# Patient Record
Sex: Male | Born: 1975
Health system: Southern US, Community
[De-identification: ages and names within clinical notes are randomized; demographics above are authoritative.]

## PROBLEM LIST (undated history)

## (undated) DIAGNOSIS — E785 Hyperlipidemia, unspecified: Secondary | ICD-10-CM

## (undated) DIAGNOSIS — I82409 Acute embolism and thrombosis of unspecified deep veins of unspecified lower extremity: Secondary | ICD-10-CM

## (undated) DIAGNOSIS — I824Y9 Acute embolism and thrombosis of unspecified deep veins of unspecified proximal lower extremity: Secondary | ICD-10-CM

## (undated) DIAGNOSIS — D6851 Activated protein C resistance: Secondary | ICD-10-CM

## (undated) DIAGNOSIS — I2510 Atherosclerotic heart disease of native coronary artery without angina pectoris: Secondary | ICD-10-CM

## (undated) DIAGNOSIS — E079 Disorder of thyroid, unspecified: Secondary | ICD-10-CM

## (undated) HISTORY — DX: Acute embolism and thrombosis of unspecified deep veins of unspecified proximal lower extremity: I82.4Y9

## (undated) HISTORY — DX: Activated protein C resistance: D68.51

## (undated) HISTORY — PX: CARDIAC CATHETERIZATION: SHX172

---

## 2011-04-18 ENCOUNTER — Emergency Department (HOSPITAL_COMMUNITY): Payer: Private Health Insurance - Indemnity

## 2011-04-18 ENCOUNTER — Other Ambulatory Visit (HOSPITAL_COMMUNITY): Payer: Self-pay

## 2011-04-18 ENCOUNTER — Emergency Department (HOSPITAL_COMMUNITY)
Admission: EM | Admit: 2011-04-18 | Discharge: 2011-04-19 | Disposition: A | Discharge status: Home / Self Care | Payer: Private Health Insurance - Indemnity | Attending: Emergency Medicine | Admitting: Emergency Medicine

## 2011-04-18 ENCOUNTER — Encounter (HOSPITAL_COMMUNITY): Payer: Self-pay | Admitting: Radiology

## 2011-04-18 DIAGNOSIS — M7989 Other specified soft tissue disorders: Secondary | ICD-10-CM | POA: Insufficient documentation

## 2011-04-18 DIAGNOSIS — M79609 Pain in unspecified limb: Secondary | ICD-10-CM

## 2011-04-18 DIAGNOSIS — R252 Cramp and spasm: Secondary | ICD-10-CM | POA: Insufficient documentation

## 2011-04-18 DIAGNOSIS — R262 Difficulty in walking, not elsewhere classified: Secondary | ICD-10-CM | POA: Insufficient documentation

## 2011-04-18 DIAGNOSIS — D6859 Other primary thrombophilia: Secondary | ICD-10-CM | POA: Insufficient documentation

## 2011-04-18 DIAGNOSIS — Z86718 Personal history of other venous thrombosis and embolism: Secondary | ICD-10-CM | POA: Insufficient documentation

## 2011-04-18 DIAGNOSIS — R0602 Shortness of breath: Secondary | ICD-10-CM | POA: Insufficient documentation

## 2011-04-18 DIAGNOSIS — R0989 Other specified symptoms and signs involving the circulatory and respiratory systems: Secondary | ICD-10-CM | POA: Insufficient documentation

## 2011-04-18 DIAGNOSIS — R0609 Other forms of dyspnea: Secondary | ICD-10-CM | POA: Insufficient documentation

## 2011-04-18 DIAGNOSIS — M25579 Pain in unspecified ankle and joints of unspecified foot: Secondary | ICD-10-CM | POA: Insufficient documentation

## 2011-04-18 HISTORY — DX: Acute embolism and thrombosis of unspecified deep veins of unspecified lower extremity: I82.409

## 2011-04-18 LAB — DIFFERENTIAL
Eosinophils Absolute: 0.1 10*3/uL (ref 0.0–0.7)
Eosinophils Relative: 1 % (ref 0–5)
Lymphs Abs: 2 10*3/uL (ref 0.7–4.0)
Monocytes Absolute: 1.2 10*3/uL — ABNORMAL HIGH (ref 0.1–1.0)
Monocytes Relative: 9 % (ref 3–12)

## 2011-04-18 LAB — POCT I-STAT, CHEM 8
Calcium, Ion: 1.2 mmol/L (ref 1.12–1.32)
HCT: 49 % (ref 39.0–52.0)
TCO2: 24 mmol/L (ref 0–100)

## 2011-04-18 LAB — PROTIME-INR: Prothrombin Time: 13.5 seconds (ref 11.6–15.2)

## 2011-04-18 LAB — CBC
MCH: 30.7 pg (ref 26.0–34.0)
MCV: 86.7 fL (ref 78.0–100.0)
Platelets: 180 10*3/uL (ref 150–400)
RDW: 12.8 % (ref 11.5–15.5)

## 2011-04-18 MED ORDER — IOHEXOL 300 MG/ML  SOLN
100.0000 mL | Freq: Once | INTRAMUSCULAR | Status: AC | PRN
  Administered 2011-04-18: 100 mL via INTRAVENOUS

## 2011-04-20 ENCOUNTER — Inpatient Hospital Stay (HOSPITAL_COMMUNITY)
Admission: EM | Admit: 2011-04-20 | Discharge: 2011-04-26 | DRG: 238 | Disposition: A | Discharge status: Home / Self Care | Payer: Private Health Insurance - Indemnity | Source: Ambulatory Visit | Attending: Family Medicine | Admitting: Family Medicine

## 2011-04-20 ENCOUNTER — Emergency Department (HOSPITAL_COMMUNITY): Payer: Private Health Insurance - Indemnity

## 2011-04-20 DIAGNOSIS — M79609 Pain in unspecified limb: Secondary | ICD-10-CM

## 2011-04-20 DIAGNOSIS — F17200 Nicotine dependence, unspecified, uncomplicated: Secondary | ICD-10-CM | POA: Diagnosis present

## 2011-04-20 DIAGNOSIS — D696 Thrombocytopenia, unspecified: Secondary | ICD-10-CM | POA: Diagnosis not present

## 2011-04-20 DIAGNOSIS — N5089 Other specified disorders of the male genital organs: Secondary | ICD-10-CM | POA: Diagnosis not present

## 2011-04-20 DIAGNOSIS — I824Y9 Acute embolism and thrombosis of unspecified deep veins of unspecified proximal lower extremity: Principal | ICD-10-CM | POA: Diagnosis present

## 2011-04-20 DIAGNOSIS — D62 Acute posthemorrhagic anemia: Secondary | ICD-10-CM | POA: Diagnosis not present

## 2011-04-20 DIAGNOSIS — I82220 Acute embolism and thrombosis of inferior vena cava: Secondary | ICD-10-CM | POA: Diagnosis present

## 2011-04-20 DIAGNOSIS — I871 Compression of vein: Secondary | ICD-10-CM | POA: Diagnosis present

## 2011-04-20 DIAGNOSIS — J9819 Other pulmonary collapse: Secondary | ICD-10-CM | POA: Diagnosis not present

## 2011-04-20 DIAGNOSIS — R58 Hemorrhage, not elsewhere classified: Secondary | ICD-10-CM | POA: Diagnosis not present

## 2011-04-20 DIAGNOSIS — D6859 Other primary thrombophilia: Secondary | ICD-10-CM | POA: Diagnosis present

## 2011-04-20 DIAGNOSIS — R17 Unspecified jaundice: Secondary | ICD-10-CM | POA: Diagnosis not present

## 2011-04-20 DIAGNOSIS — I824Z9 Acute embolism and thrombosis of unspecified deep veins of unspecified distal lower extremity: Secondary | ICD-10-CM

## 2011-04-20 DIAGNOSIS — R7309 Other abnormal glucose: Secondary | ICD-10-CM | POA: Diagnosis present

## 2011-04-20 LAB — MRSA PCR SCREENING: MRSA by PCR: NEGATIVE

## 2011-04-20 LAB — BASIC METABOLIC PANEL
CO2: 25 mEq/L (ref 19–32)
Chloride: 93 mEq/L — ABNORMAL LOW (ref 96–112)
Creatinine, Ser: 0.79 mg/dL (ref 0.50–1.35)
Potassium: 4.7 mEq/L (ref 3.5–5.1)

## 2011-04-20 LAB — CBC
HCT: 37 % — ABNORMAL LOW (ref 39.0–52.0)
MCHC: 35.9 g/dL (ref 30.0–36.0)
MCV: 85.3 fL (ref 78.0–100.0)
RDW: 12.5 % (ref 11.5–15.5)
WBC: 16.3 10*3/uL — ABNORMAL HIGH (ref 4.0–10.5)

## 2011-04-20 LAB — GLUCOSE, CAPILLARY: Glucose-Capillary: 108 mg/dL — ABNORMAL HIGH (ref 70–99)

## 2011-04-20 LAB — APTT: aPTT: 34 seconds (ref 24–37)

## 2011-04-20 MED ORDER — IOHEXOL 300 MG/ML  SOLN
200.0000 mL | Freq: Once | INTRAMUSCULAR | Status: AC | PRN
  Administered 2011-04-20: 80 mL via INTRAVENOUS

## 2011-04-20 NOTE — H&P (Signed)
NAMEJA, OHMAN              ACCOUNT NO.:  [id number]  MEDICAL RECORD NO.:  [id number]  LOCATION:  [facility]                         FACILITY:  [facility]  PHYSICIAN:  Andreas Blower, MD       DATE OF BIRTH:  22-Mar-1976  DATE OF ADMISSION:  04/20/2011 DATE OF DISCHARGE:                             HISTORY & PHYSICAL   PRIMARY CARE PHYSICIAN:  The patient does not have one.  CHIEF COMPLAINT:  Bilateral lower extremity swelling, right greater than left.  HISTORY OF PRESENT ILLNESS:  Mr. Trentin Knappenberger is a 35 year old Caucasian male with history of tobacco use, history of factor V Leiden mutation and May-Thurner syndrome.  He has had a history of stents placed in both of his groin in [place] 6 years ago and has had complicated thrombectomy and/or thrombolysis done in the past.  The patient completed 4 or 92-month course of Coumadin post procedure and subsequently since then has not been on any anticoagulation.  He reports that his most recent symptoms started about 5 days ago.  Initially, he has noted swelling and pain in his lower extremities bilaterally.  He presented to the ER on April 18, 2011 and had ultrasound of lower extremities which did not show DVT.  He also had CT of the chest with contrast, which was negative for CTA and was discharged home.  The patient had gone to [facility] and had ultrasound done today which showed DVT in the common femoral and saphenous veins site.  As a result, he presented back to the ER.  The patient denies any recent fevers or chills.  Denies any nausea or vomiting.  Denies any chest pain or shortness of breath.  Denies any abdominal pain, diarrhea, headaches or vision changes.  REVIEW OF SYSTEMS:  All systems were reviewed with the patient was positive as per HPI, otherwise all other systems are negative.  PAST MEDICAL HISTORY: 1. History of deep venous thrombosis with history of stent placement     bilaterally in the femoral iliac vein and has had  thrombectomy and     thrombolysis in the past. 2. History of factor V Leiden mutation. 3. History of May-Thurner syndrome. 4. History of knee surgery, history of nasal septoplasty.  SOCIAL HISTORY:  The patient reports that he smokes on the weekend socially while drinking alcohol.  Drinks occasional alcohol on the weekends.  Currently, he is working for [employer].  FAMILY HISTORY:  Significant for father and sister having factor V Leiden mutation.  PHYSICAL EXAM:  VITAL SIGNS:  Temperature 98.8, blood pressure 139/80, heart rate 105, respirations 20, satting at 98% on room air. GENERAL:  The patient was alert and oriented, cooperative, in no acute distress, was lying in bed comfortably. HEENT:  Extraocular motions are intact.  Pupils equal and round.  Had moist mucous membranes. NECK:  Supple. HEART:  Regular with S1 and S2. LUNGS:  Clear to auscultation bilaterally. ABDOMEN:  Soft, nontender, nondistended.  Positive bowel sounds. EXTREMITIES:  The patient with good pulses.  Had swelling in lower extremities bilaterally which is more significant around his thighs. Has increased swelling in his right greater than left. NEURO:  Cranial nerves  II-XII grossly intact at 5/5 motor strength in upper as well as lower extremities.  LABS:  Pending at this time.  CBC from April 18, 2011 showed a white count of 12.9, hemoglobin 16.7, hematocrit 49.0, platelet count 180. Electrolytes normal with a BUN 23, creatinine 1.20.  CK was 618.  ASSESSMENT AND PLAN: 1. Deep venous thrombosis.  Imaging from outside source showed DVT in     bilateral common femoral and saphenous vein sites.  Dr. Imogene Burn with     Vascular Surgery is involved and Interventional Radiology is     involved with the patient's care.  We will start the patient on     heparin.  We will put the patient on tele.  Vascular Surgery and/or     Interventional Radiology contemplating further intervention based     on repeat  complete ultrasound which is to be done later today. 2. History of deep venous thrombosis with factor V Leiden mutation,     suspect the patient will most likely need lifelong anticoagulation,     on Coumadin given he has had repeat episodes of deep venous     thrombosis. 3. Leukocytosis, likely reactive leukocytosis. 4. Mild hyperglycemia.  We will send for hemoglobin A1c. 5. Prophylaxis will be on heparin for DVT prophylaxis. 6. Code status.  The patient is full code.   Andreas Blower, MD  Addendum: Repeat ultrasound showed extensive bilateral DVT. Interventional radiology has made arrangements to have the patient be transfered to ICU for closer monitoring.   SR/MEDQ  D:  04/20/2011  T:  04/20/2011  Job:  [id number]  Electronically Signed by Wardell Heath Tonja Jezewski  on 04/20/2011 11:42:30 PM

## 2011-04-21 ENCOUNTER — Inpatient Hospital Stay (HOSPITAL_COMMUNITY): Payer: Private Health Insurance - Indemnity

## 2011-04-21 DIAGNOSIS — I82409 Acute embolism and thrombosis of unspecified deep veins of unspecified lower extremity: Secondary | ICD-10-CM

## 2011-04-21 DIAGNOSIS — I824Z9 Acute embolism and thrombosis of unspecified deep veins of unspecified distal lower extremity: Secondary | ICD-10-CM

## 2011-04-21 LAB — URINE MICROSCOPIC-ADD ON

## 2011-04-21 LAB — CBC
Hemoglobin: 11 g/dL — ABNORMAL LOW (ref 13.0–17.0)
Hemoglobin: 8.8 g/dL — ABNORMAL LOW (ref 13.0–17.0)
MCHC: 34.9 g/dL (ref 30.0–36.0)
MCV: 84.8 fL (ref 78.0–100.0)
Platelets: 86 10*3/uL — ABNORMAL LOW (ref 150–400)
Platelets: 89 10*3/uL — ABNORMAL LOW (ref 150–400)
RBC: 2.82 MIL/uL — ABNORMAL LOW (ref 4.22–5.81)
RDW: 12.6 % (ref 11.5–15.5)
WBC: 14.8 10*3/uL — ABNORMAL HIGH (ref 4.0–10.5)

## 2011-04-21 LAB — PROTIME-INR
INR: 1.74 — ABNORMAL HIGH (ref 0.00–1.49)
Prothrombin Time: 20.7 seconds — ABNORMAL HIGH (ref 11.6–15.2)
Prothrombin Time: 17.4 seconds — ABNORMAL HIGH (ref 11.6–15.2)

## 2011-04-21 LAB — HEPATIC FUNCTION PANEL
ALT: 38 U/L (ref 0–53)
AST: 26 U/L (ref 0–37)
Albumin: 3 g/dL — ABNORMAL LOW (ref 3.5–5.2)
Alkaline Phosphatase: 69 U/L (ref 39–117)
Indirect Bilirubin: 1.6 mg/dL — ABNORMAL HIGH (ref 0.3–0.9)
Total Protein: 6.4 g/dL (ref 6.0–8.3)

## 2011-04-21 LAB — URINALYSIS, ROUTINE W REFLEX MICROSCOPIC
Glucose, UA: NEGATIVE mg/dL
Protein, ur: 100 mg/dL — AB
Specific Gravity, Urine: 1.036 — ABNORMAL HIGH (ref 1.005–1.030)
pH: 6 (ref 5.0–8.0)

## 2011-04-21 LAB — BASIC METABOLIC PANEL
CO2: 26 mEq/L (ref 19–32)
Chloride: 95 mEq/L — ABNORMAL LOW (ref 96–112)
Glucose, Bld: 138 mg/dL — ABNORMAL HIGH (ref 70–99)
Potassium: 4.3 mEq/L (ref 3.5–5.1)
Sodium: 131 mEq/L — ABNORMAL LOW (ref 135–145)

## 2011-04-21 LAB — HEPARIN LEVEL (UNFRACTIONATED)
Heparin Unfractionated: <0.1 IU/mL — ABNORMAL LOW (ref 0.30–0.70)
Heparin Unfractionated: 0.25 IU/mL — ABNORMAL LOW (ref 0.30–0.70)

## 2011-04-21 MED ORDER — IOHEXOL 300 MG/ML  SOLN
200.0000 mL | Freq: Once | INTRAMUSCULAR | Status: AC | PRN
  Administered 2011-04-21: 120 mL via INTRAVENOUS

## 2011-04-21 NOTE — Consult Note (Signed)
Randy Cannon, Randy Cannon              ACCOUNT NO.:  [id number]  MEDICAL RECORD NO.:  [id number]  LOCATION:  2102                         FACILITY:  [facility]  PHYSICIAN:  Fransisco Hertz, MD       DATE OF BIRTH:  May 29, 1976  DATE OF CONSULTATION: DATE OF DISCHARGE:                                CONSULTATION   CONSULTATION REQUESTED BY:  [facility] ER staff.  REASON FOR CONSULTATION:  Bilateral iliofemoral DVT.  HISTORY OF PRESENT ILLNESS:  This is a 35 year old gentleman with prior history of iliofemoral DVT that was treated with lysis and placement of a, by report, bilateral iliac vein stents for treatment of May-Thurner syndrome.  He also has a known history of factor V Leiden.  He underwent a total of about a 4-64-month course of Coumadin and then was subsequently released from anticoagulation by his nephrologist.  He notes onset of pain in the area of his pelvis about Tuesday, and he was seen at the ER, had an ultrasound done which did not include the iliac segments.  This did not demonstrate any deep vein thrombosis. This Wednesday morning, started developing some numbness in his feet. Over the next few days, his symptomatology progressed.  He went in to an urgent care center.  Ultrasound was repeated at that point.  This, at this point, demonstrated DVT.  Subsequently, he was referred back to the ER for evaluation.  Today's ER duplexes are consistent with thrombosis of his common femoral veins bilaterally and superficial veins on both sides.  The patient denies any rest pain symptomatology or symptomatology consistent with intermittent claudication.  He notes that since Wednesday, he has had some slight improvement in the numbness in his right foot.  He is still able to move both feet.  He does not note any other associated symptomatology.  His risk factors for thrombophilia include factor V Leiden and also intermittent smoking.  PAST MEDICAL HISTORY: 1. History of bilateral  iliofemoral DVT. 2. Factor V Leiden. 3. May-Thurner syndrome.  Past surgical history includes, by history, a history of bilateral iliac vein stent placement and thrombolysis of his iliac vein system.  He has also previously had a knee surgery and also nasal septoplasty.  SOCIAL HISTORY:  He intermittently smokes on the weekend socially, also drinks socially.  Denies any illicit drug use.  FAMILY HISTORY:  Father and sister have factor V Leiden.  Otherwise, no other significant family history noted by the patient.  MEDICATIONS:  None.  He has no known drug allergies.  REVIEW OF SYSTEMS:  As above, otherwise noted to be negative.  PHYSICAL EXAMINATION:  VITAL SIGNS:  He had a blood pressure of 138/78, a heart rate of 72, respirations were 12. GENERAL:  Well developed, well nourished, no apparent distress. HEAD:  Normocephalic, atraumatic. ENT:  Hearing is grossly intact.  Oropharynx is without any erythema or exudate.  Nares without any drainage or any erythema.  On eye exam, pupils were equal, round, and reactive to light.  Extraocular movements were intact. NECK:  Supple neck without any nuchal rigidity or JVD. PULMONARY:  Symmetric expansion.  Good air movement.  No rales, rhonchi, or wheezing. CARDIAC:  Regular rate and rhythm.  Normal S1 and S2.  No murmurs, rubs, thrills, or gallops. VASCULAR:  Easily palpable upper extremity pulses, easily palpable carotid pulses without any bruits.  Abdominal aorta pulse could not be palpated.  Bilateral femorals were easily palpable.  I did not attempt to palpate the popliteals as there are currently popliteal vein sheaths in place.  I do not feel a palpable pulse in the right foot.  The left foot has easily palpable dorsalis pedis. ABDOMEN:  Soft abdomen.  Mild bilateral lower quadrant tenderness.  No guarding or rebound.  No hepatosplenomegaly.  Bowel sounds appear to be normal. MUSCULOSKELETAL:  Upper extremity strength was 5/5.   Lower extremities, I did not fully test given the presence of sheaths with lytic catheters in place in the pop space.  He is able to wiggle his toes bilaterally. On extremity examination, his right foot is somewhat cyanotic.  The left is slightly cyanotic also.  Right foot is cooler than the left foot. Both legs are edematous without any change in skin appearance consistent with venous hypertension. NEUROLOGIC:  His cranial nerves II through XII were intact.  Motor strength is listed above.  Sensation is grossly intact in upper extremities and the lower extremities has some decreased light touch in her right leg greater than the left. PSYCHIATRIC:  Judgment is intact.  Mood and affect were appropriate with his clinical situation. DERMATOLOGIC:  I did not appreciate any rashes elsewhere on his bodies. His extremities were as described above. LYMPHATICS:  There was no cervical, axillary, or inguinal lymphadenopathy  noted.  LABORATORY STUDIES:  CBC; white count of 16.3 with an H and H of 13.3 and 37.0, platelet counts of 113.  He had a chemistry; sodium 129, potassium 4.7, chloride 93, bicarb 25, glucose 112, BUN 23, creatinine 0.79.  He had a PTT of 34, PT of 15.1 and INR corresponding of 1.17.  NONINVASIVE VASCULAR IMAGING:  I finalized the report on his bilateral lower extremity venous duplexes.  This demonstrates thrombus throughout his femoral, popliteal, and tibial system with also concomitant thrombosis into his saphenous system.  MEDICAL DECISION MAKING:  This is a 35 year old patient with  bilateral iliofemoral deep vein thrombosis in the setting of thrombophilia and previous possible bilateral iliac stents for May-Thurner syndrome. Currently, he does not have any frank signs of venous gangrene, but does have some evidence of phlegmasia to varying degrees in both legs.  When I was called initially on this patient, I immediately recommended based on his history that he be  evaluated by interventional radiology with the plan for a bilateral popliteal vein cannulation, possible pharmaco- mechanical thrombolysis in both legs, and diagnostic venography to see also if there is a residual venous stenosis on both sides after clot debulking.  He may also need a placement of inferior vena caval filter followed by possible bilateral thrombolytic catheter placement.  Since my initial recommendation, he has gone on to have this procedure done. They elected to proceed with placement of lytic catheter and then tomorrow possible pharmacomechanical thrombolysis.  Currently, the venography I reviewed, it does demonstrate that the renal veins appear to be open and a clot extends to just below this level.  He does have a IVC filter and what also appears to be a possible right gonadal IVC filter placed.  On imaging, I only see clearly a left iliac stent placed which consists of 2 overlapping stents.  I do not clearly see a right iliac stent placement  in this patient.  Given his findings on examination, I agree that aggressive treatment in this patient will be necessary to avoid development of any type of venous gangrene.  My expectation is with additional lysis, he should regain all of his pulses in both feet and the cyanosis should resolve.  An alternative option in this patient would be surgical venous thrombectomy which has fallen out of favor given the significant morbidity related to that procedure.  We discussed the plan briefly with this patient and he is in agreement with such.  We will continue to follow along to provide any surgical support as necessary in this case.  Given that this is this is the patient's second episode of iliofemoral deep vein thrombosis and he has a known history of factor V Leiden, at this point, he will likely require lifelong anticoagulation.  Recommend also involvement of a hematologist to help with long-term management of this patient's  thrombophilia.     Fransisco Hertz, MD     BLC/MEDQ  D:  04/20/2011  T:  04/21/2011  Job:  [id number]  Electronically Signed by Leonides Sake MD on 04/21/2011 07:08:39 AM

## 2011-04-22 ENCOUNTER — Inpatient Hospital Stay (HOSPITAL_COMMUNITY): Payer: Private Health Insurance - Indemnity

## 2011-04-22 DIAGNOSIS — I824Z9 Acute embolism and thrombosis of unspecified deep veins of unspecified distal lower extremity: Secondary | ICD-10-CM

## 2011-04-22 LAB — CBC
HCT: 21.4 % — ABNORMAL LOW (ref 39.0–52.0)
HCT: 20.7 % — ABNORMAL LOW (ref 39.0–52.0)
HCT: 22 % — ABNORMAL LOW (ref 39.0–52.0)
Hemoglobin: 7.8 g/dL — ABNORMAL LOW (ref 13.0–17.0)
Hemoglobin: 7.9 g/dL — ABNORMAL LOW (ref 13.0–17.0)
MCH: 30.6 pg (ref 26.0–34.0)
MCV: 84.1 fL (ref 78.0–100.0)
RBC: 2.55 MIL/uL — ABNORMAL LOW (ref 4.22–5.81)
RBC: 2.46 MIL/uL — ABNORMAL LOW (ref 4.22–5.81)
WBC: 11.1 10*3/uL — ABNORMAL HIGH (ref 4.0–10.5)
WBC: 10.5 10*3/uL (ref 4.0–10.5)

## 2011-04-22 LAB — COMPREHENSIVE METABOLIC PANEL
BUN: 19 mg/dL (ref 6–23)
CO2: 29 mEq/L (ref 19–32)
Chloride: 97 mEq/L (ref 96–112)
Creatinine, Ser: 0.65 mg/dL (ref 0.50–1.35)
GFR calc Af Amer: >60 mL/min (ref >60)
GFR calc non Af Amer: >60 mL/min (ref >60)
Glucose, Bld: 98 mg/dL (ref 70–99)
Total Bilirubin: 0.6 mg/dL (ref 0.3–1.2)

## 2011-04-22 LAB — HEPARIN LEVEL (UNFRACTIONATED)
Heparin Unfractionated: 0.32 IU/mL (ref 0.30–0.70)
Heparin Unfractionated: <0.1 IU/mL — ABNORMAL LOW (ref 0.30–0.70)
Heparin Unfractionated: <0.1 IU/mL — ABNORMAL LOW (ref 0.30–0.70)

## 2011-04-22 LAB — PREPARE RBC (CROSSMATCH)

## 2011-04-22 LAB — ABO/RH: ABO/RH(D): A POS

## 2011-04-22 LAB — PROTIME-INR: Prothrombin Time: 15.2 seconds (ref 11.6–15.2)

## 2011-04-23 ENCOUNTER — Inpatient Hospital Stay (HOSPITAL_COMMUNITY): Payer: Private Health Insurance - Indemnity

## 2011-04-23 DIAGNOSIS — I82409 Acute embolism and thrombosis of unspecified deep veins of unspecified lower extremity: Secondary | ICD-10-CM

## 2011-04-23 LAB — COMPREHENSIVE METABOLIC PANEL
ALT: 30 U/L (ref 0–53)
AST: 24 U/L (ref 0–37)
Albumin: 2.6 g/dL — ABNORMAL LOW (ref 3.5–5.2)
Alkaline Phosphatase: 82 U/L (ref 39–117)
Calcium: 9.1 mg/dL (ref 8.4–10.5)
Glucose, Bld: 89 mg/dL (ref 70–99)
Potassium: 4 mEq/L (ref 3.5–5.1)
Sodium: 138 mEq/L (ref 135–145)
Total Protein: 5.9 g/dL — ABNORMAL LOW (ref 6.0–8.3)

## 2011-04-23 LAB — CROSSMATCH: Unit division: 0

## 2011-04-23 LAB — CBC
HCT: 23.3 % — ABNORMAL LOW (ref 39.0–52.0)
HCT: 25.5 % — ABNORMAL LOW (ref 39.0–52.0)
Hemoglobin: 8.3 g/dL — ABNORMAL LOW (ref 13.0–17.0)
Hemoglobin: 8.4 g/dL — ABNORMAL LOW (ref 13.0–17.0)
Hemoglobin: 9.1 g/dL — ABNORMAL LOW (ref 13.0–17.0)
MCH: 29.7 pg (ref 26.0–34.0)
MCH: 29.9 pg (ref 26.0–34.0)
MCHC: 35.6 g/dL (ref 30.0–36.0)
MCHC: 35.4 g/dL (ref 30.0–36.0)
MCV: 83.9 fL (ref 78.0–100.0)
Platelets: 144 10*3/uL — ABNORMAL LOW (ref 150–400)
Platelets: 187 10*3/uL (ref 150–400)
RBC: 2.83 MIL/uL — ABNORMAL LOW (ref 4.22–5.81)
RBC: 3.04 MIL/uL — ABNORMAL LOW (ref 4.22–5.81)
RDW: 14.2 % (ref 11.5–15.5)
WBC: 9.8 10*3/uL (ref 4.0–10.5)
WBC: 11.2 10*3/uL — ABNORMAL HIGH (ref 4.0–10.5)

## 2011-04-23 LAB — PROTIME-INR
INR: 0.99 (ref 0.00–1.49)
Prothrombin Time: 13.3 seconds (ref 11.6–15.2)

## 2011-04-23 LAB — HEPARIN LEVEL (UNFRACTIONATED): Heparin Unfractionated: <0.1 IU/mL — ABNORMAL LOW (ref 0.30–0.70)

## 2011-04-24 LAB — BASIC METABOLIC PANEL
BUN: 12 mg/dL (ref 6–23)
GFR calc Af Amer: >60 mL/min (ref >60)
GFR calc non Af Amer: >60 mL/min (ref >60)
Potassium: 3.7 mEq/L (ref 3.5–5.1)

## 2011-04-24 LAB — CBC
HCT: 26.6 % — ABNORMAL LOW (ref 39.0–52.0)
MCHC: 34.2 g/dL (ref 30.0–36.0)
Platelets: 183 10*3/uL (ref 150–400)
RDW: 14.4 % (ref 11.5–15.5)
WBC: 11.5 10*3/uL — ABNORMAL HIGH (ref 4.0–10.5)

## 2011-04-24 LAB — URINE CULTURE
Colony Count: NO GROWTH
Culture  Setup Time: 201207230121
Culture: NO GROWTH

## 2011-04-24 LAB — PROTIME-INR: INR: 1.03 (ref 0.00–1.49)

## 2011-04-24 LAB — HEPARIN LEVEL (UNFRACTIONATED): Heparin Unfractionated: 0.32 IU/mL (ref 0.30–0.70)

## 2011-04-25 LAB — BASIC METABOLIC PANEL
CO2: 24 mEq/L (ref 19–32)
Calcium: 9.4 mg/dL (ref 8.4–10.5)
Creatinine, Ser: 0.68 mg/dL (ref 0.50–1.35)
Glucose, Bld: 94 mg/dL (ref 70–99)

## 2011-04-25 LAB — CBC
Hemoglobin: 9.1 g/dL — ABNORMAL LOW (ref 13.0–17.0)
MCH: 28.7 pg (ref 26.0–34.0)
MCHC: 33.7 g/dL (ref 30.0–36.0)
MCV: 85.2 fL (ref 78.0–100.0)
Platelets: 206 10*3/uL (ref 150–400)
RBC: 3.17 MIL/uL — ABNORMAL LOW (ref 4.22–5.81)

## 2011-04-25 LAB — PROTIME-INR
INR: 1.07 (ref 0.00–1.49)
Prothrombin Time: 14.1 seconds (ref 11.6–15.2)

## 2011-04-26 LAB — PROTIME-INR
INR: 1 (ref 0.00–1.49)
Prothrombin Time: 13.4 seconds (ref 11.6–15.2)

## 2011-04-26 LAB — CBC
Hemoglobin: 10 g/dL — ABNORMAL LOW (ref 13.0–17.0)
Platelets: 224 10*3/uL (ref 150–400)
RBC: 3.49 MIL/uL — ABNORMAL LOW (ref 4.22–5.81)
WBC: 9.4 10*3/uL (ref 4.0–10.5)

## 2011-04-26 LAB — HEPARIN ANTI-XA: Heparin LMW: 0.81 IU/mL

## 2011-04-26 LAB — HEPARIN LEVEL (UNFRACTIONATED): Heparin Unfractionated: 0.28 IU/mL — ABNORMAL LOW (ref 0.30–0.70)

## 2011-04-26 NOTE — Discharge Summary (Signed)
  NAMEROSARIO, KUSHNER              ACCOUNT NO.:  [id number]  MEDICAL RECORD NO.:  [id number]  LOCATION:  4507                         FACILITY:  [facility]  PHYSICIAN:  Pleas Koch, MD        DATE OF BIRTH:  [birth year]  DATE OF ADMISSION:  04/20/2011 DATE OF DISCHARGE:  04/26/2011                              DISCHARGE SUMMARY   ADDENDUM:  This is an addendum to dictation #[id number].  DISCHARGE MEDICATIONS: 1. Arixtra 10 mg q.24 hourly, the patient is suppose to be on this for     60 days and as per Dr. Myna Hidalgo. 2. Keflex 500 mg b.i.d. for the next 2 days 4 tablets prescribed. 3. The patient given limited prescription of hydrocodone/APAP 1 tablet     q.6 h. p.r.n. 5 days, 20 tablets prescribed. 4. Discontinued ibuprofen. 5. The patient will continue glucosamine.  The patient was seen from July 24 to April 26, 2011, was doing.  His DVTs were stable and his hemoglobin remained above 99.5 despite being on low heparin.  Heparin was transitioned to Arixtra and the patient was able to give this himself.  He did get preauthorization from Dr. Gustavo Lah office and follow up with a doctor for further care.  The patient was seen on day of discharge, he still having mild abdominal pain but had no other issues.  Temperature 97.3, pulse 80, respirations 20, blood pressure is 127/76, and satting 95% on room.  The patient will follow up with Dr. Myna Hidalgo for further workup to complete empiric treatment of Ancef for two more days 500 b.i.d. and once that is complete, he can discontinue the same, he can continue hydrocodone for abdominal pain secondary to retroperitoneal hematoma and the patient will be reviewed. The patient encouraged to get a PCP.  His chest was clear.  Abdomen is soft, likely tender, lower extremity edema is decreased.          ______________________________ Pleas Koch, MD     JS/MEDQ  D:  04/26/2011  T:  04/26/2011  Job:  [id number]  Electronically Signed by Pleas Koch MD on  04/26/2011 04:45:04 PM

## 2011-05-01 ENCOUNTER — Ambulatory Visit: Payer: Private Health Insurance - Indemnity | Admitting: Hematology & Oncology

## 2011-05-04 NOTE — Progress Notes (Signed)
NAMEQUAN, CYBULSKI              ACCOUNT NO.:  [id number]  MEDICAL RECORD NO.:  [id number]  LOCATION:  3316                         FACILITY:  [facility]  PHYSICIAN:  Osvaldo Shipper, MD     DATE OF BIRTH:  01-Jun-1976                                PROGRESS NOTE   The patient does not have a primary care physician.  CONSULTATIONS DURING THIS HOSPITALIZATION: Include 1. Josph Macho, MD with Hematology. 2. Vascular Surgery Dr. Leonides Sake as well as Dr. Myra Gianotti. 3. Phone discussion with Dr. Lorin Picket McDiarmid, urologist.  Ardine Bjork STUDIES: Imaging studies done so far during this hospitalization include 1. Chest x-ray which showed low lung volumes with bibasilar     atelectasis, right greater than left. 2. CT of the abdomen and pelvis on April 22, 2011, which showed mild     bilateral retroperitoneal hemorrhage, right greater than left. 3. Ultrasound of the scrotum done on April 23, 2011, which showed     edematous scrotal sac, small right epididymal cyst one of which     appears complex, was a area posterior to the epididymal head which     was thought to be edematous spermatic cord rather than a hematoma.     No obvious hematoma was otherwise noted on this ultrasound. 4. Venous Doppler study showed acute DVTs in bilateral lower     extremities basically from the common femoral, femoral-popliteal,     posterior tibial-peroneal, saphenofemoral junction on the right and     then on the left common femoral, femoral-popliteal, saphenofemoral     and greater saphenous vein on the left.  Interestingly on April 12, 2011, he did not have any evidence for DVT.  PERTINENT LABS: Pertinent labs during this hospitalization include a white cell count of 16,000 at presentation increasing to 19,000 and currently is 11,000, hemoglobin went from 13.3-7.4.  PT/INR was stable.  Fibrinogen levels did drop to less than 60 while he was on tenecteplase infusion.  His sodium levels were in 29 which was  corrected.  His UA was remarkably abnormal, however, his urine cultures are negative or showing no growth father.  DISCHARGE DIAGNOSES: 1. Bilateral lower extremity deep venous thrombosis in the setting of     factor V Leiden as well as May-Thurner syndrome. 2. Requires treatment with Arixtra. 3. Acute blood loss anemia, status post transfusion, now stable. 4. Abnormal UA, currently on antibiotics. 5. Atelectasis, on intensive spirometry. 6. Transient thrombocytopenia, improved. 7. Mild jaundice, stable.  BRIEF HOSPITAL COURSE.: 1. Acute bilateral DVTs.  This is a 35 year old Caucasian male with     past medical history of factor V Leiden who had a DVT about 6 years     ago.  The patient was on Coumadin at that time and it was     discontinued after 6 months of treatment.  He was being followed by     hematologist in [place], [place].  He came in with complaints of pain     in his legs and his lower abdomen.  Initial evaluation did not     reveal any DVTs, however, within a couple of days he was found to  have thrombosed veins in his lower extremities.  The patient was     started on heparin.  Because of the extensive disease Vascular     Surgery was consulted.  They deferred to Interventional Radiology.     The patient had catheters placed into the veins by Interventional     Radiology and tenecteplase infusions were given.  Subsequently, he     underwent repeat venogram and angioplasty done by Interventional     Radiology.  He subsequently has TED stockings.  He has been seen by     Dr. Arlan Organ and the plan is for outpatient Arixtra which is     being arranged at this time.  Heparin should be continued through     Wednesday and then Arixtra should be given and hopefully if that is     a range the patient should be able to go home on Thursday. 2. Acute blood loss anemia, because of the tenecteplase, patient's     fibrinogen level dropped, he started having hematuria, his      hemoglobin dropped.  CT revealed acute retroperitoneal bleed.     However, the patient did not become hemodynamically unstable.  He     was given transfusions.  His hemoglobin responded once he was taken     off of the TNK infusion, his blood counts stabilized. 3. Scrotal edema and swelling.  Scrotum started showing bruising and     it started swelling.  We obtained ultrasound of the scrotum and no     obvious hematoma was seen.  I discussed with Dr. McDiarmid,     urologist, yesterday and he said there should not be any long-term     damage.  He asked me to give the patient his contact information to     follow with him as an outpatient. 4. Atelectasis.  He is doing incentive spirometry and he is     ambulating, so that should improve. 5. He had mild thrombocytopenia, which has improved. 6. He had abnormal UA at the time of admission and because of the     elevated white cell count, he was started on ceftriaxone.  His     urine cultures have not shown any growth.  We will change him to     Keflex for three more days orally. 7. He has mild jaundice.  He had mildly elevated bilirubin which has     corrected.  On April 24, 2011, that is today, the patient is feeling well.  He is ambulated.  He has some soreness in his scrotum, otherwise he denies any other discomfort.  VITAL SIGNS:  Are all pretty much stable.  He is afebrile.  His blood pressure is 137/68, saturating 98% on room air.  He is afebrile as I said.  His heart rate is 80-100. LUNGS:  Clear to auscultation bilaterally.  No wheezing, rales or rhonchi. CARDIOVASCULAR:  S1 and S2 is normal and regular.  No S3, S4, rubs, murmurs or bruits. ABDOMEN:  Soft.  There is some tenderness in the lower abdomen.  He has got some bruising in the right flank area. GU:  He has got enlarged scrotum.  Testes appear to be in normal size. No obvious tenderness is present.  No obvious fluid collection is palpable.  He is able to urinate  normally. NEUROLOGIC:  He is alert, oriented x3.  No focal neurological deficits are present.  His hemoglobin this morning is 9.1 which is stable.  INR  is 1.03.  His electrolytes are all normal.  So, essentially the patient is stable.  He can be moved to a non-tele floor.  He will need heparin infusion until Wednesday and then on Thursday he will need to get Arixtra.  Case management is working on this at this time.  Arlan Organ, hematologist is also working on this at this time.  Potentially, the earliest he can be discharged this Thursday, but he may have to wait for Friday depending on how the above arrangements work out.  Discharge medications etc., instructions will be provided at the time of discharge.  Osvaldo Shipper, MD     GK/MEDQ  D:  04/24/2011  T:  04/24/2011  Job:  [id number]  Electronically Signed by Osvaldo Shipper MD on 05/04/2011 06:52:35 AM

## 2011-05-09 ENCOUNTER — Other Ambulatory Visit: Payer: Self-pay | Admitting: Hematology & Oncology

## 2011-05-09 ENCOUNTER — Ambulatory Visit (HOSPITAL_BASED_OUTPATIENT_CLINIC_OR_DEPARTMENT_OTHER): Payer: Private Health Insurance - Indemnity | Admitting: Hematology & Oncology

## 2011-05-09 DIAGNOSIS — I824Y9 Acute embolism and thrombosis of unspecified deep veins of unspecified proximal lower extremity: Secondary | ICD-10-CM

## 2011-05-09 LAB — CBC WITH DIFFERENTIAL (CANCER CENTER ONLY)
BASO%: 0.7 % (ref 0.0–2.0)
EOS%: 2.6 % (ref 0.0–7.0)
LYMPH#: 2 10*3/uL (ref 0.9–3.3)
MCHC: 34.1 g/dL (ref 32.0–35.9)
MONO#: 0.6 10*3/uL (ref 0.1–0.9)
NEUT#: 3.4 10*3/uL (ref 1.5–6.5)
NEUT%: 54.7 % (ref 40.0–80.0)
Platelets: 325 10*3/uL (ref 145–400)
RDW: 14.3 % (ref 11.1–15.7)
WBC: 6.1 10*3/uL (ref 4.0–10.0)

## 2011-05-10 LAB — CHROMOGENIC FACTOR X: CHROM XA: 149.6 % — ABNORMAL HIGH (ref 86–146)

## 2011-05-10 LAB — D-DIMER, QUANTITATIVE: D-Dimer, Quant: 3.07 ug/mL-FEU — ABNORMAL HIGH (ref 0.00–0.48)

## 2011-05-21 NOTE — Consult Note (Signed)
NAMEJAKEL, ALPHIN              ACCOUNT NO.:  [id number]  MEDICAL RECORD NO.:  [id number]  LOCATION:  2102                         FACILITY:  [facility]  PHYSICIAN:  Josph Macho, M.D.  DATE OF BIRTH:  [birth year]  DATE OF CONSULTATION:  04/21/2011 DATE OF DISCHARGE:                                CONSULTATION   REFERRING PHYSICIAN:  Hollice Espy, MD  REASON FOR CONSULTATION: 1. Extensive bilateral iliofemoral DVT. 2. History of factor V Leiden mutation.  HISTORY OF PRESENT ILLNESS:  [name] is a very nice 35 year old white gentleman.  He moved up here in December from [place], who has run [employer].  He presented down in [place] with DVT back in 2006.  He was known to have a May-Thurner syndrome.  He I think had a stent placed to help of with this.  He also was found to have factor 5 Leiden mutation.  Not sure if he is heterozygous or homozygous.  His sister also has factor 5 Leiden mutation.  His father also had factor 5 Leiden mutation.  He underwent about 4-5 months of Coumadin and was then follow supportively.  He again come up here since December.  He is working long hours.  He is to start exercising.  He has not take any kind of supplements.  He does smoke "socially."  He had a Doppler done on April 20, 2011.  The Doppler showed an extensive acute deep or superficial vein thrombosis involved in the right lower extremity.  He also had wound in the left lower extremity.  He did undergo a CT angio of the chest on April 18, 2011.  This was negative.  He was admitted.  On admission, his platelet count was 113,000.  His white cell count 16 and hemoglobin 13.3.  On April 18, 2011, his white count was 12.9, hemoglobin 15.3, hematocrit 43.2, platelet count 180.  He had a markedly elevated creatine kinase level of 618.  He had normal PT and PTT.  BUN and creatinine were 23 and 0.8.  On April 20, 2011, he was started on thrombolytic therapy  by Interventional Radiology.  He was given TNK.  He was seen by Dr. Leonides Sake of Vascular Surgery.  Dr. Imogene Burn, as usual, did a very thorough workup.  He did not feel that there is any indication for surgical intervention.  He agree with the plan for thrombolytic therapy.  On view of his venogram, [name] was noted to have an IVC filter. He was noted to have a left iliac stent.  He did not see a right iliac stent.  [name] has noted improvement already.  His legs did not feels heavier or swollen.  He underwent a venogram today.  Dr. Fredia Sorrow did this.  He was found to have partial clot lysis bilaterally.  He had further decrease in clot burden after extensive AngioJet thrombectomy.  He had a 10-mm and 12-mm balloon angioplasty performed bilaterally at iliac veins.  The final venogram showed antegrade flow bilaterally.  Both iliac veins were opened with mild stenosis.  Again, there were some residual nonocclusive thrombus in the right common femoral veins and iliac  veins.  The TNK therapy was discontinued secondary to drop in his fibrinogen to less than 60.  On his CBC today, his platelet count is down to 86,000, white cell count 19, hemoglobin 11.  Now, we were subsequently asked to see him for evaluation of long-term anticoagulation.  Again, he says he does feel better.  He is back on heparin.  He has had no bleeding.  He has had no cough or shortness of breath.  He has not noticed any abdominal discomfort.  He has had improvement in his leg swelling.  His past medical history is unremarkable outside as the past DVT secondary to the May-Thurner syndrome and factor 5 Leiden mutation.  He has had history of knee surgery, I think with his right knee.  History of nasal septoplasty.  His allergies are none.  His admission medications were none.  SOCIAL HISTORY:  He does have "socially" tobacco use.  He has social alcohol use.  Again, he works full-time for [employer].  FAMILY HISTORY:  Remarkable for father and sister both with factor 5 Leiden mutation.  REVIEW OF SYSTEMS:  As stated in the history of present illness.  PHYSICAL EXAMINATION:  GENERAL:  This is a well-developed, well- nourished, white gentleman in no obvious distress. VITAL SIGNS:  Temperature of 97, pulse is 88, respiratory rate 16, blood pressure 109/59. HEAD AND NECK:  Shows a normocephalic and atraumatic skull.  There are no ocular or oral lesions.  There are no palpable cervical or supraclavicular lymph nodes. LUNGS:  Clear to percussion and auscultation bilaterally.  There are no rales, wheezes, or rhonchi. CARDIAC:  Regular rate and rhythm with normal S1-S2.  There were no murmurs, rubs, or bruits. ABDOMEN:  Soft and good bowel sounds.  There was no palpable abdominal mass.  There was no fluid wave.  There was no palpable hepatosplenomegaly. EXTREMITIES:  Shows bilateral leg edema.  He has about 1 to 2+ nonpitting edema of his lower legs bilaterally.  He has no joint swelling.  He has good range of motion of his joints.  He has good pulses in his distal extremities bilaterally. SKIN:  Exam shows no rashes, ecchymoses, or petechia. NEUROLOGIC:  Shows no focal neurological deficits.  IMPRESSION:  [name] is a 35 year old gentleman with factor 5 Leiden mutation.  He has extensive bilateral iliofemoral deep venous thromboses.  He has history of May-Thurner syndrome.  He clearly is going to need lifelong anticoagulation.  My concern, however, he is at significant risk for developing postphlebitic syndrome because of this extensive thrombus.  I really believe that he needs a therapeutic anticoagulation with a true anticoagulant for a prolonged period of time.  I think Coumadin just is not adequate in this current situation right now.  I really believe that if we guideline the Coumadin as his only form of anticoagulation, that he would develop a  postphlebitic syndrome.  I have noted that his platelet count has dropped.  This should be highly unusual for heparin-induced thrombocytopenia.  I think we are going to have to watch this closely, however.  I believe that as an outpatient, he would do well with Arixtra.  I think this would be manageable by him.  I believe this would be much more effective means of continuing to thin out his thrombi.  Then, this would help decrease his risk of postphlebitic syndrome.  Long-term, we could consider one of the new direct thrombin inhibitors (i.e. Pradaxa) or factor 10 inhibitors (  i.e. Xarelto).  These probably would be more active than Coumadin long-term.  Unfortunately, the FDA has not yet approved these for deep venous thrombosis, although trials have clearly shown their effectiveness in direct comparison to Coumadin in patients who have had deep venous thromboses.  Again, I think quality of life is going to be a critically issue here. Postphlebitic syndrome for [name] would really compromise his quality of life.  As such, I think we need to be aggressive with anticoagulation right now.  He will need bilateral thigh-high compression stockings.  These need to be fitted by Physical Therapy or Occupational Therapy to help minimize risk for postphlebitic syndrome.  We will certainly follow along.     Josph Macho, M.D.     PRE/MEDQ  D:  04/21/2011  T:  04/21/2011  Job:  [id number]  cc:   Hollice Espy, M.D. Fransisco Hertz, MD Jodi Marble. Fredia Sorrow, M.D.  Electronically Signed by Arlan Organ  on 05/21/2011 07:19:49 AM

## 2011-05-28 ENCOUNTER — Ambulatory Visit (HOSPITAL_BASED_OUTPATIENT_CLINIC_OR_DEPARTMENT_OTHER)
Admission: RE | Admit: 2011-05-28 | Discharge: 2011-05-28 | Disposition: A | Discharge status: Home / Self Care | Payer: Private Health Insurance - Indemnity | Source: Ambulatory Visit | Attending: Hematology & Oncology | Admitting: Hematology & Oncology

## 2011-05-28 DIAGNOSIS — I824Y9 Acute embolism and thrombosis of unspecified deep veins of unspecified proximal lower extremity: Secondary | ICD-10-CM | POA: Insufficient documentation

## 2011-06-01 ENCOUNTER — Encounter (HOSPITAL_BASED_OUTPATIENT_CLINIC_OR_DEPARTMENT_OTHER): Payer: Private Health Insurance - Indemnity | Admitting: Hematology & Oncology

## 2011-06-01 DIAGNOSIS — I824Y9 Acute embolism and thrombosis of unspecified deep veins of unspecified proximal lower extremity: Secondary | ICD-10-CM

## 2011-07-13 ENCOUNTER — Encounter (HOSPITAL_BASED_OUTPATIENT_CLINIC_OR_DEPARTMENT_OTHER): Payer: Private Health Insurance - Indemnity | Admitting: Hematology & Oncology

## 2011-07-13 DIAGNOSIS — Z7901 Long term (current) use of anticoagulants: Secondary | ICD-10-CM

## 2011-07-13 DIAGNOSIS — Z86718 Personal history of other venous thrombosis and embolism: Secondary | ICD-10-CM

## 2011-09-07 ENCOUNTER — Other Ambulatory Visit: Payer: Self-pay | Admitting: Hematology & Oncology

## 2011-09-07 ENCOUNTER — Other Ambulatory Visit (HOSPITAL_BASED_OUTPATIENT_CLINIC_OR_DEPARTMENT_OTHER): Payer: Private Health Insurance - Indemnity | Admitting: Lab

## 2011-09-07 ENCOUNTER — Ambulatory Visit (HOSPITAL_BASED_OUTPATIENT_CLINIC_OR_DEPARTMENT_OTHER): Payer: Private Health Insurance - Indemnity | Admitting: Hematology & Oncology

## 2011-09-07 ENCOUNTER — Encounter: Payer: Self-pay | Admitting: Hematology & Oncology

## 2011-09-07 VITALS — BP 127/82 | HR 57 | Temp 97.8°F | Ht 71.0 in | Wt 220.0 lb

## 2011-09-07 DIAGNOSIS — D6851 Activated protein C resistance: Secondary | ICD-10-CM | POA: Insufficient documentation

## 2011-09-07 DIAGNOSIS — D6859 Other primary thrombophilia: Secondary | ICD-10-CM

## 2011-09-07 DIAGNOSIS — I824Y9 Acute embolism and thrombosis of unspecified deep veins of unspecified proximal lower extremity: Secondary | ICD-10-CM | POA: Insufficient documentation

## 2011-09-07 DIAGNOSIS — I82429 Acute embolism and thrombosis of unspecified iliac vein: Secondary | ICD-10-CM

## 2011-09-07 DIAGNOSIS — I82409 Acute embolism and thrombosis of unspecified deep veins of unspecified lower extremity: Secondary | ICD-10-CM

## 2011-09-07 HISTORY — DX: Acute embolism and thrombosis of unspecified deep veins of unspecified proximal lower extremity: I82.4Y9

## 2011-09-07 HISTORY — DX: Activated protein C resistance: D68.51

## 2011-09-07 LAB — D-DIMER, QUANTITATIVE: D-Dimer, Quant: 0.23 ug/mL-FEU (ref 0.00–0.48)

## 2011-09-07 LAB — CBC WITH DIFFERENTIAL (CANCER CENTER ONLY)
BASO%: 0.3 % (ref 0.0–2.0)
LYMPH%: 40.9 % (ref 14.0–48.0)
MCV: 83 fL (ref 82–98)
MONO#: 0.6 10*3/uL (ref 0.1–0.9)
NEUT#: 2.8 10*3/uL (ref 1.5–6.5)
Platelets: 220 10*3/uL (ref 145–400)
RDW: 13.2 % (ref 11.1–15.7)
WBC: 6 10*3/uL (ref 4.0–10.0)

## 2011-09-07 NOTE — Progress Notes (Signed)
This office note has been dictated.

## 2011-09-07 NOTE — Progress Notes (Signed)
DIAGNOSES: 1. Bilateral iliofemoral deep venous thromboses . 2. Heterozygous factor V Leiden mutation.  CURRENT THERAPY:  Xarelto 20 mg p.o. daily.  INTERIM HISTORY:  [name] comes in for followup.  He had a good Thanksgiving.  He had to go up to [place] state.  He did stop every couple of hours just to walk around.  He is wearing the compression stockings on his legs.  He is working without difficulties.  He is going to go to [place] in February for business.  I think this would be okay for him.  He is doing well on the Xarelto.  There has been no bleeding.  He has had no problems with abdominal pain.  There is no cough or shortness breath.  He denies any kind of chest pain.  He says that the numbness in his right foot has resolved.  On his last Doppler test, which was, I think, back in October, there was no DVT in his legs.  PHYSICAL EXAMINATION:  General Appearance:  This is a well-developed, well-nourished, white gentleman in no obvious distress.  Vital Signs: 97.8, pulse 57, respiratory rate 20, blood pressure 127/82.  Weight is 220.  Head and Neck Exam:  A normocephalic, atraumatic skull.  There are no ocular or oral lesions.  There are no palpable cervical or supraclavicular lymph nodes.  Lungs:  Clear to percussion and auscultation bilaterally.  Cardiac Exam:  Regular rate and rhythm with a normal S1 and S2.  There are no murmurs, rubs, or bruits.  Abdominal Exam:  Soft with good bowel sounds.  There is no palpable abdominal mass.  There is no fluid wave.  There is no palpable hepatosplenomegaly. Extremities:  No clubbing, cyanosis, or edema.  He has good range of motion of his joints.  No venous cord is noted in his lower legs.  He has a negative Homan's sign.  Back Exam:  No tenderness over the spine, ribs, or hips.  Skin Exam:  No rashes, ecchymosis, or petechia.  LABORATORY STUDIES:  White cell count 6, hemoglobin 16 hematocrit 45, platelet count 220.  MCV is  83.  IMPRESSION:  [name] is a nice, 35 year old gentleman with a history of recurrent deep venous thrombosis.  This was a serous deep venous thrombosis.  He is heterozygous for the factor V Leiden mutation. Of note, his father recently had a thrombotic event.  Apparently, he is doing well on the Xarelto.  I really think Xarelto is a great choice for him given his lifestyle and work.  Again, I do not see any problem with him going to [place].  I think that he needs to wear his compression stockings on his legs during his trip. He does need to get up every couple of hours to walk around.  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  I want to see Mr. Rosalio Macadamia in about 3 months' time now.  I do not think we need any blood work or x-ray studies in between visits.    ______________________________ Josph Macho, M.D. PRE/MEDQ  D:  09/07/2011  T:  09/07/2011  Job:  [id number]  ADDENDUM:  D-Dimer is 0.23.

## 2011-09-17 LAB — CHROMOGENIC FACTOR X: CHROM XA: 142.4 % (ref 86–146)

## 2011-12-10 ENCOUNTER — Other Ambulatory Visit (HOSPITAL_BASED_OUTPATIENT_CLINIC_OR_DEPARTMENT_OTHER): Payer: Private Health Insurance - Indemnity | Admitting: Lab

## 2011-12-10 ENCOUNTER — Ambulatory Visit (HOSPITAL_BASED_OUTPATIENT_CLINIC_OR_DEPARTMENT_OTHER): Payer: Private Health Insurance - Indemnity | Admitting: Hematology & Oncology

## 2011-12-10 VITALS — BP 125/82 | HR 59 | Temp 98.3°F | Ht 71.0 in | Wt 223.0 lb

## 2011-12-10 DIAGNOSIS — I82429 Acute embolism and thrombosis of unspecified iliac vein: Secondary | ICD-10-CM

## 2011-12-10 DIAGNOSIS — D6859 Other primary thrombophilia: Secondary | ICD-10-CM

## 2011-12-10 DIAGNOSIS — I824Y9 Acute embolism and thrombosis of unspecified deep veins of unspecified proximal lower extremity: Secondary | ICD-10-CM

## 2011-12-10 DIAGNOSIS — D6851 Activated protein C resistance: Secondary | ICD-10-CM

## 2011-12-10 LAB — CBC WITH DIFFERENTIAL (CANCER CENTER ONLY)
EOS%: 2.3 % (ref 0.0–7.0)
Eosinophils Absolute: 0.1 10*3/uL (ref 0.0–0.5)
MCH: 30.5 pg (ref 28.0–33.4)
MCHC: 35.5 g/dL (ref 32.0–35.9)
MONO%: 10.2 % (ref 0.0–13.0)
NEUT#: 2.8 10*3/uL (ref 1.5–6.5)
Platelets: 225 10*3/uL (ref 145–400)
RBC: 5.22 10*6/uL (ref 4.20–5.70)

## 2011-12-10 LAB — D-DIMER, QUANTITATIVE: D-Dimer, Quant: 0.22 ug/mL-FEU (ref 0.00–0.48)

## 2011-12-10 NOTE — Progress Notes (Signed)
DIAGNOSES: 1. Bilateral iliofemoral deep venous thrombosis. 2. Factor V Leiden mutation-heterozygous.  CURRENT THERAPY: 1. Xarelto 20 mg p.o. daily.  INTERIM HISTORY:  [name] comes in for his followup.  He really looks great.  He is working for [employer] He is traveling.  He is really having a great time with them.  He is really back to his prior status.  He is not having swelling in his legs.  He is exercising.  He is eating what he wants.  The Xarelto clearly has been a huge benefit for him.  When we last saw him, we checked his D-dimer.  This was 0.23 back in December.  He has had no bleeding.  He has had no change in bowel or bladder habits.  There has been no cough.  There has been no chest pain.  PHYSICAL EXAM:  General: This is a well-developed, well-nourished, white gentleman in no obvious distress.  Vital signs: Show a temperature of 98.3, pulse 59, respiratory 18, blood pressure 125/82, and weight is 223.  Head and neck exam shows a normocephalic, atraumatic skull.  There are no ocular are no ocular or oral lesions.  There are no palpable cervical or supraclavicular lymph nodes.  Lungs are clear bilaterally. Cardiac examination:  Regular rate and rhythm with a normal S1, S2. There are no murmurs, rubs, or bruits.  Abdominal exam: Soft with good bowel sounds.  There is no palpable abdominal mass.  There is no fluid wave.  There is no palpable hepatosplenomegaly.  Extremities: Shows no clubbing, cyanosis, or edema.  There is no palpable venous cord in his legs.  He has good range of motion of his joints.  He has good pulses in his distal extremities.  Skin exam: No rashes, ecchymosis, or petechia.  LABORATORY STUDIES:  White cell count 6.2, hemoglobin 16, hematocrit 45, and platelet count 225.  IMPRESSION:  [name] is a 36 year old gentleman with a heterozygous Factor V Leiden mutation.  He has done well.  He is on Xarelto.  He will be on Xarelto for  long-term purposes.  He does do a lot of traveling.  I told him to make sure that he does get up and walk around when he does travel. I also told him to wear  his compression stockings when he does travel, particularly when he goes by car.  I think we get him back in 3 more months for followup.    ______________________________ Josph Macho, M.D. PRE/MEDQ  D:  12/10/2011  T:  12/10/2011  Job:  [id number]

## 2011-12-10 NOTE — Progress Notes (Signed)
This office note has been dictated.

## 2011-12-13 ENCOUNTER — Telehealth: Payer: Self-pay | Admitting: *Deleted

## 2011-12-13 NOTE — Telephone Encounter (Signed)
Message copied by Anselm Jungling on Thu Dec 13, 2011 12:28 PM ------      Message from: Arlan Organ R      Created: Mon Dec 10, 2011  6:39 PM       Call- labs look great!! The clotting study that I use shows that his blood is nice and thin!!!!  Cindee Lame

## 2011-12-13 NOTE — Telephone Encounter (Signed)
Called patient and left message to tell him that his labwork looked great.  The clotting study Dr. Lupita Leash uses shows his blood is nice and thin per dr. Myna Hidalgo.  Called this message to his personal voice mail.

## 2012-02-07 IMAGING — US US EXTREM LOW VENOUS BILAT
1 series · 13 of 24 positions shown · non-contrast
Comparison: Bilateral lower extremity venogram/intervention -
04/21/2011

CLINICAL DATA: Evaluate bilateral iliofemoral DVT seen on prior
venogram; Post left iliac vein stenting and IVC filter placement,
Factor 5 Vollmer, currently taking Arixtra

BILATERAL LOWER EXTREMITY VENOUS DUPLEX ULTRASOUND
TECHNIQUE: Gray-scale sonography with graded compression, as well
as color Doppler and duplex ultrasound, were performed to evaluate
the deep venous system of both lower extremities from the level of
the common femoral vein through the popliteal and proximal calf
veins.  Spectral Doppler was utilized to evaluate flow at rest and
with distal augmentation maneuvers.

[Series 1: us extrem low venous bilat · 13 of 47 slices shown]
[im 1/47]
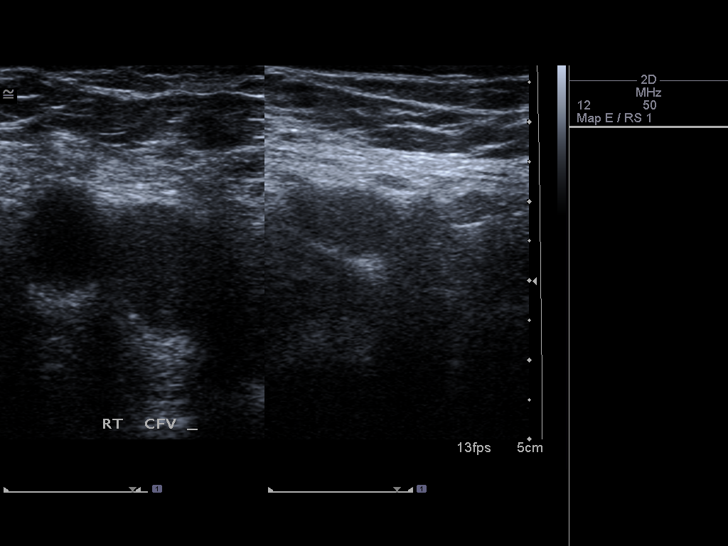
[im 5/47]
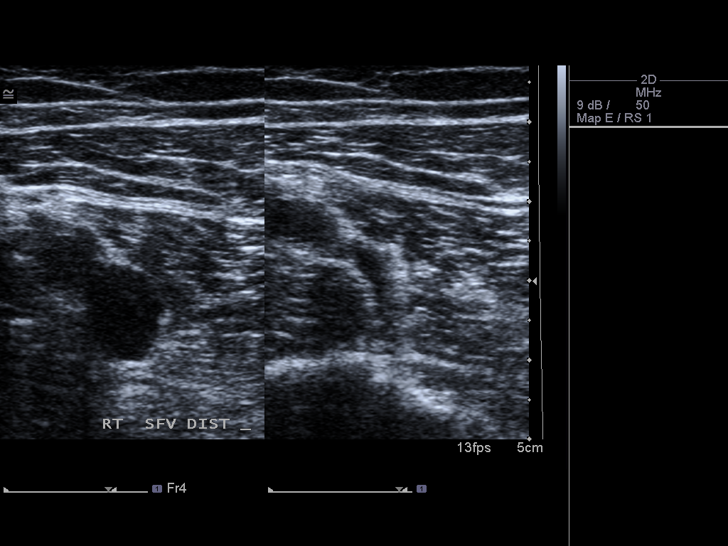
[im 9/47]
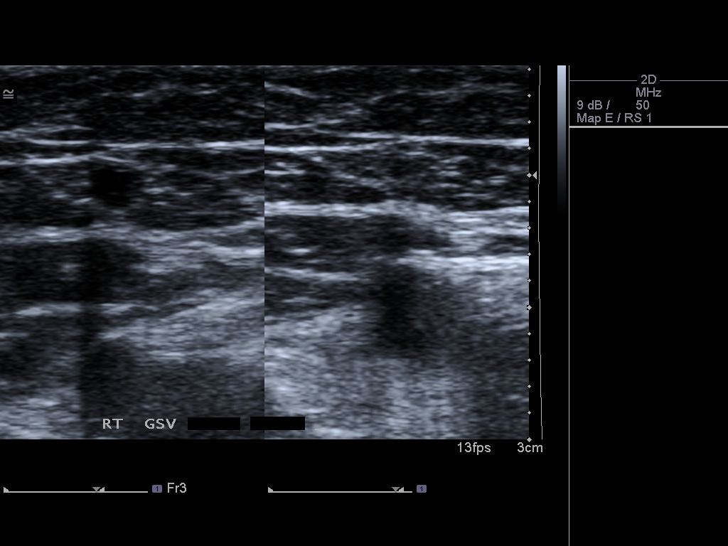
[im 13/47]
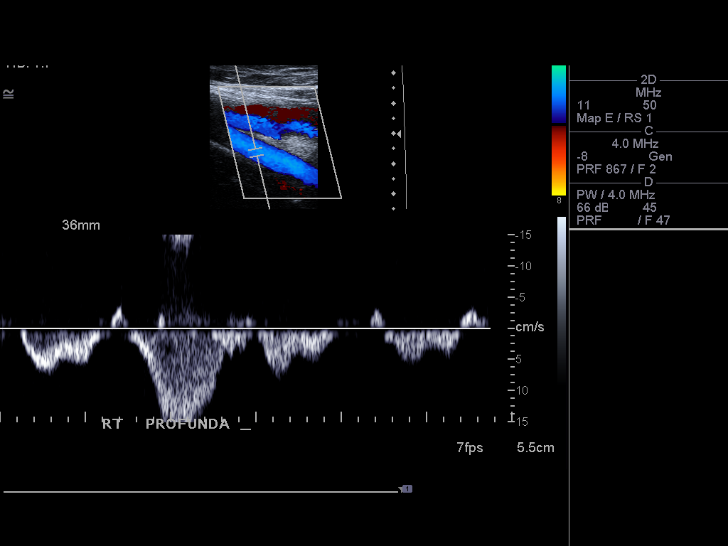
[im 17/47]
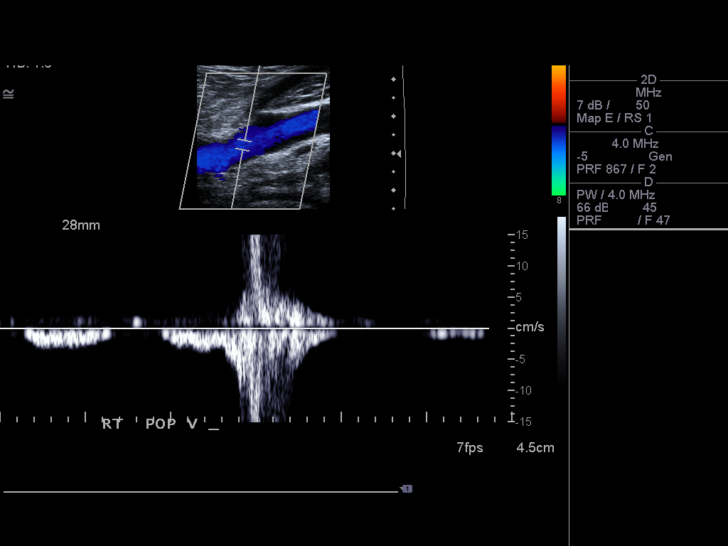
[im 21/47]
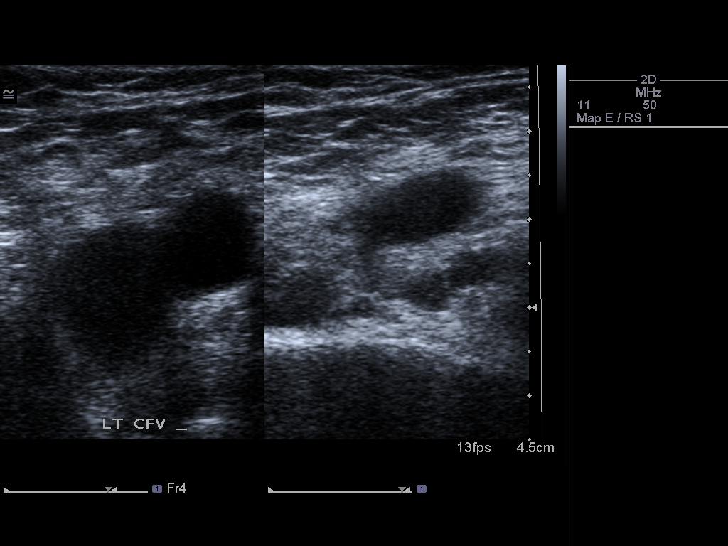
[im 25/47]
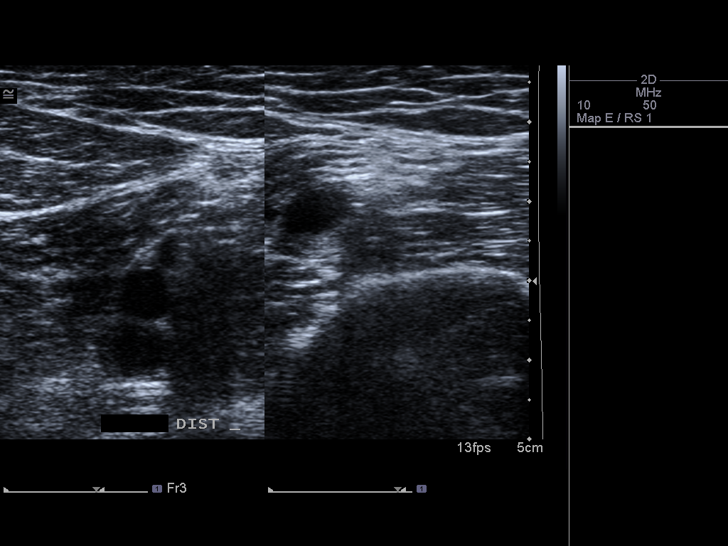
[im 27/47]
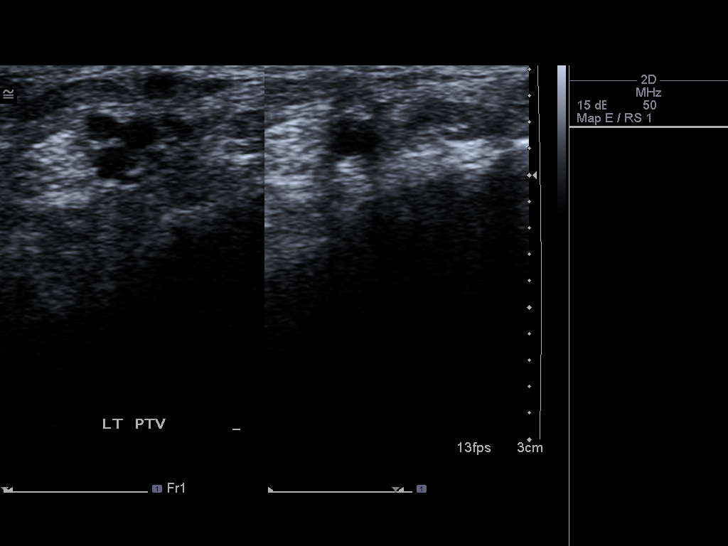
[im 31/47]
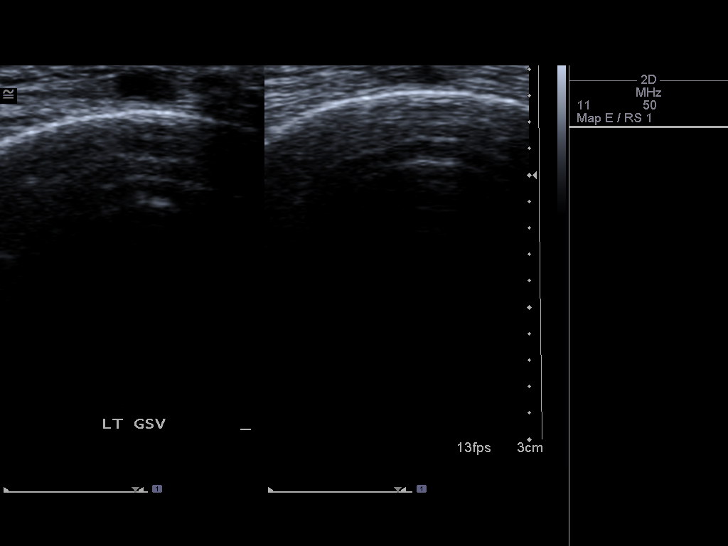
[im 35/47]
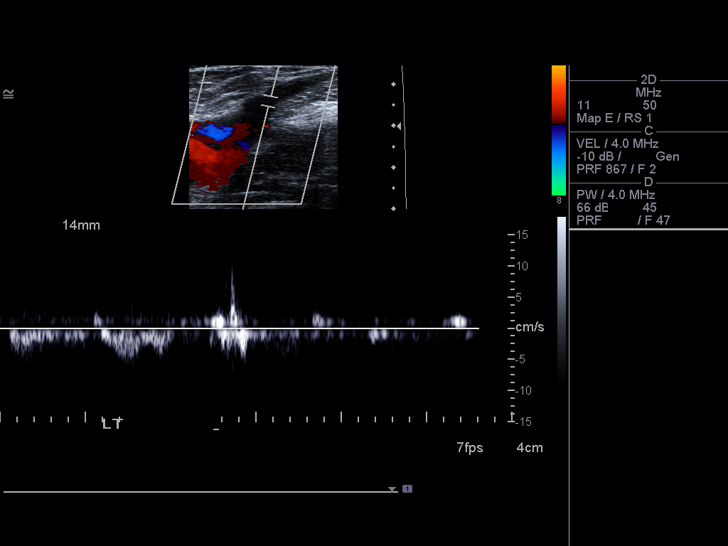
[im 39/47]
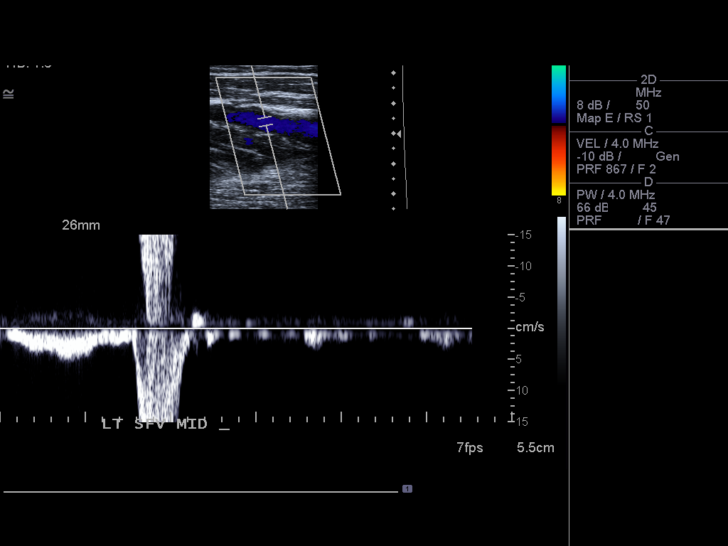
[im 43/47]
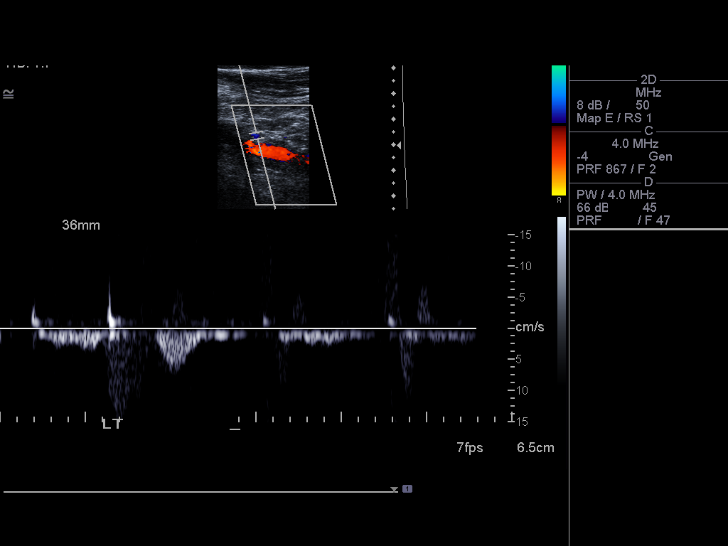
[im 47/47]
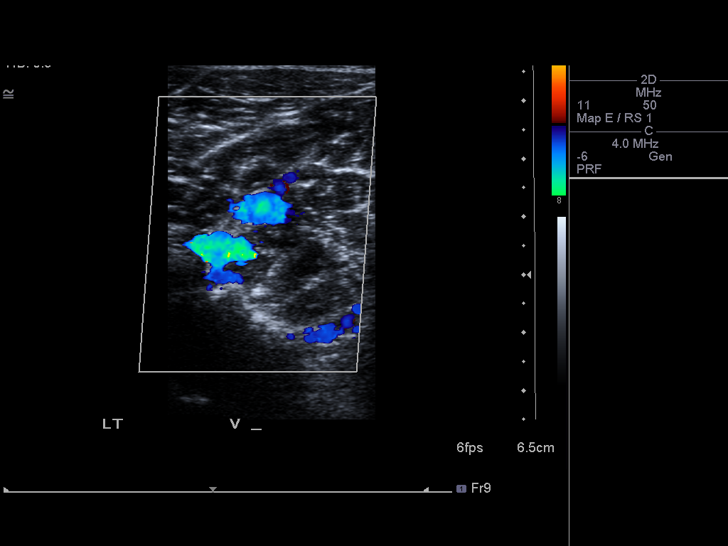

[13 of 24 positions shown; findings below may reference images not displayed]

FINDINGS: Normal compressibility of bilateral common femoral,
superficial femoral, and popliteal veins is demonstrated, as well
as the visualized proximal calf veins.  No filling defects to
suggest DVT on grayscale or color Doppler imaging.  There is no
definite areas of wall thickening to suggest chronic DVT.  Doppler
waveforms show normal direction of venous flow, normal respiratory
phasicity and response to augmentation.
IMPRESSION: No evidence of acute or definite evidence of chronic deep vein
thrombosis within either lower extremity.

## 2012-03-19 ENCOUNTER — Telehealth: Payer: Self-pay | Admitting: Hematology & Oncology

## 2012-03-19 ENCOUNTER — Other Ambulatory Visit (HOSPITAL_BASED_OUTPATIENT_CLINIC_OR_DEPARTMENT_OTHER): Payer: Private Health Insurance - Indemnity | Admitting: Lab

## 2012-03-19 ENCOUNTER — Ambulatory Visit (HOSPITAL_BASED_OUTPATIENT_CLINIC_OR_DEPARTMENT_OTHER): Payer: Private Health Insurance - Indemnity | Admitting: Hematology & Oncology

## 2012-03-19 VITALS — BP 127/79 | HR 54 | Temp 97.2°F | Ht 72.0 in | Wt 224.0 lb

## 2012-03-19 DIAGNOSIS — I82409 Acute embolism and thrombosis of unspecified deep veins of unspecified lower extremity: Secondary | ICD-10-CM

## 2012-03-19 DIAGNOSIS — I824Y9 Acute embolism and thrombosis of unspecified deep veins of unspecified proximal lower extremity: Secondary | ICD-10-CM

## 2012-03-19 LAB — CBC WITH DIFFERENTIAL (CANCER CENTER ONLY)
BASO%: 0.7 % (ref 0.0–2.0)
EOS%: 2.4 % (ref 0.0–7.0)
LYMPH#: 2.2 10*3/uL (ref 0.9–3.3)
MCHC: 35.8 g/dL (ref 32.0–35.9)
NEUT#: 2.9 10*3/uL (ref 1.5–6.5)
Platelets: 219 10*3/uL (ref 145–400)
RDW: 12.8 % (ref 11.1–15.7)

## 2012-03-19 LAB — D-DIMER, QUANTITATIVE: D-Dimer, Quant: 0.32 ug/mL-FEU (ref 0.00–0.48)

## 2012-03-19 NOTE — Telephone Encounter (Signed)
Mailed September schedule °

## 2012-03-19 NOTE — Progress Notes (Signed)
This office note has been dictated.

## 2012-03-20 NOTE — Progress Notes (Signed)
CC:   Randy Cannon. Randy Cannon, M.D.  DIAGNOSES: 1. Bilateral iliofemoral deep vein thrombosis (DVT). 2. Factor V Leiden mutation, heterozygous.  CURRENT THERAPY:  Xarelto 20 mg p.o. daily.  INTERIM HISTORY:  Randy Cannon comes in for followup.  He is still doing well.  He is only complaining of occasional discomfort in his, I think, right leg.  This is a more so when he walks a while.  He says there is a little bit of numbness in the lateral foot.  He says his goes away once he slows down and rests. He just bought a house.  He and his girlfriend will be moving in soon.  He has had no problems with bleeding or bruising.  He has had no abdominal pain.  He has had no cough.  There has been no shortness of breath.  He has had no change in bowel or bladder habits.  His last D-dimer was 0.22 back in March.  PHYSICAL EXAMINATION:  This is a well-developed, well-nourished white gentleman in no obvious distress.  Vital Signs: 97.2, pulse 54, respiratory rate 20, blood pressure 127/79.  Weight is 224.  Head and neck:  Normocephalic, atraumatic skull.  There are no ocular or oral lesions.  There are no palpable cervical or supraclavicular lymph nodes. Lungs:  Clear bilaterally.  Cardiac:  Regular rate and rhythm with a normal S1 and S2.  There are no murmurs, rubs or bruits.  Abdomen:  Soft with good bowel sounds.  There is no palpable abdominal mass.  There is no fluid wave.  There is no palpable hepatosplenomegaly.  Back; There is no tenderness over the spine, ribs, or hips.  Extremities:  No clubbing, cyanosis or edema.  No palpable venous cords noted in his legs.  He has good pulses in his distal extremities.  Neurological:  No focal neurological deficits.  LABORATORY STUDIES:  White cell count is 6, hemoglobin 15.7, hematocrit 43.9, platelet count 219.  IMPRESSION:  Randy Cannon is a 36 year old gentleman with a bilateral iliofemoral deep venous thrombosis.  This was a 2nd episode for  him.  He does have the factor V Leiden mutation.  Of note, his father also has history of DVT.  His father also has a factor V Leiden mutation.  Randy Cannon is doing well on the Xarelto. We will continue him on Xarelto.  He will be on Xarelto long-term.  I will see about getting Randy Cannon to see Randy Cannon for general doctor purposes.  Randy Cannon needs to have a general internist.  I think Dr. Eloise Cannon would be a good fit for him.  I do not see that we need to do any Dopplers on Randy Cannon right now.  I will plan to get him back to see me in another 3 months.    ______________________________ Josph Cannon, M.D. PRE/MEDQ  D:  03/19/2012  T:  03/20/2012  Job:  [id number]

## 2012-06-19 ENCOUNTER — Ambulatory Visit: Payer: Private Health Insurance - Indemnity | Admitting: Hematology & Oncology

## 2012-06-19 ENCOUNTER — Other Ambulatory Visit: Payer: Private Health Insurance - Indemnity | Admitting: Lab

## 2012-06-23 ENCOUNTER — Telehealth: Payer: Self-pay | Admitting: Hematology & Oncology

## 2012-06-23 NOTE — Telephone Encounter (Signed)
Pt made 10-2 from missed 9-19

## 2012-07-02 ENCOUNTER — Other Ambulatory Visit (HOSPITAL_BASED_OUTPATIENT_CLINIC_OR_DEPARTMENT_OTHER): Payer: Private Health Insurance - Indemnity | Admitting: Lab

## 2012-07-02 ENCOUNTER — Ambulatory Visit (HOSPITAL_BASED_OUTPATIENT_CLINIC_OR_DEPARTMENT_OTHER): Payer: Private Health Insurance - Indemnity | Admitting: Hematology & Oncology

## 2012-07-02 VITALS — BP 126/80 | HR 71 | Temp 98.0°F | Resp 18 | Ht 70.0 in | Wt 222.0 lb

## 2012-07-02 DIAGNOSIS — I82409 Acute embolism and thrombosis of unspecified deep veins of unspecified lower extremity: Secondary | ICD-10-CM

## 2012-07-02 DIAGNOSIS — I824Y9 Acute embolism and thrombosis of unspecified deep veins of unspecified proximal lower extremity: Secondary | ICD-10-CM

## 2012-07-02 DIAGNOSIS — Z7901 Long term (current) use of anticoagulants: Secondary | ICD-10-CM

## 2012-07-02 DIAGNOSIS — D6859 Other primary thrombophilia: Secondary | ICD-10-CM

## 2012-07-02 LAB — CBC WITH DIFFERENTIAL (CANCER CENTER ONLY)
BASO#: 0 10*3/uL (ref 0.0–0.2)
EOS%: 2.7 % (ref 0.0–7.0)
Eosinophils Absolute: 0.2 10*3/uL (ref 0.0–0.5)
HGB: 15.4 g/dL (ref 13.0–17.1)
LYMPH#: 2.1 10*3/uL (ref 0.9–3.3)
MCHC: 35.6 g/dL (ref 32.0–35.9)
NEUT#: 3.4 10*3/uL (ref 1.5–6.5)
RBC: 5.04 10*6/uL (ref 4.20–5.70)

## 2012-07-02 NOTE — Progress Notes (Signed)
This office note has been dictated.

## 2012-07-03 NOTE — Progress Notes (Signed)
CC:   Randy Cannon. [name], M.D.  DIAGNOSES: 1. Bilateral iliofemoral deep vein thrombosis.  2.  Heterozygous     factor V Leiden mutation.  CURRENT THERAPY:  Xarelto 20 mg p.o. daily, lifelong.  INTERIM HISTORY:  [name] came in for his followup.  He __________ last week which is why he missed his appointment.  He is doing quite well.  He says he really does not have the numbness in his left foot.  He does have a little bit of pain in his right knee.  He says he hurt the knee while he was down [place] the beach [place] some [place].  He does not think that he needs to see an orthopedic surgeon.  He is working without any difficulties.  He has not had any problems with leg pain.  He has had no bleeding.  He has had no cough or shortness breath.  He has had no headache.  PHYSICAL EXAMINATION:  General:  This is a well-developed, well- nourished white gentleman in no obvious distress.  Vital Signs:  98, pulse, respiratory rate 18, blood pressure 126/80.  Weight is 222.  Head and Neck:  Normocephalic, atraumatic skull.  There are no ocular or oral lesions.  There are no palpable cervical or supraclavicular lymph nodes. Lungs:  Clear bilaterally.  Cardiac:  Regular rate and rhythm with a normal S1, S2.  There are no murmurs, rubs, or bruits.  Abdomen:  Soft with good bowel sounds.  There is no palpable abdominal mass.  There is no fluid wave.  There is no palpable hepatosplenomegaly.  Back:  No tenderness over the spine, ribs, or hips.  Extremities:  No clubbing, cyanosis, or edema.  He has no palpable venous cord in his legs.  He has good pulses in his distal extremities.  There may be a little bit of tenderness with respect to his right knee.  There has been no swelling that I could tell with his right knee.  Skin:  No rashes, ecchymosis, or petechia.  LABORATORY STUDIES:  White cell count 6.4, hemoglobin 13.4, hematocrit 43.2, platelet count 235.  IMPRESSION:  [name] is a  36 year old gentleman with factor V Leiden mutation.  Again, he is heterozygous for this.  He has had bilateral iliofemoral deep vein thromboses.  He is on lifelong anticoagulation.  He has done well with this.  He still has not seen a general internist.  I again referred to Dr. Dossie Arbour.  I gave Dr. Silvano Rusk phone number.  I still do not see any indication for a Doppler of his legs as he is asymptomatic and his last Dopplers were normal.  I will plan to get him back to see [place] after the holidays now.  I think we can get him through the holidays.    ______________________________ Josph Macho, M.D. PRE/MEDQ  D:  07/02/2012  T:  07/03/2012  Job:  [id number]

## 2012-07-25 ENCOUNTER — Other Ambulatory Visit: Payer: Self-pay | Admitting: *Deleted

## 2012-07-25 DIAGNOSIS — D6851 Activated protein C resistance: Secondary | ICD-10-CM

## 2012-07-25 DIAGNOSIS — I824Y9 Acute embolism and thrombosis of unspecified deep veins of unspecified proximal lower extremity: Secondary | ICD-10-CM

## 2012-07-25 DIAGNOSIS — I82429 Acute embolism and thrombosis of unspecified iliac vein: Secondary | ICD-10-CM

## 2012-07-25 MED ORDER — RIVAROXABAN 10 MG PO TABS: 10.0000 mg | ORAL_TABLET | Freq: Every day | ORAL | Status: DC

## 2012-07-25 NOTE — Telephone Encounter (Signed)
Received refill request from [facility] for Xarelto 20 mg. This is a chronic, lifelong medication for the pt per Dr Myna Hidalgo. Refilled via e-prescribe.

## 2012-11-06 ENCOUNTER — Other Ambulatory Visit: Payer: Private Health Insurance - Indemnity | Admitting: Lab

## 2012-11-06 ENCOUNTER — Ambulatory Visit: Payer: Private Health Insurance - Indemnity | Admitting: Hematology & Oncology

## 2012-11-07 ENCOUNTER — Telehealth: Payer: Self-pay | Admitting: Hematology & Oncology

## 2012-11-07 NOTE — Telephone Encounter (Signed)
Pt will call back to reschedule missed 2-6 appointment. He called answering service left message on Tuesday we did not get message

## 2013-05-06 ENCOUNTER — Telehealth: Payer: Self-pay | Admitting: Hematology & Oncology

## 2013-05-06 NOTE — Telephone Encounter (Signed)
Pt made 8-27 appointment from 2-6 no show

## 2013-05-27 ENCOUNTER — Ambulatory Visit (HOSPITAL_BASED_OUTPATIENT_CLINIC_OR_DEPARTMENT_OTHER): Payer: Private Health Insurance - Indemnity | Admitting: Lab

## 2013-05-27 ENCOUNTER — Ambulatory Visit (HOSPITAL_BASED_OUTPATIENT_CLINIC_OR_DEPARTMENT_OTHER): Payer: Private Health Insurance - Indemnity | Admitting: Hematology & Oncology

## 2013-05-27 DIAGNOSIS — D6851 Activated protein C resistance: Secondary | ICD-10-CM

## 2013-05-27 DIAGNOSIS — Z7901 Long term (current) use of anticoagulants: Secondary | ICD-10-CM

## 2013-05-27 DIAGNOSIS — D6859 Other primary thrombophilia: Secondary | ICD-10-CM

## 2013-05-27 DIAGNOSIS — I824Y9 Acute embolism and thrombosis of unspecified deep veins of unspecified proximal lower extremity: Secondary | ICD-10-CM

## 2013-05-27 LAB — CBC WITH DIFFERENTIAL (CANCER CENTER ONLY)
BASO%: 0.4 % (ref 0.0–2.0)
HCT: 43.2 % (ref 38.7–49.9)
LYMPH#: 2.5 10*3/uL (ref 0.9–3.3)
MONO#: 0.6 10*3/uL (ref 0.1–0.9)
NEUT#: 3.9 10*3/uL (ref 1.5–6.5)
NEUT%: 54.3 % (ref 40.0–80.0)
RDW: 12.3 % (ref 11.1–15.7)
WBC: 7.3 10*3/uL (ref 4.0–10.0)

## 2013-05-27 MED ORDER — RIVAROXABAN 20 MG PO TABS: 20.0000 mg | ORAL_TABLET | Freq: Every day | ORAL | Status: DC

## 2013-05-27 NOTE — Progress Notes (Signed)
This office note has been dictated.

## 2013-05-28 LAB — D-DIMER, QUANTITATIVE: D-Dimer, Quant: 0.27 ug/mL-FEU (ref 0.00–0.48)

## 2013-05-28 NOTE — Progress Notes (Signed)
CC:   Randy Cannon. Randy Cannon, M.D.  DIAGNOSES: 1. Bilateral iliofemoral deep vein thromboses. 2. Factor V Leiden mutation, heterozygous.  CURRENT THERAPY:  Xarelto 20 mg p.o. daily, lifelong.  INTERIM HISTORY:  [name] comes in for his followup.  He is doing well.  He has had no problems with the Xarelto.  He has had no problems with leg swelling.  There has been no bleeding or bruising.  He has been working out.  He is watching what he eats.  He has not yet been able to see Dr. Eloise Cannon.  I will call the Dr. Eloise Cannon myself to try to get him in to be seen.  He has not noted any problems with leg swelling.  He has had no leg pain.  There are no neuropathic issues.  PHYSICAL EXAMINATION:  General:  This is a well-developed, well- nourished white gentleman in no obvious distress.  Vital signs: Temperature of 98.2, pulse 48, respiratory rate 18, blood pressure 120/72.  Weight is 223.  Head and neck:  Normocephalic, atraumatic skull.  There are no ocular or oral lesions.  There are no palpable cervical or supraclavicular lymph nodes.  Lungs:  Clear bilaterally. Cardiac:  Regular rate and rhythm with a normal S1 and S2.  There are no murmurs, rubs or bruits.  Abdomen:  Soft.  He has good bowel sounds. There is no fluid wave.  There is no palpable hepatosplenomegaly.  Back: No tenderness over the spine, ribs, or hips.  Extremities:  No clubbing, cyanosis or edema.  No palpable venous cord is noted in the legs.  He has a negative Homans sign bilaterally.  LABORATORY STUDIES:  White cell count is 7.3, hemoglobin 15.4, hematocrit 43.2, platelet count 204,000.  IMPRESSION:  [name] is a really nice 37 year old gentleman with factor V Leiden mutation.  He is heterozygous for this.  He has bilateral iliofemoral deep vein thromboses.  He presented back in September 2012.  Again, he is on lifelong anticoagulation because of the severity of the thrombotic event.  At this point in  time, I do not think we need to see [name] back unless he has a problem.  He is doing well.  He is asymptomatic.  Again, I will be more than happy to see him if there are any issues.    ______________________________ Randy Cannon, M.D. PRE/MEDQ  D:  05/27/2013  T:  05/28/2013  Job:  [id number]

## 2014-06-22 ENCOUNTER — Other Ambulatory Visit: Payer: Self-pay | Admitting: Hematology & Oncology

## 2014-09-05 ENCOUNTER — Other Ambulatory Visit: Payer: Self-pay | Admitting: Hematology & Oncology

## 2014-10-15 ENCOUNTER — Other Ambulatory Visit: Payer: Self-pay | Admitting: Hematology & Oncology

## 2014-12-04 ENCOUNTER — Other Ambulatory Visit: Payer: Self-pay | Admitting: Hematology & Oncology

## 2015-02-11 ENCOUNTER — Other Ambulatory Visit: Payer: Self-pay | Admitting: Hematology & Oncology

## 2015-03-25 ENCOUNTER — Other Ambulatory Visit: Payer: Self-pay | Admitting: Hematology & Oncology

## 2015-05-07 ENCOUNTER — Other Ambulatory Visit: Payer: Self-pay | Admitting: Hematology & Oncology

## 2015-06-10 ENCOUNTER — Other Ambulatory Visit: Payer: Self-pay | Admitting: Hematology & Oncology

## 2015-07-23 ENCOUNTER — Other Ambulatory Visit: Payer: Self-pay | Admitting: Hematology & Oncology

## 2015-08-24 ENCOUNTER — Other Ambulatory Visit: Payer: Self-pay | Admitting: Hematology & Oncology

## 2015-09-30 ENCOUNTER — Other Ambulatory Visit: Payer: Self-pay | Admitting: Hematology & Oncology

## 2015-11-14 ENCOUNTER — Other Ambulatory Visit: Payer: Self-pay | Admitting: Hematology & Oncology

## 2015-12-22 ENCOUNTER — Other Ambulatory Visit: Payer: Self-pay | Admitting: *Deleted

## 2015-12-22 DIAGNOSIS — I824Y9 Acute embolism and thrombosis of unspecified deep veins of unspecified proximal lower extremity: Secondary | ICD-10-CM

## 2015-12-22 DIAGNOSIS — D6851 Activated protein C resistance: Secondary | ICD-10-CM

## 2015-12-22 MED ORDER — RIVAROXABAN 20 MG PO TABS: 20.0000 mg | ORAL_TABLET | Freq: Every day | ORAL | Status: DC

## 2016-12-31 ENCOUNTER — Other Ambulatory Visit: Payer: Self-pay | Admitting: Hematology & Oncology

## 2016-12-31 DIAGNOSIS — D6851 Activated protein C resistance: Secondary | ICD-10-CM

## 2016-12-31 DIAGNOSIS — I824Y9 Acute embolism and thrombosis of unspecified deep veins of unspecified proximal lower extremity: Secondary | ICD-10-CM

## 2017-01-13 DIAGNOSIS — R05 Cough: Secondary | ICD-10-CM | POA: Diagnosis not present

## 2017-01-13 DIAGNOSIS — J309 Allergic rhinitis, unspecified: Secondary | ICD-10-CM | POA: Diagnosis not present

## 2017-06-14 DIAGNOSIS — J209 Acute bronchitis, unspecified: Secondary | ICD-10-CM | POA: Diagnosis not present

## 2017-07-15 DIAGNOSIS — E039 Hypothyroidism, unspecified: Secondary | ICD-10-CM | POA: Diagnosis not present

## 2017-07-15 DIAGNOSIS — E669 Obesity, unspecified: Secondary | ICD-10-CM | POA: Diagnosis not present

## 2017-07-15 DIAGNOSIS — D6851 Activated protein C resistance: Secondary | ICD-10-CM | POA: Diagnosis not present

## 2017-07-15 DIAGNOSIS — E7800 Pure hypercholesterolemia, unspecified: Secondary | ICD-10-CM | POA: Diagnosis not present

## 2017-07-15 DIAGNOSIS — Z0000 Encounter for general adult medical examination without abnormal findings: Secondary | ICD-10-CM | POA: Diagnosis not present

## 2018-07-30 DIAGNOSIS — R05 Cough: Secondary | ICD-10-CM | POA: Diagnosis not present

## 2018-07-30 DIAGNOSIS — J069 Acute upper respiratory infection, unspecified: Secondary | ICD-10-CM | POA: Diagnosis not present

## 2018-12-17 DIAGNOSIS — E291 Testicular hypofunction: Secondary | ICD-10-CM | POA: Diagnosis not present

## 2018-12-17 DIAGNOSIS — Z0000 Encounter for general adult medical examination without abnormal findings: Secondary | ICD-10-CM | POA: Diagnosis not present

## 2018-12-17 DIAGNOSIS — E7800 Pure hypercholesterolemia, unspecified: Secondary | ICD-10-CM | POA: Diagnosis not present

## 2018-12-17 DIAGNOSIS — E039 Hypothyroidism, unspecified: Secondary | ICD-10-CM | POA: Diagnosis not present

## 2018-12-18 DIAGNOSIS — E039 Hypothyroidism, unspecified: Secondary | ICD-10-CM | POA: Diagnosis not present

## 2018-12-18 DIAGNOSIS — E7800 Pure hypercholesterolemia, unspecified: Secondary | ICD-10-CM | POA: Diagnosis not present

## 2019-03-24 DIAGNOSIS — E039 Hypothyroidism, unspecified: Secondary | ICD-10-CM | POA: Diagnosis not present

## 2019-03-25 ENCOUNTER — Encounter (HOSPITAL_COMMUNITY): Payer: Self-pay | Admitting: Emergency Medicine

## 2019-03-25 ENCOUNTER — Emergency Department (HOSPITAL_COMMUNITY): Payer: BC Managed Care – PPO

## 2019-03-25 ENCOUNTER — Other Ambulatory Visit: Payer: Self-pay

## 2019-03-25 ENCOUNTER — Inpatient Hospital Stay (HOSPITAL_COMMUNITY)
Admission: EM | Admit: 2019-03-25 | Discharge: 2019-03-28 | DRG: 247 | Disposition: A | Discharge status: Home / Self Care | Payer: BC Managed Care – PPO | Attending: Cardiovascular Disease | Admitting: Cardiovascular Disease

## 2019-03-25 ENCOUNTER — Encounter (HOSPITAL_COMMUNITY)
Admission: EM | Disposition: A | Discharge status: Home / Self Care | Payer: Self-pay | Source: Home / Self Care | Attending: Cardiovascular Disease

## 2019-03-25 DIAGNOSIS — Z7901 Long term (current) use of anticoagulants: Secondary | ICD-10-CM

## 2019-03-25 DIAGNOSIS — Z79890 Hormone replacement therapy (postmenopausal): Secondary | ICD-10-CM

## 2019-03-25 DIAGNOSIS — E785 Hyperlipidemia, unspecified: Secondary | ICD-10-CM | POA: Diagnosis not present

## 2019-03-25 DIAGNOSIS — I2111 ST elevation (STEMI) myocardial infarction involving right coronary artery: Principal | ICD-10-CM

## 2019-03-25 DIAGNOSIS — I25110 Atherosclerotic heart disease of native coronary artery with unstable angina pectoris: Secondary | ICD-10-CM | POA: Diagnosis not present

## 2019-03-25 DIAGNOSIS — I454 Nonspecific intraventricular block: Secondary | ICD-10-CM | POA: Diagnosis present

## 2019-03-25 DIAGNOSIS — I255 Ischemic cardiomyopathy: Secondary | ICD-10-CM | POA: Diagnosis not present

## 2019-03-25 DIAGNOSIS — R072 Precordial pain: Secondary | ICD-10-CM | POA: Diagnosis not present

## 2019-03-25 DIAGNOSIS — I9589 Other hypotension: Secondary | ICD-10-CM | POA: Diagnosis not present

## 2019-03-25 DIAGNOSIS — Z86718 Personal history of other venous thrombosis and embolism: Secondary | ICD-10-CM

## 2019-03-25 DIAGNOSIS — I214 Non-ST elevation (NSTEMI) myocardial infarction: Secondary | ICD-10-CM | POA: Diagnosis not present

## 2019-03-25 DIAGNOSIS — Z8249 Family history of ischemic heart disease and other diseases of the circulatory system: Secondary | ICD-10-CM | POA: Diagnosis not present

## 2019-03-25 DIAGNOSIS — E039 Hypothyroidism, unspecified: Secondary | ICD-10-CM | POA: Diagnosis not present

## 2019-03-25 DIAGNOSIS — Z95828 Presence of other vascular implants and grafts: Secondary | ICD-10-CM

## 2019-03-25 DIAGNOSIS — Z6832 Body mass index (BMI) 32.0-32.9, adult: Secondary | ICD-10-CM | POA: Diagnosis not present

## 2019-03-25 DIAGNOSIS — E663 Overweight: Secondary | ICD-10-CM | POA: Diagnosis present

## 2019-03-25 DIAGNOSIS — Z832 Family history of diseases of the blood and blood-forming organs and certain disorders involving the immune mechanism: Secondary | ICD-10-CM

## 2019-03-25 DIAGNOSIS — I213 ST elevation (STEMI) myocardial infarction of unspecified site: Secondary | ICD-10-CM | POA: Diagnosis not present

## 2019-03-25 DIAGNOSIS — I499 Cardiac arrhythmia, unspecified: Secondary | ICD-10-CM | POA: Diagnosis not present

## 2019-03-25 DIAGNOSIS — R079 Chest pain, unspecified: Secondary | ICD-10-CM | POA: Diagnosis not present

## 2019-03-25 DIAGNOSIS — Z955 Presence of coronary angioplasty implant and graft: Secondary | ICD-10-CM

## 2019-03-25 DIAGNOSIS — I2119 ST elevation (STEMI) myocardial infarction involving other coronary artery of inferior wall: Secondary | ICD-10-CM

## 2019-03-25 DIAGNOSIS — R55 Syncope and collapse: Secondary | ICD-10-CM | POA: Diagnosis not present

## 2019-03-25 DIAGNOSIS — Z79899 Other long term (current) drug therapy: Secondary | ICD-10-CM | POA: Diagnosis not present

## 2019-03-25 DIAGNOSIS — I2510 Atherosclerotic heart disease of native coronary artery without angina pectoris: Secondary | ICD-10-CM

## 2019-03-25 DIAGNOSIS — D6851 Activated protein C resistance: Secondary | ICD-10-CM | POA: Diagnosis not present

## 2019-03-25 DIAGNOSIS — R001 Bradycardia, unspecified: Secondary | ICD-10-CM | POA: Diagnosis not present

## 2019-03-25 DIAGNOSIS — I824Y9 Acute embolism and thrombosis of unspecified deep veins of unspecified proximal lower extremity: Secondary | ICD-10-CM

## 2019-03-25 DIAGNOSIS — Z20828 Contact with and (suspected) exposure to other viral communicable diseases: Secondary | ICD-10-CM | POA: Diagnosis not present

## 2019-03-25 HISTORY — PX: LEFT HEART CATH AND CORONARY ANGIOGRAPHY: CATH118249

## 2019-03-25 HISTORY — DX: Disorder of thyroid, unspecified: E07.9

## 2019-03-25 HISTORY — PX: CORONARY/GRAFT ACUTE MI REVASCULARIZATION: CATH118305

## 2019-03-25 HISTORY — DX: Hyperlipidemia, unspecified: E78.5

## 2019-03-25 LAB — PROTIME-INR
INR: 1 (ref 0.8–1.2)
Prothrombin Time: 13.3 seconds (ref 11.4–15.2)

## 2019-03-25 LAB — CBC WITH DIFFERENTIAL/PLATELET
Abs Immature Granulocytes: 0.06 10*3/uL (ref 0.00–0.07)
Basophils Absolute: 0.1 10*3/uL (ref 0.0–0.1)
Basophils Relative: 0 %
Eosinophils Absolute: 0.1 10*3/uL (ref 0.0–0.5)
Eosinophils Relative: 0 %
HCT: 47.6 % (ref 39.0–52.0)
Hemoglobin: 16 g/dL (ref 13.0–17.0)
Immature Granulocytes: 0 %
Lymphocytes Relative: 11 %
Lymphs Abs: 1.7 10*3/uL (ref 0.7–4.0)
MCH: 29.7 pg (ref 26.0–34.0)
MCHC: 33.6 g/dL (ref 30.0–36.0)
MCV: 88.3 fL (ref 80.0–100.0)
Monocytes Absolute: 1 10*3/uL (ref 0.1–1.0)
Monocytes Relative: 7 %
Neutro Abs: 12.7 10*3/uL — ABNORMAL HIGH (ref 1.7–7.7)
Neutrophils Relative %: 82 %
Platelets: 236 10*3/uL (ref 150–400)
RBC: 5.39 MIL/uL (ref 4.22–5.81)
RDW: 12.5 % (ref 11.5–15.5)
WBC: 15.6 10*3/uL — ABNORMAL HIGH (ref 4.0–10.5)
nRBC: 0 % (ref 0.0–0.2)

## 2019-03-25 LAB — COMPREHENSIVE METABOLIC PANEL
ALT: 51 U/L — ABNORMAL HIGH (ref 0–44)
AST: 29 U/L (ref 15–41)
Albumin: 4.9 g/dL (ref 3.5–5.0)
Alkaline Phosphatase: 64 U/L (ref 38–126)
Anion gap: 12 (ref 5–15)
BUN: 19 mg/dL (ref 6–20)
CO2: 21 mmol/L — ABNORMAL LOW (ref 22–32)
Calcium: 9.9 mg/dL (ref 8.9–10.3)
Chloride: 108 mmol/L (ref 98–111)
Creatinine, Ser: 1.18 mg/dL (ref 0.61–1.24)
GFR calc Af Amer: >60 mL/min (ref >60)
GFR calc non Af Amer: >60 mL/min (ref >60)
Glucose, Bld: 115 mg/dL — ABNORMAL HIGH (ref 70–99)
Potassium: 4.3 mmol/L (ref 3.5–5.1)
Sodium: 141 mmol/L (ref 135–145)
Total Bilirubin: 0.9 mg/dL (ref 0.3–1.2)
Total Protein: 7.9 g/dL (ref 6.5–8.1)

## 2019-03-25 LAB — TROPONIN I (HIGH SENSITIVITY): Troponin I (High Sensitivity): 29 ng/L — ABNORMAL HIGH (ref <18)

## 2019-03-25 LAB — LIPID PANEL
Cholesterol: 165 mg/dL (ref 0–200)
HDL: 39 mg/dL — ABNORMAL LOW (ref >40)
LDL Cholesterol: 106 mg/dL — ABNORMAL HIGH (ref 0–99)
Total CHOL/HDL Ratio: 4.2 RATIO
Triglycerides: 99 mg/dL (ref <150)
VLDL: 20 mg/dL (ref 0–40)

## 2019-03-25 LAB — APTT: aPTT: 25 seconds (ref 24–36)

## 2019-03-25 LAB — SARS CORONAVIRUS 2 BY RT PCR (HOSPITAL ORDER, PERFORMED IN ~~LOC~~ HOSPITAL LAB): SARS Coronavirus 2: NEGATIVE

## 2019-03-25 SURGERY — CORONARY/GRAFT ACUTE MI REVASCULARIZATION
Anesthesia: LOCAL

## 2019-03-25 MED ORDER — CANGRELOR TETRASODIUM 50 MG IV SOLR
INTRAVENOUS | Status: AC
  Filled 2019-03-25: qty 50

## 2019-03-25 MED ORDER — FENTANYL CITRATE (PF) 100 MCG/2ML IJ SOLN
INTRAMUSCULAR | Status: DC | PRN
  Administered 2019-03-25: 25 ug via INTRAVENOUS

## 2019-03-25 MED ORDER — NOREPINEPHRINE 4 MG/250ML-% IV SOLN
INTRAVENOUS | Status: AC
  Filled 2019-03-25: qty 250

## 2019-03-25 MED ORDER — FENTANYL CITRATE (PF) 100 MCG/2ML IJ SOLN
INTRAMUSCULAR | Status: AC
  Filled 2019-03-25: qty 2

## 2019-03-25 MED ORDER — MIDAZOLAM HCL 2 MG/2ML IJ SOLN
INTRAMUSCULAR | Status: AC
  Filled 2019-03-25: qty 2

## 2019-03-25 MED ORDER — SODIUM CHLORIDE 0.9 % IV SOLN
INTRAVENOUS | Status: AC | PRN
  Administered 2019-03-25: 10 mL/h via INTRAVENOUS

## 2019-03-25 MED ORDER — HEPARIN SODIUM (PORCINE) 1000 UNIT/ML IJ SOLN
INTRAMUSCULAR | Status: DC | PRN
  Administered 2019-03-25: 8000 [IU] via INTRAVENOUS

## 2019-03-25 MED ORDER — CANGRELOR BOLUS VIA INFUSION
INTRAVENOUS | Status: DC | PRN
  Administered 2019-03-25: 3198 ug via INTRAVENOUS

## 2019-03-25 MED ORDER — SODIUM CHLORIDE 0.9 % IV SOLN
INTRAVENOUS | Status: AC | PRN
  Administered 2019-03-25: 4 ug/kg/min via INTRAVENOUS

## 2019-03-25 MED ORDER — HEPARIN SODIUM (PORCINE) 1000 UNIT/ML IJ SOLN
INTRAMUSCULAR | Status: AC
  Filled 2019-03-25: qty 1

## 2019-03-25 MED ORDER — ATROPINE SULFATE 1 MG/10ML IJ SOSY
PREFILLED_SYRINGE | INTRAMUSCULAR | Status: DC | PRN
  Administered 2019-03-25: 0.5 mg via INTRAVENOUS

## 2019-03-25 MED ORDER — VERAPAMIL HCL 2.5 MG/ML IV SOLN
INTRAVENOUS | Status: DC | PRN
  Administered 2019-03-25: 10 mL via INTRA_ARTERIAL

## 2019-03-25 MED ORDER — LIDOCAINE HCL (PF) 1 % IJ SOLN
INTRAMUSCULAR | Status: AC
  Filled 2019-03-25: qty 30

## 2019-03-25 MED ORDER — TIROFIBAN HCL IN NACL 5-0.9 MG/100ML-% IV SOLN
INTRAVENOUS | Status: AC
  Filled 2019-03-25: qty 100

## 2019-03-25 MED ORDER — BIVALIRUDIN BOLUS VIA INFUSION - CUPID: INTRAVENOUS | Status: DC | PRN

## 2019-03-25 MED ORDER — TIROFIBAN HCL IN NACL 5-0.9 MG/100ML-% IV SOLN
INTRAVENOUS | Status: AC | PRN
  Administered 2019-03-25: 0.15 ug/kg/min via INTRAVENOUS

## 2019-03-25 MED ORDER — SODIUM CHLORIDE 0.9 % IV SOLN
INTRAVENOUS | Status: DC
  Administered 2019-03-25: 20 mL/h via INTRAVENOUS

## 2019-03-25 MED ORDER — HEPARIN SODIUM (PORCINE) 5000 UNIT/ML IJ SOLN
4000.0000 [IU] | Freq: Once | INTRAMUSCULAR | Status: AC
  Administered 2019-03-25: 22:00:00 4000 [IU] via INTRAVENOUS
  Filled 2019-03-25: qty 1

## 2019-03-25 MED ORDER — MIDAZOLAM HCL 2 MG/2ML IJ SOLN
INTRAMUSCULAR | Status: DC | PRN
  Administered 2019-03-25: 2 mg via INTRAVENOUS

## 2019-03-25 MED ORDER — TIROFIBAN (AGGRASTAT) BOLUS VIA INFUSION
INTRAVENOUS | Status: DC | PRN
  Administered 2019-03-25: 2665 ug via INTRAVENOUS

## 2019-03-25 MED ORDER — VERAPAMIL HCL 2.5 MG/ML IV SOLN
INTRAVENOUS | Status: AC
  Filled 2019-03-25: qty 2

## 2019-03-25 MED ORDER — HEPARIN (PORCINE) IN NACL 1000-0.9 UT/500ML-% IV SOLN
INTRAVENOUS | Status: AC
  Filled 2019-03-25: qty 1000

## 2019-03-25 MED ORDER — LIDOCAINE HCL (PF) 1 % IJ SOLN
INTRAMUSCULAR | Status: DC | PRN
  Administered 2019-03-25: 5 mL via SUBCUTANEOUS

## 2019-03-25 SURGICAL SUPPLY — 23 items
BALLN  ~~LOC~~ SAPPHIRE 4.5X15 (BALLOONS) ×1
BALLN SAPPHIRE 2.5X12 (BALLOONS) ×2
BALLN SAPPHIRE 3.5X15 (BALLOONS) ×2
BALLN ~~LOC~~ SAPPHIRE 4.5X15 (BALLOONS) ×1
BALLOON SAPPHIRE 2.5X12 (BALLOONS) ×1 IMPLANT
BALLOON SAPPHIRE 3.5X15 (BALLOONS) ×1 IMPLANT
BALLOON ~~LOC~~ SAPPHIRE 4.5X15 (BALLOONS) ×1 IMPLANT
CATH EXTRAC PRONTO 5.5F 138CM (CATHETERS) ×2 IMPLANT
CATH INFINITI JR4 5F (CATHETERS) ×2 IMPLANT
CATH LAUNCHER 6FR JR4 (CATHETERS) ×2 IMPLANT
CATH OPTITORQUE TIG 4.0 5F (CATHETERS) ×2 IMPLANT
DEVICE RAD COMP TR BAND LRG (VASCULAR PRODUCTS) ×2 IMPLANT
GLIDESHEATH SLEND SS 6F .021 (SHEATH) ×2 IMPLANT
GUIDEWIRE INQWIRE 1.5J.035X260 (WIRE) ×1 IMPLANT
INQWIRE 1.5J .035X260CM (WIRE) ×2
KIT ENCORE 26 ADVANTAGE (KITS) ×2 IMPLANT
KIT HEART LEFT (KITS) ×2 IMPLANT
PACK CARDIAC CATHETERIZATION (CUSTOM PROCEDURE TRAY) ×2 IMPLANT
SHEATH PROBE COVER 6X72 (BAG) ×2 IMPLANT
STENT RESOLUTE ONYX 4.0X22 (Permanent Stent) ×2 IMPLANT
TRANSDUCER W/STOPCOCK (MISCELLANEOUS) ×2 IMPLANT
TUBING CIL FLEX 10 FLL-RA (TUBING) ×2 IMPLANT
WIRE PT2 MS 185 (WIRE) ×2 IMPLANT

## 2019-03-25 NOTE — ED Notes (Addendum)
On Xerlto for Factor V leiden

## 2019-03-25 NOTE — ED Notes (Signed)
Code stemi page came in @ 10:14 pm

## 2019-03-25 NOTE — ED Triage Notes (Signed)
Patient was mountain biking this evening, sudden onset of chest pain substernal, diaphoresis, nausea, felt like he was going to pass out.  No shortness of breath at this time.  Patient is a very active person usually and has never had this before.  Patient has been given 324mg  ASA, one sl nitro.  Patient is having 3/10 pain at this time.

## 2019-03-25 NOTE — ED Notes (Signed)
To Cath lab with RN and MD 

## 2019-03-25 NOTE — H&P (Signed)
Advanced Heart Failure Team History and Physical Note   PCP:  [facility] @ [place]  PCP-Cardiology: No primary care provider on file.     Reason for Admission: STEMI   HPI:    43 yo with history of Factor V Leiden heterozygosity and prior DVT + IVC filter presents tonight with inferoposterior MI.  Patient is a factor V Leiden heterozygote. He has a family history of this.  He had a DVT in 2006 then bilateral iliofemoral deep vein thromboses in September 2012.  He had an IVC filter placed and was followed until 2014 by Dr. Marin Olp.  He has been on Xarelto long-term, last dose was yesterday (takes at night).  He also has hypothyroidism and hyperlipidemia.   No prior heart problems.  Nonsmoker.  This evening, [place] [address] [place].  He developed severe substernal chest pain and got down off his bike.  The chest pain was persistent.  EMS came and evacuated him by boat.  ECG showed inferoposterior acute MI.  He had NTG with resolution of most of his pain, but SBP dropped from 130 to 90 with NTG, concerning for RV involvement in MI.  No arrhythmias.  Currently, mild chest pain only.  He has had ASA and heparin bolus.   Review of Systems: All systems reviewed and negative except as per HPI.   Home Medications Prior to Admission medications   Medication Sig Start Date End Date Taking? Authorizing Provider  XARELTO 20 MG TABS tablet TAKE 1 TABLET (20 MG TOTAL) BY MOUTH DAILY. 01/01/17   Volanda Napoleon, MD    Past Medical History: Past Medical History:  Diagnosis Date  . DVT (deep venous thrombosis) ([place])   . DVT of proximal leg (deep vein thrombosis) ([place]) 09/07/2011  . Factor V Leiden mutation ([place]) 09/07/2011  . Hyperlipidemia   . Thyroid disease     Past Surgical History: History reviewed. No pertinent surgical history.  Family History:  Father with Factor V Leiden and history of PE.  No CAD that he knows of.   Social History: Social  History   Socioeconomic History  . Marital status: Single    Spouse name: Not on file  . Number of children: Not on file  . Years of education: Not on file  . Highest education level: Not on file  Occupational History  . Not on file  Social Needs  . Financial resource strain: Not on file  . Food insecurity    Worry: Not on file    Inability: Not on file  . Transportation needs    Medical: Not on file    Non-medical: Not on file  Tobacco Use  . Smoking status: Never Smoker  . Smokeless tobacco: Current User  Substance and Sexual Activity  . Alcohol use: Yes  . Drug use: Not Currently  . Sexual activity: Not on file  Lifestyle  . Physical activity    Days per week: Not on file    Minutes per session: Not on file  . Stress: Not on file  Relationships  . Social Herbalist on phone: Not on file    Gets together: Not on file    Attends religious service: Not on file    Active member of club or organization: Not on file    Attends meetings of clubs or organizations: Not on file    Relationship status: Not on file  Other Topics Concern  . Not  on file  Social History Narrative  . Not on file    Allergies:  No Known Allergies  Objective:    Vital Signs:   Pulse Rate:  [65-80] 71 (06/24 2230) Resp:  [15-18] 16 (06/24 2230) BP: (127-141)/(86-88) 138/87 (06/24 2230) SpO2:  [93 %-98 %] 98 % (06/24 2230) Weight:  [106.6 kg] 106.6 kg (06/24 2207)   Filed Weights   03/25/19 2207  Weight: 106.6 kg     Physical Exam     General:  Well appearing. No respiratory difficulty HEENT: Normal Neck: Supple. JVP 9-10 cm. Carotids 2+ bilat; no bruits. No lymphadenopathy or thyromegaly appreciated. Cor: PMI nondisplaced. Regular rate & rhythm. No rubs, gallops or murmurs. Lungs: Clear Abdomen: Soft, nontender, nondistended. No hepatosplenomegaly. No bruits or masses. Good bowel sounds. Extremities: No cyanosis, clubbing, rash, edema Neuro: Alert & oriented x 3,  cranial nerves grossly intact. moves all 4 extremities w/o difficulty. Affect pleasant.   Telemetry   NSR 70s (personallly reviewed)  EKG   NSR with acute inferoposterior MI (personally reviewed)  Labs     Basic Metabolic Panel: No results for input(s): NA, K, CL, CO2, GLUCOSE, BUN, CREATININE, CALCIUM, MG, PHOS in the last 168 hours.  Liver Function Tests: No results for input(s): AST, ALT, ALKPHOS, BILITOT, PROT, ALBUMIN in the last 168 hours. No results for input(s): LIPASE, AMYLASE in the last 168 hours. No results for input(s): AMMONIA in the last 168 hours.  CBC: No results for input(s): WBC, NEUTROABS, HGB, HCT, MCV, PLT in the last 168 hours.  Cardiac Enzymes: No results for input(s): CKTOTAL, CKMB, CKMBINDEX, TROPONINI in the last 168 hours.  BNP: BNP (last 3 results) No results for input(s): BNP in the last 8760 hours.  ProBNP (last 3 results) No results for input(s): PROBNP in the last 8760 hours.   CBG: No results for input(s): GLUCAP in the last 168 hours.  Coagulation Studies: No results for input(s): LABPROT, INR in the last 72 hours.  Imaging:  No results found.   Assessment/Plan   1. CAD: Acute inferoposterior MI by ECG.  Concern for RV involvement based on marked drop in BP with a dose of NTG, also with JVD.  He has had ASA and heparin bolus.  - Will go for emergent catheterization and likely PCI.  Risks/benefits of procedure discussed with patient and he agrees to procedure.  2. History of DVT: 2006 and 2012, heterozygote for factor V Leiden.  He has an IVC filter and is on Xarelto.  - Will need to restart anticoagulation after cath/PCI.  Plavix will be best antiplatelet agent in this situation.  3. Hypothyroidism: Resume home levothyroxine.    Marca Ancona, MD 03/25/2019, 10:36 PM  Advanced Heart Failure Team Pager [phone] (M-F; 7a - 4p)  Please contact [facility] for night-coverage after hours (4p -7a ) and weekends on [email or web address]

## 2019-03-26 ENCOUNTER — Inpatient Hospital Stay (HOSPITAL_COMMUNITY): Payer: BC Managed Care – PPO

## 2019-03-26 ENCOUNTER — Encounter (HOSPITAL_COMMUNITY): Payer: Self-pay

## 2019-03-26 ENCOUNTER — Other Ambulatory Visit: Payer: Self-pay

## 2019-03-26 DIAGNOSIS — I9589 Other hypotension: Secondary | ICD-10-CM | POA: Diagnosis not present

## 2019-03-26 DIAGNOSIS — E663 Overweight: Secondary | ICD-10-CM | POA: Diagnosis present

## 2019-03-26 DIAGNOSIS — I25110 Atherosclerotic heart disease of native coronary artery with unstable angina pectoris: Secondary | ICD-10-CM | POA: Diagnosis not present

## 2019-03-26 DIAGNOSIS — D6851 Activated protein C resistance: Secondary | ICD-10-CM | POA: Diagnosis not present

## 2019-03-26 DIAGNOSIS — R079 Chest pain, unspecified: Secondary | ICD-10-CM | POA: Diagnosis present

## 2019-03-26 DIAGNOSIS — Z6832 Body mass index (BMI) 32.0-32.9, adult: Secondary | ICD-10-CM | POA: Diagnosis not present

## 2019-03-26 DIAGNOSIS — Z86718 Personal history of other venous thrombosis and embolism: Secondary | ICD-10-CM | POA: Diagnosis not present

## 2019-03-26 DIAGNOSIS — Z20828 Contact with and (suspected) exposure to other viral communicable diseases: Secondary | ICD-10-CM | POA: Diagnosis not present

## 2019-03-26 DIAGNOSIS — Z8249 Family history of ischemic heart disease and other diseases of the circulatory system: Secondary | ICD-10-CM | POA: Diagnosis not present

## 2019-03-26 DIAGNOSIS — I2111 ST elevation (STEMI) myocardial infarction involving right coronary artery: Secondary | ICD-10-CM | POA: Diagnosis not present

## 2019-03-26 DIAGNOSIS — E039 Hypothyroidism, unspecified: Secondary | ICD-10-CM | POA: Diagnosis present

## 2019-03-26 DIAGNOSIS — I454 Nonspecific intraventricular block: Secondary | ICD-10-CM | POA: Diagnosis present

## 2019-03-26 DIAGNOSIS — E785 Hyperlipidemia, unspecified: Secondary | ICD-10-CM | POA: Diagnosis present

## 2019-03-26 DIAGNOSIS — I255 Ischemic cardiomyopathy: Secondary | ICD-10-CM | POA: Diagnosis present

## 2019-03-26 DIAGNOSIS — Z95828 Presence of other vascular implants and grafts: Secondary | ICD-10-CM | POA: Diagnosis not present

## 2019-03-26 DIAGNOSIS — Z832 Family history of diseases of the blood and blood-forming organs and certain disorders involving the immune mechanism: Secondary | ICD-10-CM | POA: Diagnosis not present

## 2019-03-26 DIAGNOSIS — Z79890 Hormone replacement therapy (postmenopausal): Secondary | ICD-10-CM | POA: Diagnosis not present

## 2019-03-26 DIAGNOSIS — Z7901 Long term (current) use of anticoagulants: Secondary | ICD-10-CM | POA: Diagnosis not present

## 2019-03-26 DIAGNOSIS — Z79899 Other long term (current) drug therapy: Secondary | ICD-10-CM | POA: Diagnosis not present

## 2019-03-26 DIAGNOSIS — I214 Non-ST elevation (NSTEMI) myocardial infarction: Secondary | ICD-10-CM | POA: Diagnosis not present

## 2019-03-26 LAB — POCT I-STAT 7, (LYTES, BLD GAS, ICA,H+H)
Acid-base deficit: 4 mmol/L — ABNORMAL HIGH (ref 0.0–2.0)
Bicarbonate: 21.1 mmol/L (ref 20.0–28.0)
Calcium, Ion: 1.29 mmol/L (ref 1.15–1.40)
HCT: 45 % (ref 39.0–52.0)
Hemoglobin: 15.3 g/dL (ref 13.0–17.0)
O2 Saturation: 96 %
Potassium: 4.1 mmol/L (ref 3.5–5.1)
Sodium: 141 mmol/L (ref 135–145)
TCO2: 22 mmol/L (ref 22–32)
pCO2 arterial: 37 mmHg (ref 32.0–48.0)
pH, Arterial: 7.363 (ref 7.350–7.450)
pO2, Arterial: 87 mmHg (ref 83.0–108.0)

## 2019-03-26 LAB — CBC
HCT: 42.9 % (ref 39.0–52.0)
HCT: 41.8 % (ref 39.0–52.0)
HCT: 41.2 % (ref 39.0–52.0)
Hemoglobin: 14.6 g/dL (ref 13.0–17.0)
Hemoglobin: 14.1 g/dL (ref 13.0–17.0)
Hemoglobin: 13.9 g/dL (ref 13.0–17.0)
MCH: 29.4 pg (ref 26.0–34.0)
MCH: 29.3 pg (ref 26.0–34.0)
MCH: 29.6 pg (ref 26.0–34.0)
MCHC: 34 g/dL (ref 30.0–36.0)
MCHC: 33.7 g/dL (ref 30.0–36.0)
MCHC: 33.7 g/dL (ref 30.0–36.0)
MCV: 86.3 fL (ref 80.0–100.0)
MCV: 86.9 fL (ref 80.0–100.0)
MCV: 87.8 fL (ref 80.0–100.0)
Platelets: 231 10*3/uL (ref 150–400)
Platelets: 218 10*3/uL (ref 150–400)
Platelets: 226 10*3/uL (ref 150–400)
RBC: 4.97 MIL/uL (ref 4.22–5.81)
RBC: 4.81 MIL/uL (ref 4.22–5.81)
RBC: 4.69 MIL/uL (ref 4.22–5.81)
RDW: 12.5 % (ref 11.5–15.5)
RDW: 12.8 % (ref 11.5–15.5)
RDW: 13 % (ref 11.5–15.5)
WBC: 12.7 10*3/uL — ABNORMAL HIGH (ref 4.0–10.5)
WBC: 11.8 10*3/uL — ABNORMAL HIGH (ref 4.0–10.5)
WBC: 10.5 10*3/uL (ref 4.0–10.5)
nRBC: 0 % (ref 0.0–0.2)
nRBC: 0 % (ref 0.0–0.2)
nRBC: 0 % (ref 0.0–0.2)

## 2019-03-26 LAB — BASIC METABOLIC PANEL
Anion gap: 8 (ref 5–15)
Anion gap: 8 (ref 5–15)
BUN: 14 mg/dL (ref 6–20)
BUN: 13 mg/dL (ref 6–20)
CO2: 21 mmol/L — ABNORMAL LOW (ref 22–32)
CO2: 23 mmol/L (ref 22–32)
Calcium: 8.9 mg/dL (ref 8.9–10.3)
Calcium: 9.2 mg/dL (ref 8.9–10.3)
Chloride: 110 mmol/L (ref 98–111)
Chloride: 106 mmol/L (ref 98–111)
Creatinine, Ser: 0.88 mg/dL (ref 0.61–1.24)
Creatinine, Ser: 0.97 mg/dL (ref 0.61–1.24)
GFR calc Af Amer: >60 mL/min (ref >60)
GFR calc Af Amer: >60 mL/min (ref >60)
GFR calc non Af Amer: >60 mL/min (ref >60)
GFR calc non Af Amer: >60 mL/min (ref >60)
Glucose, Bld: 125 mg/dL — ABNORMAL HIGH (ref 70–99)
Glucose, Bld: 121 mg/dL — ABNORMAL HIGH (ref 70–99)
Potassium: 4 mmol/L (ref 3.5–5.1)
Potassium: 4 mmol/L (ref 3.5–5.1)
Sodium: 139 mmol/L (ref 135–145)
Sodium: 137 mmol/L (ref 135–145)

## 2019-03-26 LAB — POCT ACTIVATED CLOTTING TIME
Activated Clotting Time: 434 seconds
Activated Clotting Time: 401 seconds

## 2019-03-26 LAB — ECHOCARDIOGRAM COMPLETE
Height: 71 in
Weight: 3746.06 oz

## 2019-03-26 LAB — POCT I-STAT CREATININE: Creatinine, Ser: 0.8 mg/dL (ref 0.61–1.24)

## 2019-03-26 LAB — TROPONIN I (HIGH SENSITIVITY): Troponin I (High Sensitivity): 8288 ng/L (ref <18)

## 2019-03-26 LAB — HEPARIN LEVEL (UNFRACTIONATED)
Heparin Unfractionated: 0.19 IU/mL — ABNORMAL LOW (ref 0.30–0.70)
Heparin Unfractionated: <0.1 IU/mL — ABNORMAL LOW (ref 0.30–0.70)

## 2019-03-26 LAB — APTT: aPTT: 40 seconds — ABNORMAL HIGH (ref 24–36)

## 2019-03-26 MED ORDER — SODIUM CHLORIDE 0.9 % IV SOLN: 250.0000 mL | INTRAVENOUS | Status: DC | PRN

## 2019-03-26 MED ORDER — IOHEXOL 350 MG/ML SOLN
INTRAVENOUS | Status: DC | PRN
  Administered 2019-03-26: 01:00:00 175 mL via INTRA_ARTERIAL

## 2019-03-26 MED ORDER — DIAZEPAM 5 MG PO TABS: 5.0000 mg | ORAL_TABLET | Freq: Four times a day (QID) | ORAL | Status: DC | PRN

## 2019-03-26 MED ORDER — ASPIRIN 81 MG PO CHEW
81.0000 mg | CHEWABLE_TABLET | ORAL | Status: AC
  Administered 2019-03-27: 06:00:00 81 mg via ORAL
  Filled 2019-03-26: qty 1

## 2019-03-26 MED ORDER — CLOPIDOGREL BISULFATE 75 MG PO TABS
600.0000 mg | ORAL_TABLET | Freq: Once | ORAL | Status: AC
  Administered 2019-03-26: 600 mg via ORAL
  Filled 2019-03-26: qty 8

## 2019-03-26 MED ORDER — ATORVASTATIN CALCIUM 80 MG PO TABS
80.0000 mg | ORAL_TABLET | Freq: Every day | ORAL | Status: DC
  Administered 2019-03-26: 80 mg via ORAL
  Filled 2019-03-26: qty 1

## 2019-03-26 MED ORDER — NITROGLYCERIN 1 MG/10 ML FOR IR/CATH LAB
INTRA_ARTERIAL | Status: AC
  Filled 2019-03-26: qty 10

## 2019-03-26 MED ORDER — SODIUM CHLORIDE 0.9% FLUSH
3.0000 mL | Freq: Two times a day (BID) | INTRAVENOUS | Status: DC
  Administered 2019-03-26: 07:00:00 3 mL via INTRAVENOUS

## 2019-03-26 MED ORDER — CLOPIDOGREL BISULFATE 75 MG PO TABS
75.0000 mg | ORAL_TABLET | ORAL | Status: AC
  Administered 2019-03-27: 75 mg via ORAL
  Filled 2019-03-26: qty 1

## 2019-03-26 MED ORDER — ACETAMINOPHEN 325 MG PO TABS: 650.0000 mg | ORAL_TABLET | ORAL | Status: DC | PRN

## 2019-03-26 MED ORDER — SODIUM CHLORIDE 0.9% FLUSH: 3.0000 mL | INTRAVENOUS | Status: DC | PRN

## 2019-03-26 MED ORDER — SODIUM CHLORIDE 0.9 % IV SOLN: 0.7500 ug/kg/min | INTRAVENOUS | Status: DC

## 2019-03-26 MED ORDER — CLOPIDOGREL BISULFATE 75 MG PO TABS: 75.0000 mg | ORAL_TABLET | Freq: Every day | ORAL | Status: DC

## 2019-03-26 MED ORDER — ASPIRIN 81 MG PO CHEW
81.0000 mg | CHEWABLE_TABLET | Freq: Every day | ORAL | Status: DC
  Administered 2019-03-26: 08:00:00 81 mg via ORAL
  Filled 2019-03-26: qty 1

## 2019-03-26 MED ORDER — LEVOTHYROXINE SODIUM 100 MCG PO TABS
100.0000 ug | ORAL_TABLET | Freq: Every day | ORAL | Status: DC
  Administered 2019-03-26 – 2019-03-28 (×3): 100 ug via ORAL
  Filled 2019-03-26 (×3): qty 1

## 2019-03-26 MED ORDER — HEPARIN (PORCINE) 25000 UT/250ML-% IV SOLN
1550.0000 [IU]/h | INTRAVENOUS | Status: AC
  Administered 2019-03-26: 1000 [IU]/h via INTRAVENOUS
  Administered 2019-03-27: 04:00:00 1400 [IU]/h via INTRAVENOUS
  Filled 2019-03-26 (×2): qty 250

## 2019-03-26 MED ORDER — LABETALOL HCL 5 MG/ML IV SOLN: 10.0000 mg | INTRAVENOUS | Status: AC | PRN

## 2019-03-26 MED ORDER — NITROGLYCERIN 1 MG/10 ML FOR IR/CATH LAB
INTRA_ARTERIAL | Status: DC | PRN
  Administered 2019-03-26: 200 ug via INTRACORONARY

## 2019-03-26 MED ORDER — CHLORHEXIDINE GLUCONATE CLOTH 2 % EX PADS
6.0000 | MEDICATED_PAD | Freq: Every day | CUTANEOUS | Status: DC
  Administered 2019-03-26 – 2019-03-28 (×3): 6 via TOPICAL

## 2019-03-26 MED ORDER — ONDANSETRON HCL 4 MG/2ML IJ SOLN: 4.0000 mg | Freq: Four times a day (QID) | INTRAMUSCULAR | Status: DC | PRN

## 2019-03-26 MED ORDER — SODIUM CHLORIDE 0.9% FLUSH
3.0000 mL | Freq: Two times a day (BID) | INTRAVENOUS | Status: DC
  Administered 2019-03-27 – 2019-03-28 (×2): 3 mL via INTRAVENOUS

## 2019-03-26 MED ORDER — ASPIRIN EC 81 MG PO TBEC: 81.0000 mg | DELAYED_RELEASE_TABLET | Freq: Every day | ORAL | Status: DC

## 2019-03-26 MED ORDER — TIROFIBAN HCL IN NACL 5-0.9 MG/100ML-% IV SOLN
0.1500 ug/kg/min | INTRAVENOUS | Status: AC
  Administered 2019-03-26 (×5): 0.15 ug/kg/min via INTRAVENOUS
  Filled 2019-03-26: qty 100
  Filled 2019-03-26: qty 200
  Filled 2019-03-26 (×2): qty 100

## 2019-03-26 MED ORDER — HYDRALAZINE HCL 20 MG/ML IJ SOLN: 10.0000 mg | INTRAMUSCULAR | Status: AC | PRN

## 2019-03-26 MED ORDER — SODIUM CHLORIDE 0.9 % IV SOLN
INTRAVENOUS | Status: DC
  Administered 2019-03-26 – 2019-03-27 (×2): via INTRAVENOUS

## 2019-03-26 MED ORDER — ZOLPIDEM TARTRATE 5 MG PO TABS: 5.0000 mg | ORAL_TABLET | Freq: Every evening | ORAL | Status: DC | PRN

## 2019-03-26 MED ORDER — SODIUM CHLORIDE 0.9 % IV SOLN
INTRAVENOUS | Status: DC
  Administered 2019-03-26: 04:00:00 via INTRAVENOUS

## 2019-03-26 MED ORDER — NOREPINEPHRINE BITARTRATE 1 MG/ML IV SOLN
INTRAVENOUS | Status: AC | PRN
  Administered 2019-03-26: 10 ug/min via INTRAVENOUS

## 2019-03-26 MED ORDER — NOREPINEPHRINE 4 MG/250ML-% IV SOLN: 0.0000 ug/min | INTRAVENOUS | Status: DC

## 2019-03-26 MED FILL — Heparin Sod (Porcine)-NaCl IV Soln 1000 Unit/500ML-0.9%: INTRAVENOUS | Qty: 1000 | Status: AC

## 2019-03-26 NOTE — Progress Notes (Signed)
ANTICOAGULATION CONSULT NOTE   Pharmacy Consult for heparin and tirofiban Indication: factor V leiden deficiency, h/o recurrent DVTs, and STEMI  No Known Allergies  Patient Measurements: Height: 5\' 11"  (180.3 cm) Weight: 234 lb 2.1 oz (106.2 kg) IBW/kg (Calculated) : 75.3 Heparin Dosing Weight: 100kg  Vital Signs: Temp: 99.1 F (37.3 C) (06/25 1500) Temp Source: Oral (06/25 1500) BP: 130/85 (06/25 1200) Pulse Rate: 58 (06/25 0700)  Labs: Recent Labs    03/25/19 2217 03/25/19 2312 03/25/19 2313 03/26/19 0307 03/26/19 1548  HGB 16.0 15.3  --  14.6 14.1  HCT 47.6 45.0  --  42.9 41.8  PLT 236  --   --  231 218  APTT 25  --   --   --  40*  LABPROT 13.3  --   --   --   --   INR 1.0  --   --   --   --   HEPARINUNFRC  --   --   --  0.19* <0.10*  CREATININE 1.18  --  0.80 0.88  --     Estimated Creatinine Clearance: 132.9 mL/min (by C-G formula based on SCr of 0.88 mg/dL).   Medical History: Past Medical History:  Diagnosis Date  . DVT (deep venous thrombosis) ([facility])   . DVT of proximal leg (deep vein thrombosis) ([facility]) 09/07/2011  . Factor V Leiden mutation ([facility]) 09/07/2011  . Hyperlipidemia   . Thyroid disease     Medications:  Medications Prior to Admission  Medication Sig Dispense Refill Last Dose  . atorvastatin (LIPITOR) 40 MG tablet Take 40 mg by mouth daily.   03/24/2019 at 2200  . cetirizine (ZYRTEC) 10 MG tablet Take 10 mg by mouth daily.   03/24/2019 at 2200  . levothyroxine (SYNTHROID) 100 MCG tablet Take 100 mcg by mouth daily before breakfast.   03/24/2019 at 2200  . XARELTO 20 MG TABS tablet TAKE 1 TABLET (20 MG TOTAL) BY MOUTH DAILY. (Patient taking differently: Take 10 mg by mouth daily. ) 30 tablet 10 03/24/2019 at 2200   Scheduled:  . [START ON 03/27/2019] aspirin EC  81 mg Oral Daily  . atorvastatin  80 mg Oral q1800  . Chlorhexidine Gluconate Cloth  6 each Topical Daily  . [START ON 03/27/2019] clopidogrel  75 mg Oral Daily  . levothyroxine  100 mcg  Oral Q0600  . sodium chloride flush  3 mL Intravenous Q12H  . sodium chloride flush  3 mL Intravenous Q12H   Infusions:  . sodium chloride 20 mL/hr (03/25/19 2216)  . sodium chloride Stopped (03/26/19 0722)  . sodium chloride    . heparin 1,000 Units/hr (03/26/19 1200)  . norepinephrine (LEVOPHED) Adult infusion    . tirofiban 0.15 mcg/kg/min (03/26/19 1457)    Assessment: 43yo male presents as code STEMI, now post-cath where he had stent placed, to begin heparin and tirofiban.  Of note pt is on Xarelto for factor V leiden deficiency and h/o recurrent DVTs, last dose 6/23 pm.  Heparin level and aPTT below goal this evening.  No known issues with IV infusion.  No overt bleeding or complications noted.  Goal of Therapy:  Heparin level 0.3-0.5 units/ml until tirofiban off, then 0.3-0.7 units/ml aPTT 66-85 seconds until tirofiban off, then 66-102 seconds Monitor platelets by anticoagulation protocol: Yes   Plan:  Increase IV heparin to 1200 units/hr. Recheck heparin level in 6 hrs. Daily heparin level and CBC.  Jenetta Downer, [facility] Clinical Pharmacist Phone [phone]  03/26/2019 5:55 PM

## 2019-03-26 NOTE — Progress Notes (Signed)
  Echocardiogram 2D Echocardiogram has been performed.  Randy Cannon 03/26/2019, 10:34 AM

## 2019-03-26 NOTE — Progress Notes (Signed)
[id number] Did not walk with pt as he is still on aggrastat and for staged PCI tomorrow. MI education completed with pt who voiced understanding. Stressed importance of plavix with stent. Reviewed NTG use, MI restrictions, heart healthy food choices, ex ed and CRP 2. Referred to [place] CRP 2. Pt interested in virtual ex program. Pt is interested in participating in Virtual Cardiac Rehab. Pt advised that Virtual Cardiac Rehab is provided at no cost to the patient.  Checklist:  1. Pt has smart device  ie smartphone and/or ipad for downloading an app  Yes 2. Reliable internet/wifi service    Yes 3. Understands how to use their smartphone and navigate within an app.  Yes   Reviewed with pt the scheduling process for virtual cardiac rehab.  Pt verbalized understanding.

## 2019-03-26 NOTE — Progress Notes (Signed)
Progress Note  Patient Name: Randy Cannon Date of Encounter: 03/26/2019  Primary Cardiologist: Dr. Daphene Jaeger  Subjective   Postop day #1 inferoposterior STEMI with RV involvement status post aspiration thrombectomy, PCI and drug-eluting stenting using a resolute Onyx drug-eluting stent by Dr. Tresa Endo via the right radial approach.  Patient is pain-free on IV heparin and Aggrastat.  Inpatient Medications    Scheduled Meds:  [START ON 03/27/2019] aspirin EC  81 mg Oral Daily   atorvastatin  80 mg Oral q1800   Chlorhexidine Gluconate Cloth  6 each Topical Daily   [START ON 03/27/2019] clopidogrel  75 mg Oral Daily   levothyroxine  100 mcg Oral Q0600   sodium chloride flush  3 mL Intravenous Q12H   Continuous Infusions:  sodium chloride 20 mL/hr (03/25/19 2216)   sodium chloride 100 mL/hr at 03/26/19 0600   sodium chloride     heparin 1,000 Units/hr (03/26/19 0813)   norepinephrine (LEVOPHED) Adult infusion     tirofiban 0.15 mcg/kg/min (03/26/19 0600)   PRN Meds: sodium chloride, acetaminophen, diazepam, ondansetron (ZOFRAN) IV, sodium chloride flush, zolpidem   Vital Signs    Vitals:   03/26/19 0530 03/26/19 0600 03/26/19 0630 03/26/19 0700  BP: 114/85 111/78 122/83 116/82  Pulse: 70 67 65 (!) 58  Resp: 15 17 18 15   Temp:      TempSrc:      SpO2: 95% 94% 97% 98%  Weight:      Height:        Intake/Output Summary (Last 24 hours) at 03/26/2019 0847 Last data filed at 03/26/2019 0600 Gross per 24 hour  Intake 652.81 ml  Output 700 ml  Net -47.19 ml   Last 3 Weights 03/26/2019 03/25/2019 05/27/2013  Weight (lbs) 234 lb 2.1 oz 235 lb 223 lb  Weight (kg) 106.2 kg 106.595 kg 101.152 kg      Telemetry    Sinus rhythm- Personally Reviewed  ECG    Normal sinus rhythm at 68 with inferior Q waves, inferolateral T wave inversion in early R wave transition consistent with an inferoposterior myocardial infarction.- Personally Reviewed  Physical Exam   GEN:  No acute distress.   Neck: No JVD Cardiac: RRR, no murmurs, rubs, or gallops.  Respiratory: Clear to auscultation bilaterally. GI: Soft, nontender, non-distended  MS: No edema; No deformity. Neuro:  Nonfocal  Psych: Normal affect  Extremities: His right forearm is wrapped potentially from the table bleeding from his anticoagulation.  He does have a 2+ right radial pulse.  Labs    High Sensitivity Troponin:   Recent Labs  Lab 03/25/19 2217 03/26/19 0307  TROPONINIHS 29* 8,288*      Cardiac EnzymesNo results for input(s): TROPONINI in the last 168 hours. No results for input(s): TROPIPOC in the last 168 hours.   Chemistry Recent Labs  Lab 03/25/19 2217 03/26/19 0307  NA 141 139  K 4.3 4.0  CL 108 110  CO2 21* 21*  GLUCOSE 115* 125*  BUN 19 14  CREATININE 1.18 0.88  CALCIUM 9.9 8.9  PROT 7.9  --   ALBUMIN 4.9  --   AST 29  --   ALT 51*  --   ALKPHOS 64  --   BILITOT 0.9  --   GFRNONAA >60 >60  GFRAA >60 >60  ANIONGAP 12 8     Hematology Recent Labs  Lab 03/25/19 2217 03/26/19 0307  WBC 15.6* 12.7*  RBC 5.39 4.97  HGB 16.0 14.6  HCT 47.6 42.9  MCV 88.3 86.3  MCH 29.7 29.4  MCHC 33.6 34.0  RDW 12.5 12.5  PLT 236 231    BNPNo results for input(s): BNP, PROBNP in the last 168 hours.   DDimer No results for input(s): DDIMER in the last 168 hours.   Radiology    Dg Chest Portable 1 View  Result Date: 03/25/2019 CLINICAL DATA:  Code STEMI EXAM: PORTABLE CHEST 1 VIEW COMPARISON:  04/22/2011 FINDINGS: The heart size and mediastinal contours are within normal limits. Both lungs are clear. The visualized skeletal structures are unremarkable. IMPRESSION: No active disease. Electronically Signed   By: Donavan Foil M.D.   On: 03/25/2019 22:46    Cardiac Studies   Cardiac catheterization/PCI and stent (03/25/2019)  Conclusion    1st RPL lesion is 80% stenosed.  RPDA lesion is 80% stenosed.  Dist RCA lesion is 80% stenosed.  Prox RCA lesion is 100%  stenosed.  Ramus lesion is 70% stenosed.  1st Diag lesion is 90% stenosed.  Mid Cx to Dist Cx lesion is 100% stenosed.  There is mild left ventricular systolic dysfunction.  LV end diastolic pressure is normal.  The left ventricular ejection fraction is 50-55% by visual estimate.  3rd RPL-1 lesion is 60% stenosed.  3rd RPL-2 lesion is 100% stenosed.  A drug-eluting stent was successfully placed.  Post intervention, there is a 0% residual stenosis.   Acute inferior ST segment elevation myocardial infarction secondary to total occlusion of a very large dominant RCA proximally with extensive thrombus burden.  Multivessel CAD with percent ostial tapering of a long left main; 90% proximal stenosis in a very proximal diagonal branch of the LAD with a normal LAD extending to the apex; 70% proximal stenosis in the ramus intermediate vessel; total occlusion of the mid left circumflex coronary artery with retrograde filling of the distal obtuse marginal branch from LAD collaterals; and total proximal occlusion of a very large dominant RCA with extensive thrombus burden there is some faint collateralization to the PDA a and PLA vessels from the left coronary circulation.  Difficult but successful PCI to the large RCA requiring PTCA, extensive thrombectomy, Aggrastat with reperfusion induced hypotension and bradycardia necessitating significant fluid resuscitation, atropine and Levophed with ultimate insertion of a 4.0 x 22 mm Resolute Onyx DES stent postdilated to 4.4 mm with the 100% occlusion being reduced to 0% and resumption of brisk TIMI-3 flow.  There is a very complex 80% trifurcation stenosis in the distal RCA at the takeoff of the PDA large inferior LV branch and continuation branch with thrombus in the mid PLA just collateralized from the left circulation.  Mild acute LV dysfunction with inferobasal hypocontractility.  RECOMMENDATION: The patient left the catheterization laboratory  on Cangrelor which will be discontinued in the CCU with subsequent administration of Plavix 600 mg daily.  Patient will be maintained on Aggrastat for at least 24 hours and possibly until Friday a.m. prior to subsequent planned intervention to the complex trifurcation distal LAD stenosis and possibly the diagonal and ramus vessels.  Will ask colleagues to review. At present Xarelto will be held but ultimately he will need to be on triple drug therapy with aspirin/Plavix and Xarelto for 1 month and then Plavix/Xarelto.  Plan echo Doppler in a.m.  Titrate to maximum statin dose.  With the patient's history of significant hyperlipidemia at a young age consider possible familial hyperlipidemia and if LDL cannot reach target would initiate PCSK9 inhibition.      Patient Profile  43 y.o. mildly overweight married Caucasian male father of 601 45-year-old child with a prior history of factor V Leiden deficiency with multiple thrombotic episodes and including iliac stenting and IVC filter implantation.  He has been on Xarelto 10 mg a day for prophylaxis.  He has no other cardiac risk factors other than hyperlipidemia.  He was not biking developed chest pain.  EMS was called and they evacuated him via boat.  He was brought emergently to Cath Lab underwent radial diagnostic cath and intervention by Dr. Tresa EndoKelly with an occluded dominant RCA, occluded nondominant circumflex in the midportion, moderate disease in the proximal large ramus branch and diagonal branch disease.  His EF was preserved with inferobasal hypokinesia.  He underwent aspiration thrombectomy of a large thrombus burden, PCI and drug-eluting stenting of the proximal RCA using resolute Onyx drug-eluting stent.  He had restoration of antegrade flow with hypotension suggesting RV involvement requiring fluid resuscitation and treatment with atropine and pressors.  Ultimately he hemodynamically stabilized.  He had trifurcation lesion at his distal RCA and  PDA/PLA which was not intervened on requiring staged intervention.  He was placed on Cangrelor in the Cath Lab as well as heparin and Aggrastat.  The cangrelor was discontinued and the patient was loaded with Plavix.  He has been relatively pain-free and hemodynamically stable overnight.  Assessment & Plan    1: Inferoposterior STEMI- postop day 1 inferoposterior STEMI treated with PCI and drug-eluting stenting along with aspiration thrombectomy of an occluded dominant proximal RCA with excellent intragraft result.  He had residual downstream disease trifurcation which is not intervened on.  He also had an occluded nondominant circumflex in the mid AV groove, moderate proximal segmental ramus branch stenosis and a moderate size vessel as well as a moderately severe diagonal branch stenosis with preserved LV function.  He is on low-dose aspirin and Plavix as well as IV heparin and Aggrastat for large thrombus burden.  The Aggrastat was ordered for 24 hours which will terminate tomorrow morning.  Heparin will continue in addition to low-dose aspirin Plavix.  Patient will be scheduled for staged distal RCA intervention.  2: Hyperlipidemia- on high-dose statin therapy  3: Factor V Leiden deficiency- on Xarelto of 10 mg a day previously for prophylaxis because of multiple thrombotic episodes in the past.  After speaking with Dr. Myna HidalgoEnnever, his hematologist, it was decided ultimately to put him on 20 mg of Xarelto at discharge.  Dr. Myna HidalgoEnnever will see him tomorrow morning.  He will be placed on low-dose aspirin and Plavix along with Xarelto for 1 month after which aspirin will be discontinued.  For questions or updates, please contact [facility] Please consult [email or web address] for contact info under        Signed, Nanetta BattyJonathan Aneliese Beaudry, MD  03/26/2019, 8:47 AM

## 2019-03-26 NOTE — Progress Notes (Signed)
ANTICOAGULATION CONSULT NOTE - Initial Consult  Pharmacy Consult for heparin and tirofiban Indication: factor V leiden deficiency, h/o recurrent DVTs, and STEMI  No Known Allergies  Patient Measurements: Height: 5\' 11"  (180.3 cm) Weight: 235 lb (106.6 kg) IBW/kg (Calculated) : 75.3 Heparin Dosing Weight: 100kg  Vital Signs: BP: 134/75 (06/24 2240) Pulse Rate: 78 (06/24 2240)  Labs: Recent Labs    03/25/19 2217  HGB 16.0  HCT 47.6  PLT 236  APTT 25  LABPROT 13.3  INR 1.0  CREATININE 1.18    Estimated Creatinine Clearance: 99.2 mL/min (by C-G formula based on SCr of 1.18 mg/dL).   Medical History: Past Medical History:  Diagnosis Date  . DVT (deep venous thrombosis) ([place])   . DVT of proximal leg (deep vein thrombosis) ([place]) 09/07/2011  . Factor V Leiden mutation ([place]) 09/07/2011  . Hyperlipidemia   . Thyroid disease     Medications:  Medications Prior to Admission  Medication Sig Dispense Refill Last Dose  . atorvastatin (LIPITOR) 40 MG tablet Take 40 mg by mouth daily.   03/24/2019 at 2200  . cetirizine (ZYRTEC) 10 MG tablet Take 10 mg by mouth daily.   03/24/2019 at 2200  . levothyroxine (SYNTHROID) 100 MCG tablet Take 100 mcg by mouth daily before breakfast.   03/24/2019 at 2200  . XARELTO 20 MG TABS tablet TAKE 1 TABLET (20 MG TOTAL) BY MOUTH DAILY. (Patient taking differently: Take 10 mg by mouth daily. ) 30 tablet 10 03/24/2019 at 2200   Scheduled:  . aspirin  81 mg Oral Daily  . atorvastatin  80 mg Oral q1800  . clopidogrel  600 mg Oral Once  . sodium chloride flush  3 mL Intravenous Q12H   Infusions:  . sodium chloride 20 mL/hr (03/25/19 2216)  . sodium chloride    . sodium chloride    . heparin    . norepinephrine (LEVOPHED) Adult infusion    . tirofiban 0.15 mcg/kg/min (03/26/19 0125)    Assessment: 43yo male presents as code STEMI, now post-cath where he had stent placed, to begin heparin and tirofiban.  Of note pt is on Xarelto for factor V  leiden deficiency and h/o recurrent DVTs, last dose 6/23 pm.  Goal of Therapy:  Heparin level 0.3-0.5 units/ml until tirofiban off, then 0.3-0.7 units/ml aPTT 66-85 seconds until tirofiban off, then 66-102 seconds Monitor platelets by anticoagulation protocol: Yes   Plan:  Will continue tirofiban 0.15 mcg/kg/min x24hr and monitor CBC.  Will start heparin gtt 8hr after sheath removal (0043 per procedure log) at 1000 units/hr and monitor heparin levels, aPTT (while Xarelto affects anti-Xa), and CBC.  Wynona Neat, PharmD, BCPS  03/26/2019,2:02 AM

## 2019-03-26 NOTE — ED Provider Notes (Signed)
[facility] Provider Note   CSN: [id number] Arrival date & time: 03/25/19  2204     History   Chief Complaint Chief Complaint  Patient presents with  . Code STEMI    HPI Randy Cannon is a 43 y.o. male.     Patient with acute onset of chest pain about an hour prior patient was mountain biking.  Got substernal chest pain that radiated to his back area.  Is subsiding now.  Patient has a history of factor V Leyden mutation and has had deep vein thrombosis.  Has an IVC.  And patient is on Xarelto.  Patient had a dose of Xarelto yesterday did not have any today.  Code STEMI was called by the RN side emergency physician and then patient was moved over her to be pod.  EKG showed ST segment elevation inferiorly and some concerns for anterior posterior type MI or inferior MI pattern.  Consistent with a STEMI.  Discussed with Dr. Tresa EndoKelly.  Patient will be taken to Cath Lab.  It was associated with some lightheadedness and some dizziness.  Patient with no known history of coronary artery disease.  Patient without a history of diabetes no history of hypertension.  Patient denies any history of fevers or upper respiratory infection symptoms.  In addition patient was given heparin bolus prior to me seeing him.     Past Medical History:  Diagnosis Date  . DVT (deep venous thrombosis) (HCC)   . DVT of proximal leg (deep vein thrombosis) (HCC) 09/07/2011  . Factor V Leiden mutation (HCC) 09/07/2011  . Hyperlipidemia   . Thyroid disease     Patient Active Problem List   Diagnosis Date Noted  . DVT of proximal leg (deep vein thrombosis) (HCC) 09/07/2011  . Factor V Leiden mutation (HCC) 09/07/2011    History reviewed. No pertinent surgical history.      Home Medications    Prior to Admission medications   Medication Sig Start Date End Date Taking? Authorizing Provider  atorvastatin (LIPITOR) 40 MG tablet Take 40 mg by mouth daily.   Yes [provider]  cetirizine (ZYRTEC) 10 MG tablet Take 10 mg by mouth daily.   Yes [provider]  levothyroxine (SYNTHROID) 100 MCG tablet Take 100 mcg by mouth daily before breakfast.   Yes [provider]  XARELTO 20 MG TABS tablet TAKE 1 TABLET (20 MG TOTAL) BY MOUTH DAILY. Patient taking differently: Take 10 mg by mouth daily.  01/01/17  Yes Josph MachoEnnever, Peter R, MD    Family History No family history on file.  Social History Social History   Tobacco Use  . Smoking status: Never Smoker  . Smokeless tobacco: Current User  Substance Use Topics  . Alcohol use: Yes  . Drug use: Not Currently     Allergies   Patient has no known allergies.   Review of Systems Review of Systems  Constitutional: Negative for chills and fever.  HENT: Negative for rhinorrhea and sore throat.   Eyes: Negative for visual disturbance.  Respiratory: Negative for cough and shortness of breath.   Cardiovascular: Positive for chest pain. Negative for leg swelling.  Gastrointestinal: Negative for abdominal pain, diarrhea, nausea and vomiting.  Genitourinary: Negative for dysuria.  Musculoskeletal: Negative for back pain and neck pain.  Skin: Negative for rash.  Neurological: Positive for dizziness and light-headedness. Negative for headaches.  Hematological: Does not bruise/bleed easily.  Psychiatric/Behavioral: Negative for confusion.  Physical Exam Updated Vital Signs BP 134/75   Pulse 78   Resp 14   Ht 1.803 m (5\' 11" )   Wt 106.6 kg   SpO2 98%   BMI 32.78 kg/m   Physical Exam Vitals signs and nursing note reviewed.  Constitutional:      Appearance: Normal appearance. He is well-developed.  HENT:     Head: Normocephalic and atraumatic.     Mouth/Throat:     Mouth: Mucous membranes are moist.  Eyes:     Extraocular Movements: Extraocular movements intact.     Conjunctiva/sclera: Conjunctivae normal.     Pupils: Pupils are equal, round, and reactive to light.   Neck:     Musculoskeletal: Normal range of motion and neck supple.  Cardiovascular:     Rate and Rhythm: Normal rate and regular rhythm.     Heart sounds: No murmur.  Pulmonary:     Effort: Pulmonary effort is normal. No respiratory distress.     Breath sounds: Normal breath sounds.  Abdominal:     Palpations: Abdomen is soft.     Tenderness: There is no abdominal tenderness.  Musculoskeletal: Normal range of motion.     Comments: Radial pulse in both upper extremities is 2+.  Skin:    General: Skin is warm and dry.     Capillary Refill: Capillary refill takes less than 2 seconds.  Neurological:     General: No focal deficit present.     Mental Status: He is alert and oriented to person, place, and time.      ED Treatments / Results  Labs (all labs ordered are listed, but only abnormal results are displayed) Labs Reviewed  CBC WITH DIFFERENTIAL/PLATELET - Abnormal; Notable for the following components:      Result Value   WBC 15.6 (*)    Neutro Abs 12.7 (*)    All other components within normal limits  COMPREHENSIVE METABOLIC PANEL - Abnormal; Notable for the following components:   CO2 21 (*)    Glucose, Bld 115 (*)    ALT 51 (*)    All other components within normal limits  TROPONIN I (HIGH SENSITIVITY) - Abnormal; Notable for the following components:   Troponin I (High Sensitivity) 29 (*)    All other components within normal limits  LIPID PANEL - Abnormal; Notable for the following components:   HDL 39 (*)    LDL Cholesterol 106 (*)    All other components within normal limits  SARS CORONAVIRUS 2 (HOSPITAL ORDER, PERFORMED IN  HOSPITAL LAB)  PROTIME-INR  APTT  TROPONIN I (HIGH SENSITIVITY)    EKG EKG Interpretation  Date/Time:  Wednesday March 25 2019 22:11:35 EDT Ventricular Rate:  62 PR Interval:    QRS Duration: 119 QT Interval:  391 QTC Calculation: 397 R Axis:   12 Text Interpretation:  Sinus rhythm Nonspecific intraventricular  conduction delay Inferoposterior infarct, acute (RCA) Abnormal lateral Q waves Probable RV involvement, suggest recording right precordial leads No previous ECGs available Confirmed by Vanetta Mulders [phone]) on 03/25/2019 10:30:19 PM   Radiology Dg Chest Portable 1 View  Result Date: 03/25/2019 CLINICAL DATA:  Code STEMI EXAM: PORTABLE CHEST 1 VIEW COMPARISON:  04/22/2011 FINDINGS: The heart size and mediastinal contours are within normal limits. Both lungs are clear. The visualized skeletal structures are unremarkable. IMPRESSION: No active disease. Electronically Signed   By: Jasmine Pang M.D.   On: 03/25/2019 22:46    Procedures Procedures (including critical care time)  CRITICAL CARE  Performed by: Vanetta MuldersScott Laurance Heide Total critical care time: 30 minutes Critical care time was exclusive of separately billable procedures and treating other patients. Critical care was necessary to treat or prevent imminent or life-threatening deterioration. Critical care was time spent personally by me on the following activities: development of treatment plan with patient and/or surrogate as well as nursing, discussions with consultants, evaluation of patient's response to treatment, examination of patient, obtaining history from patient or surrogate, ordering and performing treatments and interventions, ordering and review of laboratory studies, ordering and review of radiographic studies, pulse oximetry and re-evaluation of patient's condition.   Medications Ordered in ED Medications  0.9 %  sodium chloride infusion (20 mL/hr Intravenous New Bag/Given 03/25/19 2216)  midazolam (VERSED) injection (2 mg Intravenous Given 03/25/19 2300)  fentaNYL (SUBLIMAZE) injection (25 mcg Intravenous Given 03/25/19 2300)  lidocaine (PF) (XYLOCAINE) 1 % injection (5 mLs Subcutaneous Given 03/25/19 2300)  Radial Cocktail/Verapamil only (10 mLs Intra-arterial Given 03/25/19 2309)  heparin injection (8,000 Units Intravenous  Given 03/25/19 2328)  cangrelor [facility](KENGREAL) bolus via infusion (3,198 mcg Intravenous Given 03/25/19 2338)  cangrelor (KENGREAL) 50,000 mcg in sodium chloride 0.9 % 250 mL (200 mcg/mL) infusion (4 mcg/kg/min  106.6 kg Intravenous New Bag/Given 03/25/19 2338)  bivalirudin (ANGIOMAX) BOLUS via infusion (79.95 mg Intravenous Canceled Entry 03/25/19 2344)  tirofiban (AGGRASTAT) infusion 50 mcg/mL 100 mL (0.15 mcg/kg/min  106.6 kg Intravenous New Bag/Given 03/25/19 2350)  tirofiban (AGGRASTAT) bolus via infusion (2,665 mcg Intravenous Given 03/25/19 2345)  atropine 1 MG/10ML injection (0.5 mg Intravenous Given 03/25/19 2351)  0.9 %  sodium chloride infusion (200 mL/hr Intravenous Rate/Dose Change 03/26/19 0029)  norepinephrine (LEVOPHED) 4 mg in dextrose 5 % 250 mL (0.016 mg/mL) infusion (5 mcg/min Intravenous Rate/Dose Change 03/26/19 0009)  nitroGLYCERIN 1 mg/10 mL (100 mcg/mL) - IR/CATH LAB (200 mcg Intracoronary Given 03/26/19 0028)  iohexol (OMNIPAQUE) 350 MG/ML injection (175 mLs Intra-arterial Given 03/26/19 0040)  heparin injection 4,000 Units (4,000 Units Intravenous Given 03/25/19 2214)  lidocaine (PF) (XYLOCAINE) 1 % injection (has no administration in time range)  Heparin (Porcine) in NaCl 1000-0.9 UT/500ML-% SOLN (has no administration in time range)  verapamil (ISOPTIN) 2.5 MG/ML injection (has no administration in time range)  midazolam (VERSED) 2 MG/2ML injection (has no administration in time range)  fentaNYL (SUBLIMAZE) 100 MCG/2ML injection (has no administration in time range)  heparin 1000 UNIT/ML injection (has no administration in time range)  cangrelor (KENGREAL) 50 MG SOLR (has no administration in time range)  tirofiban (AGGRASTAT) 5-0.9 MG/100ML-% injection (has no administration in time range)  norepinephrine (LEVOPHED) 4-5 MG/250ML-% infusion SOLN (has no administration in time range)  nitroGLYCERIN 100 mcg/mL intra-arterial injection (has no administration in time range)      Initial Impression / Assessment and Plan / ED Course  I have reviewed the triage vital signs and the nursing notes.  Pertinent labs & imaging results that were available during my care of the patient were reviewed by me and considered in my medical decision making (see chart for details).       EKG findings and history consistent with acute myocardial infarction.  STEMI in nature.  Dr. Tresa EndoKelly will take patient to Cath Lab.  Patient did receive heparin prior to seeing him even though he is on Xarelto.  Last dose Xarelto was last night.  Does have good radial pulses.  Chest x-ray was done due to the fact the pain went to the back area from the anterior chest had no acute findings.  Patient's initial troponin was elevated at 29.   Final Clinical Impressions(s) / ED Diagnoses   Final diagnoses:  ST elevation myocardial infarction (STEMI), unspecified artery [facility])    ED Discharge Orders    None       Fredia Sorrow, MD 03/26/19 [id number]

## 2019-03-27 ENCOUNTER — Encounter (HOSPITAL_COMMUNITY)
Admission: EM | Disposition: A | Discharge status: Home / Self Care | Payer: Self-pay | Source: Home / Self Care | Attending: Cardiovascular Disease

## 2019-03-27 ENCOUNTER — Encounter (HOSPITAL_COMMUNITY): Payer: Self-pay | Admitting: *Deleted

## 2019-03-27 DIAGNOSIS — I25110 Atherosclerotic heart disease of native coronary artery with unstable angina pectoris: Secondary | ICD-10-CM

## 2019-03-27 DIAGNOSIS — D6851 Activated protein C resistance: Secondary | ICD-10-CM

## 2019-03-27 DIAGNOSIS — I214 Non-ST elevation (NSTEMI) myocardial infarction: Secondary | ICD-10-CM

## 2019-03-27 HISTORY — PX: CORONARY STENT INTERVENTION: CATH118234

## 2019-03-27 LAB — POCT ACTIVATED CLOTTING TIME
Activated Clotting Time: 340 seconds
Activated Clotting Time: 213 seconds
Activated Clotting Time: 483 seconds
Activated Clotting Time: 241 seconds
Activated Clotting Time: 252 seconds
Activated Clotting Time: 279 seconds

## 2019-03-27 LAB — SURGICAL PCR SCREEN
MRSA, PCR: NEGATIVE
Staphylococcus aureus: NEGATIVE

## 2019-03-27 LAB — HEPARIN LEVEL (UNFRACTIONATED)
Heparin Unfractionated: 0.22 IU/mL — ABNORMAL LOW (ref 0.30–0.70)
Heparin Unfractionated: 0.24 IU/mL — ABNORMAL LOW (ref 0.30–0.70)

## 2019-03-27 SURGERY — CORONARY STENT INTERVENTION
Anesthesia: LOCAL

## 2019-03-27 MED ORDER — SODIUM CHLORIDE 0.9 % IV SOLN: INTRAVENOUS | Status: DC

## 2019-03-27 MED ORDER — FENTANYL CITRATE (PF) 100 MCG/2ML IJ SOLN
INTRAMUSCULAR | Status: DC | PRN
  Administered 2019-03-27: 25 ug via INTRAVENOUS

## 2019-03-27 MED ORDER — ASPIRIN 81 MG PO CHEW
81.0000 mg | CHEWABLE_TABLET | Freq: Every day | ORAL | Status: DC
  Administered 2019-03-28: 81 mg via ORAL
  Filled 2019-03-27: qty 1

## 2019-03-27 MED ORDER — SODIUM CHLORIDE 0.9% FLUSH: 3.0000 mL | INTRAVENOUS | Status: DC | PRN

## 2019-03-27 MED ORDER — LIDOCAINE HCL (PF) 1 % IJ SOLN
INTRAMUSCULAR | Status: DC | PRN
  Administered 2019-03-27: 16 mL

## 2019-03-27 MED ORDER — MUPIROCIN 2 % EX OINT
1.0000 "application " | TOPICAL_OINTMENT | Freq: Two times a day (BID) | CUTANEOUS | Status: DC
  Administered 2019-03-27: 1 via NASAL
  Filled 2019-03-27: qty 22

## 2019-03-27 MED ORDER — IOHEXOL 350 MG/ML SOLN
INTRAVENOUS | Status: DC | PRN
  Administered 2019-03-27: 18:00:00 290 mL via INTRA_ARTERIAL

## 2019-03-27 MED ORDER — CLOPIDOGREL BISULFATE 75 MG PO TABS
75.0000 mg | ORAL_TABLET | Freq: Every day | ORAL | Status: DC
  Administered 2019-03-28: 75 mg via ORAL
  Filled 2019-03-27: qty 1

## 2019-03-27 MED ORDER — ONDANSETRON HCL 4 MG/2ML IJ SOLN: 4.0000 mg | Freq: Four times a day (QID) | INTRAMUSCULAR | Status: DC | PRN

## 2019-03-27 MED ORDER — FENTANYL CITRATE (PF) 100 MCG/2ML IJ SOLN
INTRAMUSCULAR | Status: AC
  Filled 2019-03-27: qty 2

## 2019-03-27 MED ORDER — ATORVASTATIN CALCIUM 80 MG PO TABS: 80.0000 mg | ORAL_TABLET | Freq: Every day | ORAL | Status: DC

## 2019-03-27 MED ORDER — MIDAZOLAM HCL 2 MG/2ML IJ SOLN
INTRAMUSCULAR | Status: AC
  Filled 2019-03-27: qty 2

## 2019-03-27 MED ORDER — HEPARIN (PORCINE) IN NACL 1000-0.9 UT/500ML-% IV SOLN
INTRAVENOUS | Status: DC | PRN
  Administered 2019-03-27 (×2): 500 mL

## 2019-03-27 MED ORDER — SODIUM CHLORIDE 0.9% FLUSH
3.0000 mL | Freq: Two times a day (BID) | INTRAVENOUS | Status: DC
  Administered 2019-03-27: 20:00:00 3 mL via INTRAVENOUS

## 2019-03-27 MED ORDER — HEPARIN SODIUM (PORCINE) 1000 UNIT/ML IJ SOLN
INTRAMUSCULAR | Status: DC | PRN
  Administered 2019-03-27: 3000 [IU] via INTRAVENOUS
  Administered 2019-03-27: 2000 [IU] via INTRAVENOUS
  Administered 2019-03-27: 8000 [IU] via INTRAVENOUS
  Administered 2019-03-27: 4000 [IU] via INTRAVENOUS

## 2019-03-27 MED ORDER — LIDOCAINE HCL (PF) 1 % IJ SOLN
INTRAMUSCULAR | Status: AC
  Filled 2019-03-27: qty 30

## 2019-03-27 MED ORDER — SODIUM CHLORIDE 0.9 % IV SOLN: 250.0000 mL | INTRAVENOUS | Status: DC | PRN

## 2019-03-27 MED ORDER — LABETALOL HCL 5 MG/ML IV SOLN: 10.0000 mg | INTRAVENOUS | Status: AC | PRN

## 2019-03-27 MED ORDER — HEPARIN (PORCINE) 25000 UT/250ML-% IV SOLN: 1550.0000 [IU]/h | INTRAVENOUS | Status: DC

## 2019-03-27 MED ORDER — HEPARIN SODIUM (PORCINE) 1000 UNIT/ML IJ SOLN
INTRAMUSCULAR | Status: AC
  Filled 2019-03-27: qty 1

## 2019-03-27 MED ORDER — DIAZEPAM 5 MG PO TABS: 5.0000 mg | ORAL_TABLET | ORAL | Status: DC | PRN

## 2019-03-27 MED ORDER — HYDRALAZINE HCL 20 MG/ML IJ SOLN: 10.0000 mg | INTRAMUSCULAR | Status: AC | PRN

## 2019-03-27 MED ORDER — MIDAZOLAM HCL 2 MG/2ML IJ SOLN
INTRAMUSCULAR | Status: DC | PRN
  Administered 2019-03-27: 1 mg via INTRAVENOUS
  Administered 2019-03-27: 2 mg via INTRAVENOUS

## 2019-03-27 MED ORDER — ACETAMINOPHEN 325 MG PO TABS: 650.0000 mg | ORAL_TABLET | ORAL | Status: DC | PRN

## 2019-03-27 MED ORDER — HEPARIN (PORCINE) IN NACL 1000-0.9 UT/500ML-% IV SOLN
INTRAVENOUS | Status: AC
  Filled 2019-03-27: qty 1000

## 2019-03-27 MED ORDER — RIVAROXABAN 20 MG PO TABS: 20.0000 mg | ORAL_TABLET | Freq: Every day | ORAL | Status: DC

## 2019-03-27 SURGICAL SUPPLY — 27 items
BAG SNAP BAND KOVER 36X36 (MISCELLANEOUS) ×1 IMPLANT
BALLN SAPPHIRE 2.0X10 (BALLOONS) ×2
BALLN SAPPHIRE ~~LOC~~ 2.75X10 (BALLOONS) ×1 IMPLANT
BALLN SAPPHIRE ~~LOC~~ 3.0X10 (BALLOONS) ×1 IMPLANT
BALLN WOLVERINE 2.50X10 (BALLOONS) ×2
BALLN WOLVERINE 2.75X10 (BALLOONS) ×2
BALLN ~~LOC~~ SAPPHIRE 4.5X10 (BALLOONS) ×2
BALLOON SAPPHIRE 2.0X10 (BALLOONS) IMPLANT
BALLOON WOLVERINE 2.50X10 (BALLOONS) IMPLANT
BALLOON WOLVERINE 2.75X10 (BALLOONS) IMPLANT
BALLOON ~~LOC~~ SAPPHIRE 4.5X10 (BALLOONS) IMPLANT
CATH VISTA GUIDE 6FR JR4 (CATHETERS) ×1 IMPLANT
CATH VISTA GUIDE 6FR XBLAD3.5 (CATHETERS) ×1 IMPLANT
DEVICE CONTINUOUS FLUSH (MISCELLANEOUS) ×1 IMPLANT
ELECT DEFIB PAD ADLT CADENCE (PAD) ×1 IMPLANT
KIT ENCORE 26 ADVANTAGE (KITS) ×1 IMPLANT
KIT HEART LEFT (KITS) ×2 IMPLANT
PACK CARDIAC CATHETERIZATION (CUSTOM PROCEDURE TRAY) ×2 IMPLANT
SHEATH PINNACLE 6F 10CM (SHEATH) ×1 IMPLANT
SHEATH PROBE COVER 6X72 (BAG) ×1 IMPLANT
STENT RESOLUTE ONYX 2.5X15 (Permanent Stent) ×1 IMPLANT
STENT RESOLUTE ONYX 3.0X12 (Permanent Stent) ×1 IMPLANT
TRANSDUCER W/STOPCOCK (MISCELLANEOUS) ×2 IMPLANT
TUBING CIL FLEX 10 FLL-RA (TUBING) ×2 IMPLANT
WIRE ASAHI PROWATER 180CM (WIRE) ×1 IMPLANT
WIRE COUGAR XT STRL 190CM (WIRE) ×1 IMPLANT
WIRE EMERALD 3MM-J .035X150CM (WIRE) ×1 IMPLANT

## 2019-03-27 NOTE — Interval H&P Note (Signed)
Cath Lab Visit (complete for each Cath Lab visit)  Clinical Evaluation Leading to the Procedure:   ACS: Yes.    Non-ACS:    Anginal Classification: CCS III  Anti-ischemic medical therapy: Maximal Therapy (2 or more classes of medications)  Non-Invasive Test Results: No non-invasive testing performed  Prior CABG: No previous CABG      History and Physical Interval Note:  03/27/2019 3:42 PM  Randy Cannon  has presented today for surgery, with the diagnosis of unstable angina.  The various methods of treatment have been discussed with the patient and family. After consideration of risks, benefits and other options for treatment, the patient has consented to  Procedure(s): CORONARY STENT INTERVENTION (N/A) as a surgical intervention.  The patient's history has been reviewed, patient examined, no change in status, stable for surgery.  I have reviewed the patient's chart and labs.  Questions were answered to the patient's satisfaction.     Shelva Majestic

## 2019-03-27 NOTE — Progress Notes (Signed)
ANTICOAGULATION CONSULT NOTE   Pharmacy Consult for heparin and tirofiban Indication: factor V leiden deficiency, h/o recurrent DVTs, and STEMI  No Known Allergies  Patient Measurements: Height: 5\' 11"  (180.3 cm) Weight: 233 lb 11 oz (106 kg) IBW/kg (Calculated) : 75.3 Heparin Dosing Weight: 100kg  Vital Signs: Temp: 98.6 F (37 C) (06/26 0011) Temp Source: Oral (06/26 0011) BP: 99/75 (06/26 0100)  Labs: Recent Labs    03/25/19 2217  03/25/19 2313 03/26/19 0307 03/26/19 1548 03/26/19 2033 03/27/19 0019  HGB 16.0   < >  --  14.6 14.1 13.9  --   HCT 47.6   < >  --  42.9 41.8 41.2  --   PLT 236  --   --  231 218 226  --   APTT 25  --   --   --  40*  --   --   LABPROT 13.3  --   --   --   --   --   --   INR 1.0  --   --   --   --   --   --   HEPARINUNFRC  --   --   --  0.19* <0.10*  --  0.22*  CREATININE 1.18  --  0.80 0.88  --  0.97  --    < > = values in this interval not displayed.    Estimated Creatinine Clearance: 120.4 mL/min (by C-G formula based on SCr of 0.97 mg/dL).   Medical History: Past Medical History:  Diagnosis Date  . DVT (deep venous thrombosis) ([place])   . DVT of proximal leg (deep vein thrombosis) ([place]) 09/07/2011  . Factor V Leiden mutation ([place]) 09/07/2011  . Hyperlipidemia   . Thyroid disease     Medications:  Medications Prior to Admission  Medication Sig Dispense Refill Last Dose  . acetaminophen (TYLENOL) 325 MG tablet Take 325-650 mg by mouth every 8 (eight) hours as needed for mild pain, moderate pain or headache.    Past Week at Unknown time  . atorvastatin (LIPITOR) 40 MG tablet Take 40 mg by mouth at bedtime.    03/24/2019 at pm  . cetirizine (ZYRTEC) 10 MG tablet Take 10 mg by mouth daily as needed (for seasonal allergies).    03/25/2019 at am  . levothyroxine (SYNTHROID) 100 MCG tablet Take 100 mcg by mouth at bedtime.    03/24/2019 at pm  . XARELTO 10 MG TABS tablet Take 10 mg by mouth at bedtime.    03/24/2019 at 2200  . XARELTO 20  MG TABS tablet TAKE 1 TABLET (20 MG TOTAL) BY MOUTH DAILY. (Patient not taking: Reported on 03/26/2019) 30 tablet 10 Not Taking at Unknown time   Scheduled:  . aspirin  81 mg Oral Pre-Cath  . [START ON 03/28/2019] aspirin EC  81 mg Oral Daily  . atorvastatin  80 mg Oral q1800  . Chlorhexidine Gluconate Cloth  6 each Topical Daily  . clopidogrel  75 mg Oral Pre-Cath  . [START ON 03/28/2019] clopidogrel  75 mg Oral Daily  . levothyroxine  100 mcg Oral Q0600  . sodium chloride flush  3 mL Intravenous Q12H  . sodium chloride flush  3 mL Intravenous Q12H   Infusions:  . sodium chloride 20 mL/hr (03/25/19 2216)  . sodium chloride Stopped (03/26/19 0722)  . sodium chloride    . sodium chloride    . sodium chloride 50 mL/hr at 03/27/19 0000  . heparin 1,200 Units/hr (03/27/19 0000)  .  norepinephrine (LEVOPHED) Adult infusion    . tirofiban 0.15 mcg/kg/min (03/27/19 0000)    Assessment: 43yo male presents as code STEMI, now post-cath where he had stent placed, to begin heparin and tirofiban.  Of note pt is on Xarelto for factor V leiden deficiency and h/o recurrent DVTs, last dose 6/23 pm.  Heparin level 0.22 units/ml  Goal of Therapy:  Heparin level 0.3-0.5 units/ml until tirofiban off, then 0.3-0.7 units/ml aPTT 66-85 seconds until tirofiban off, then 66-102 seconds Monitor platelets by anticoagulation protocol: Yes   Plan:  Increase IV heparin to 1400 units/hr. Daily heparin level and CBC.  Thanks for allowing pharmacy to be a part of this patient's care.  Talbert Cage, PharmD Clinical Pharmacist   03/27/2019 1:15 AM

## 2019-03-27 NOTE — Progress Notes (Signed)
Progress Note  Patient Name: Randy Cannon Date of Encounter: 03/27/2019  Primary Cardiologist: Dr. Ellouise Newer  Subjective   Postop day #2 inferoposterior STEMI with RV involvement status post aspiration thrombectomy, PCI and drug-eluting stenting using a resolute Onyx drug-eluting stent by Dr. Claiborne Billings via the right radial approach.  Patient is pain-free on IV heparin .  Plan staged distal RCA trifurcation PCI and drug-eluting stenting plus or minus diagonal branch intervention.  Inpatient Medications    Scheduled Meds:  [START ON 03/28/2019] aspirin EC  81 mg Oral Daily   atorvastatin  80 mg Oral q1800   Chlorhexidine Gluconate Cloth  6 each Topical Daily   [START ON 03/28/2019] clopidogrel  75 mg Oral Daily   levothyroxine  100 mcg Oral Q0600   sodium chloride flush  3 mL Intravenous Q12H   sodium chloride flush  3 mL Intravenous Q12H   Continuous Infusions:  sodium chloride 20 mL/hr (03/25/19 2216)   sodium chloride Stopped (03/26/19 0722)   sodium chloride     sodium chloride     sodium chloride 50 mL/hr at 03/27/19 0600   heparin 1,550 Units/hr (03/27/19 0856)   norepinephrine (LEVOPHED) Adult infusion     PRN Meds: sodium chloride, sodium chloride, acetaminophen, diazepam, ondansetron (ZOFRAN) IV, sodium chloride flush, sodium chloride flush, zolpidem   Vital Signs    Vitals:   03/27/19 0600 03/27/19 0700 03/27/19 0800 03/27/19 0817  BP: 99/76 101/78 103/74   Pulse:      Resp:      Temp:    98.7 F (37.1 C)  TempSrc:    Oral  SpO2: 96% 96% 93%   Weight:      Height:        Intake/Output Summary (Last 24 hours) at 03/27/2019 0929 Last data filed at 03/27/2019 0700 Gross per 24 hour  Intake 1022.73 ml  Output 1600 ml  Net -577.27 ml   Last 3 Weights 03/26/2019 03/26/2019 03/25/2019  Weight (lbs) 233 lb 11 oz 234 lb 2.1 oz 235 lb  Weight (kg) 106 kg 106.2 kg 106.595 kg      Telemetry    Sinus rhythm- Personally Reviewed  ECG    Normal  sinus rhythm at 68 with inferior Q waves, inferolateral T wave inversion in early R wave transition consistent with an inferoposterior myocardial infarction.- Personally Reviewed  Physical Exam   GEN: No acute distress.   Neck: No JVD Cardiac: RRR, no murmurs, rubs, or gallops.  Respiratory: Clear to auscultation bilaterally. GI: Soft, nontender, non-distended  MS: No edema; No deformity. Neuro:  Nonfocal  Psych: Normal affect  Extremities: His right forearm is wrapped potentially from the table bleeding from his anticoagulation.  He does have a 2+ right radial pulse.  Labs    High Sensitivity Troponin:   Recent Labs  Lab 03/25/19 2217 03/26/19 0307  TROPONINIHS 29* 8,288*      Cardiac EnzymesNo results for input(s): TROPONINI in the last 168 hours. No results for input(s): TROPIPOC in the last 168 hours.   Chemistry Recent Labs  Lab 03/25/19 2217 03/25/19 2312 03/25/19 2313 03/26/19 0307 03/26/19 2033  NA 141 141  --  139 137  K 4.3 4.1  --  4.0 4.0  CL 108  --   --  110 106  CO2 21*  --   --  21* 23  GLUCOSE 115*  --   --  125* 121*  BUN 19  --   --  14 13  CREATININE 1.18  --  0.80 0.88 0.97  CALCIUM 9.9  --   --  8.9 9.2  PROT 7.9  --   --   --   --   ALBUMIN 4.9  --   --   --   --   AST 29  --   --   --   --   ALT 51*  --   --   --   --   ALKPHOS 64  --   --   --   --   BILITOT 0.9  --   --   --   --   GFRNONAA >60  --   --  >60 >60  GFRAA >60  --   --  >60 >60  ANIONGAP 12  --   --  8 8     Hematology Recent Labs  Lab 03/26/19 0307 03/26/19 1548 03/26/19 2033  WBC 12.7* 11.8* 10.5  RBC 4.97 4.81 4.69  HGB 14.6 14.1 13.9  HCT 42.9 41.8 41.2  MCV 86.3 86.9 87.8  MCH 29.4 29.3 29.6  MCHC 34.0 33.7 33.7  RDW 12.5 12.8 13.0  PLT 231 218 226    BNPNo results for input(s): BNP, PROBNP in the last 168 hours.   DDimer No results for input(s): DDIMER in the last 168 hours.   Radiology    Dg Chest Portable 1 View  Result Date:  03/25/2019 CLINICAL DATA:  Code STEMI EXAM: PORTABLE CHEST 1 VIEW COMPARISON:  04/22/2011 FINDINGS: The heart size and mediastinal contours are within normal limits. Both lungs are clear. The visualized skeletal structures are unremarkable. IMPRESSION: No active disease. Electronically Signed   By: Jasmine Pang M.D.   On: 03/25/2019 22:46    Cardiac Studies   Cardiac catheterization/PCI and stent (03/25/2019)  Conclusion    1st RPL lesion is 80% stenosed.  RPDA lesion is 80% stenosed.  Dist RCA lesion is 80% stenosed.  Prox RCA lesion is 100% stenosed.  Ramus lesion is 70% stenosed.  1st Diag lesion is 90% stenosed.  Mid Cx to Dist Cx lesion is 100% stenosed.  There is mild left ventricular systolic dysfunction.  LV end diastolic pressure is normal.  The left ventricular ejection fraction is 50-55% by visual estimate.  3rd RPL-1 lesion is 60% stenosed.  3rd RPL-2 lesion is 100% stenosed.  A drug-eluting stent was successfully placed.  Post intervention, there is a 0% residual stenosis.   Acute inferior ST segment elevation myocardial infarction secondary to total occlusion of a very large dominant RCA proximally with extensive thrombus burden.  Multivessel CAD with percent ostial tapering of a long left main; 90% proximal stenosis in a very proximal diagonal branch of the LAD with a normal LAD extending to the apex; 70% proximal stenosis in the ramus intermediate vessel; total occlusion of the mid left circumflex coronary artery with retrograde filling of the distal obtuse marginal branch from LAD collaterals; and total proximal occlusion of a very large dominant RCA with extensive thrombus burden there is some faint collateralization to the PDA a and PLA vessels from the left coronary circulation.  Difficult but successful PCI to the large RCA requiring PTCA, extensive thrombectomy, Aggrastat with reperfusion induced hypotension and bradycardia necessitating significant  fluid resuscitation, atropine and Levophed with ultimate insertion of a 4.0 x 22 mm Resolute Onyx DES stent postdilated to 4.4 mm with the 100% occlusion being reduced to 0% and resumption of brisk TIMI-3 flow.  There is a very complex  80% trifurcation stenosis in the distal RCA at the takeoff of the PDA large inferior LV branch and continuation branch with thrombus in the mid PLA just collateralized from the left circulation.    Mild acute LV dysfunction with inferobasal hypocontractility.  RECOMMENDATION: The patient left the catheterization laboratory on Cangrelor which will be discontinued in the CCU with subsequent administration of Plavix 600 mg daily.  Patient will be maintained on Aggrastat for at least 24 hours and possibly until Friday a.m. prior to subsequent planned intervention to the complex trifurcation distal LAD stenosis and possibly the diagonal and ramus vessels.  Will ask colleagues to review. At present Xarelto will be held but ultimately he will need to be on triple drug therapy with aspirin/Plavix and Xarelto for 1 month and then Plavix/Xarelto.  Plan echo Doppler in a.m.  Titrate to maximum statin dose.  With the patient's history of significant hyperlipidemia at a young age consider possible familial hyperlipidemia and if LDL cannot reach target would initiate PCSK9 inhibition.     2D echocardiogram (03/26/2019)  IMPRESSIONS    1. The left ventricle has low normal systolic function, with an ejection fraction of 50-55%. The cavity size was normal. Left ventricular diastolic parameters were normal.  2. Inferior hypokinesis, particularly in the basal segment.  3. The right ventricle has moderately reduced systolic function. The cavity was normal. There is no increase in right ventricular wall thickness.  4. The mitral valve is abnormal. Mild thickening of the mitral valve leaflet. There is mild mitral annular calcification present.  5. The tricuspid valve is grossly  normal.  6. The aortic valve is tricuspid. Mild thickening of the aortic valve.  7. The inferior vena cava was dilated in size with <50% respiratory variability.  8. The interatrial septum was not assessed.    Patient Profile     43 y.o. mildly overweight married Caucasian male father of 681 45-year-old child with a prior history of factor V Leiden deficiency with multiple thrombotic episodes and including iliac stenting and IVC filter implantation.  He has been on Xarelto 10 mg a day for prophylaxis.  He has no other cardiac risk factors other than hyperlipidemia.  He was not biking developed chest pain.  EMS was called and they evacuated him via boat.  He was brought emergently to Cath Lab underwent radial diagnostic cath and intervention by Dr. Tresa EndoKelly with an occluded dominant RCA, occluded nondominant circumflex in the midportion, moderate disease in the proximal large ramus branch and diagonal branch disease.  His EF was preserved with inferobasal hypokinesia.  He underwent aspiration thrombectomy of a large thrombus burden, PCI and drug-eluting stenting of the proximal RCA using resolute Onyx drug-eluting stent.  He had restoration of antegrade flow with hypotension suggesting RV involvement requiring fluid resuscitation and treatment with atropine and pressors.  Ultimately he hemodynamically stabilized.  He had trifurcation lesion at his distal RCA and PDA/PLA which was not intervened on requiring staged intervention.  He was placed on Cangrelor in the Cath Lab as well as heparin and Aggrastat.  The cangrelor was discontinued and the patient was loaded with Plavix.  He remained stable over the last 24 hours on IV heparin with plans to perform staged distal RCA and diagonal branch intervention by Dr. Tresa EndoKelly today.  Assessment & Plan    1: Inferoposterior STEMI- postop day 1 inferoposterior STEMI treated with PCI and drug-eluting stenting along with aspiration thrombectomy of an occluded dominant  proximal RCA with excellent intragraft result.  He had residual downstream disease trifurcation which is not intervened on.  He also had an occluded nondominant circumflex in the mid AV groove, moderate proximal segmental ramus branch stenosis and a moderate size vessel as well as a moderately severe diagonal branch stenosis with preserved LV function.  He is on low-dose aspirin and Plavix as well as IV heparin and Aggrastat for large thrombus burden.  The Aggrastat was ordered for 24 hours .  He has been maintained on IV heparin is remained stable.  He is scheduled for staged distal RCA trifurcation PCI and stenting in addition to diagonal branch intervention.  2: Hyperlipidemia- on high-dose statin therapy  3: Factor V Leiden deficiency- on Xarelto of 10 mg a day previously for prophylaxis because of multiple thrombotic episodes in the past.  After speaking with Dr. Myna HidalgoEnnever, his hematologist, it was decided ultimately to put him on 20 mg of Xarelto at discharge.  Dr. Myna HidalgoEnnever saw him this morning.  Plans will be to increase his Xarelto from 10 to 20 mg a day as a maintenance dose.  He will be placed on low-dose aspirin and Plavix along with Xarelto for 1 month after which aspirin will be discontinued.  For questions or updates, please contact [facility] Please consult [email or web address] for contact info under        Signed, Nanetta BattyJonathan Jennylee Uehara, MD  03/27/2019, 9:29 AM

## 2019-03-27 NOTE — Progress Notes (Signed)
[id number] Checked with pt to see if any questions re ed done yesterday. Answered questions re getting back to exercise.  Pt stated he has been looking over materials. Encouraged pt to walk with evening with staff after procedure done and bedrest is up. Will continue to follow. Graylon Good RN BSN 03/27/2019 8:39 AM

## 2019-03-27 NOTE — Consult Note (Addendum)
Referral MD  Reason for Referral: Myocardial infarction-right coronary artery blockage; factor V Leiden-heterozygous  Chief Complaint  Patient presents with  . Code STEMI  : I had a heart attack while mountain biking.  HPI: Randy Cannon is well-known to me.  I have not seen him for about 6 years.  It is nice to see him again.  This is an incredible story that he has.  He apparently was mountain biking with friends on Wednesday.  He was doing well and then all of a sudden began to have symptoms with respect to nausea, diaphoresis, and fatigue.  He did not have any syncope.  He had to be removed from the mountain bike trail by EMS.  I had absolutely no idea that EMS had all of these resources at their disposal.  He apparently was "boated" out and sent to the emergency room.  He was found to have a STEMI with right ventricular involvement.  He underwent cardiac cath.  He had a thrombus aspirated out.  He had a stent placed.  He is on IV heparin and argatroban.  He is going for another cardiac cath today.  He has been on maintenance dose of Xarelto.  He has been on this for 6 years.  He initially presented with significant lower extremity thrombotic disease back in 2012.  He required mechanical thrombectomy at that time.  He was on therapeutic Xarelto.  After a couple years, we then placed him on maintenance Xarelto.  He does have cholesterol issues.  There is a history of cardiac disease in the family.  It is quite unusual for factor V Leiden to cause myocardial infarctions since this is an arterial event.  However, I have to believe that the factor V Leiden played a role in all of this.  He looks great.  He has a new job.  He is married.  He has a 61-year-old daughter.  I am so happy for him.  He still looks incredibly good.  He is very fit.  He has been working out.  He has had no problems with his legs.  He has had no issues with bleeding.  Currently, I would have to say that his  performance status is probably ECOG 1.     Past Medical History:  Diagnosis Date  . DVT (deep venous thrombosis) ([place])   . DVT of proximal leg (deep vein thrombosis) ([place]) 09/07/2011  . Factor V Leiden mutation ([place]) 09/07/2011  . Hyperlipidemia   . Thyroid disease   :  Past Surgical History:  Procedure Laterality Date  . CORONARY/GRAFT ACUTE MI REVASCULARIZATION N/A 03/25/2019   Procedure: Coronary/Graft Acute MI Revascularization;  Surgeon: Troy Sine, MD;  Location: [facility];  Service: Cardiovascular;  Laterality: N/A;  . LEFT HEART CATH AND CORONARY ANGIOGRAPHY N/A 03/25/2019   Procedure: LEFT HEART CATH AND CORONARY ANGIOGRAPHY;  Surgeon: Troy Sine, MD;  Location: [facility];  Service: Cardiovascular;  Laterality: N/A;  :   Current Facility-Administered Medications:  .  0.9 %  sodium chloride infusion, , Intravenous, Continuous, Fredia Sorrow, MD, Last Rate: 20 mL/hr at 03/25/19 2216, 20 mL/hr at 03/25/19 2216 .  0.9 %  sodium chloride infusion, , Intravenous, Continuous, Troy Sine, MD, Stopped at 03/26/19 [phone] .  0.9 %  sodium chloride infusion, 250 mL, Intravenous, PRN, Shelva Majestic A, MD .  0.9 %  sodium chloride infusion, 250 mL, Intravenous, PRN, Lorretta Harp, MD .  0.9 %  sodium chloride infusion, , Intravenous, Continuous, Runell GessBerry, Jonathan J, MD, Last Rate: 50 mL/hr at 03/27/19 0600 .  acetaminophen (TYLENOL) tablet 650 mg, 650 mg, Oral, Q4H PRN, Lennette BihariKelly, Thomas A, MD .  Melene Muller[START ON 03/28/2019] aspirin EC tablet 81 mg, 81 mg, Oral, Daily, Lennette BihariKelly, Thomas A, MD .  atorvastatin (LIPITOR) tablet 80 mg, 80 mg, Oral, q1800, Lennette BihariKelly, Thomas A, MD, 80 mg at 03/26/19 1807 .  Chlorhexidine Gluconate Cloth 2 % PADS 6 each, 6 each, Topical, Daily, Lennette BihariKelly, Thomas A, MD, 6 each at 03/26/19 2050 .  [START ON 03/28/2019] clopidogrel (PLAVIX) tablet 75 mg, 75 mg, Oral, Daily, Lennette BihariKelly, Thomas A, MD .  diazepam (VALIUM) tablet 5 mg, 5 mg, Oral, Q6H PRN, Lennette BihariKelly,  Thomas A, MD .  heparin ADULT infusion 100 units/mL (25000 units/21250mL sodium chloride 0.45%), 1,400 Units/hr, Intravenous, Continuous, Lennette BihariKelly, Thomas A, MD, Last Rate: 14 mL/hr at 03/27/19 0600, 1,400 Units/hr at 03/27/19 0600 .  levothyroxine (SYNTHROID) tablet 100 mcg, 100 mcg, Oral, Q0600, Lennette BihariKelly, Thomas A, MD, 100 mcg at 03/27/19 [phone] .  norepinephrine (LEVOPHED) 4mg  in 250mL premix infusion, 0-40 mcg/min, Intravenous, Titrated, Lennette BihariKelly, Thomas A, MD .  ondansetron [facility](ZOFRAN) injection 4 mg, 4 mg, Intravenous, Q6H PRN, Nicki GuadalajaraKelly, Thomas A, MD .  sodium chloride flush (NS) 0.9 % injection 3 mL, 3 mL, Intravenous, Q12H, Lennette BihariKelly, Thomas A, MD, 3 mL at 03/26/19 0655 .  sodium chloride flush (NS) 0.9 % injection 3 mL, 3 mL, Intravenous, PRN, Lennette BihariKelly, Thomas A, MD .  sodium chloride flush (NS) 0.9 % injection 3 mL, 3 mL, Intravenous, Q12H, Runell GessBerry, Jonathan J, MD .  sodium chloride flush (NS) 0.9 % injection 3 mL, 3 mL, Intravenous, PRN, Runell GessBerry, Jonathan J, MD .  zolpidem (AMBIEN) tablet 5 mg, 5 mg, Oral, QHS PRN,MR X 1, Lennette BihariKelly, Thomas A, MD:  . Melene Muller[START ON 03/28/2019] aspirin EC  81 mg Oral Daily  . atorvastatin  80 mg Oral q1800  . Chlorhexidine Gluconate Cloth  6 each Topical Daily  . [START ON 03/28/2019] clopidogrel  75 mg Oral Daily  . levothyroxine  100 mcg Oral Q0600  . sodium chloride flush  3 mL Intravenous Q12H  . sodium chloride flush  3 mL Intravenous Q12H  :  No Known Allergies:  No family history on file.:  Social History   Socioeconomic History  . Marital status: Single    Spouse name: Not on file  . Number of children: Not on file  . Years of education: Not on file  . Highest education level: Not on file  Occupational History  . Not on file  Social Needs  . Financial resource strain: Not on file  . Food insecurity    Worry: Not on file    Inability: Not on file  . Transportation needs    Medical: Not on file    Non-medical: Not on file  Tobacco Use  . Smoking status: Never Smoker   . Smokeless tobacco: Current User  Substance and Sexual Activity  . Alcohol use: Yes  . Drug use: Not Currently  . Sexual activity: Not on file  Lifestyle  . Physical activity    Days per week: Not on file    Minutes per session: Not on file  . Stress: Not on file  Relationships  . Social Musicianconnections    Talks on phone: Not on file    Gets together: Not on file    Attends religious service: Not on file    Active member of  club or organization: Not on file    Attends meetings of clubs or organizations: Not on file    Relationship status: Not on file  . Intimate partner violence    Fear of current or ex partner: Not on file    Emotionally abused: Not on file    Physically abused: Not on file    Forced sexual activity: Not on file  Other Topics Concern  . Not on file  Social History Narrative  . Not on file  :  Review of Systems  Constitutional: Negative.   HENT: Negative.   Eyes: Negative.   Respiratory: Negative.   Cardiovascular: Negative.   Gastrointestinal: Negative.   Genitourinary: Negative.   Musculoskeletal: Negative.   Skin: Negative.   Neurological: Negative.   Endo/Heme/Allergies: Negative.   Psychiatric/Behavioral: Negative.    .  Exam: Well-developed well-nourished white male in no obvious distress.  Vital signs are temperature of 98.  Pulse is 71.  Blood pressure 99/76.  Head neck exam shows no ocular or oral lesions.  There are no palpable cervical or supraclavicular lymph nodes.  Lungs are clear bilaterally.  Cardiac exam regular rate and rhythm with no murmurs, rubs or bruits.  Abdomen is soft.  His good bowel sounds.  There is no fluid wave.  There is no palpable liver or spleen tip.  Extremities shows no clubbing, cyanosis or edema.  He has no palpable venous cord in his legs.  Neurological exam shows no focal neurological deficits. Patient Vitals for the past 24 hrs:  BP Temp Temp src Pulse Resp SpO2 Weight  03/27/19 0600 99/76 - - - - 96 % -   03/27/19 0500 90/66 - - - - 96 % -  03/27/19 0405 - 98.3 F (36.8 C) Oral - - - -  03/27/19 0400 (!) 87/73 - - 69 - 96 % -  03/27/19 0300 95/76 - - - - 97 % -  03/27/19 0200 102/73 - - - - 93 % -  03/27/19 0100 99/75 - - - - 96 % -  03/27/19 0011 - 98.6 F (37 C) Oral - - - -  03/27/19 0000 102/68 - - - - 96 % -  03/26/19 2300 106/70 - - - - 98 % -  03/26/19 2200 103/72 - - - - 98 % -  03/26/19 2100 113/69 - - - - 98 % -  03/26/19 2029 - - - - - - 233 lb 11 oz (106 kg)  03/26/19 2000 106/78 98.4 F (36.9 C) Oral - - 98 % -  03/26/19 1900 105/65 - - - - 97 % -  03/26/19 1800 123/69 - - - - 95 % -  03/26/19 1500 - 99.1 F (37.3 C) Oral - - - -  03/26/19 1200 130/85 - - - - 98 % -  03/26/19 1100 107/69 98.8 F (37.1 C) Oral - - 96 % -  03/26/19 1000 116/90 - - - - 97 % -  03/26/19 0900 116/78 98 F (36.7 C) - - - 95 % -  03/26/19 0700 116/82 - - (!) 58 15 98 % -     Recent Labs    03/26/19 1548 03/26/19 2033  WBC 11.8* 10.5  HGB 14.1 13.9  HCT 41.8 41.2  PLT 218 226   Recent Labs    03/26/19 0307 03/26/19 2033  NA 139 137  K 4.0 4.0  CL 110 106  CO2 21* 23  GLUCOSE 125* 121*  BUN 14 13  CREATININE 0.88 0.97  CALCIUM 8.9 9.2    Blood smear review: None  Pathology: None    Assessment and Plan: Randy Cannon is a nice 43 year old white male.  He has factor V Leiden mutation.  This is heterozygous.  He had a significant thromboembolic event back in 2012.  He is on lifelong anticoagulation because of this.  I am still not sure how significant the role was of the factor V Leiden with his coronary artery event.  Again, it is unusual for factor V Leiden to cause arterial thrombi, outside of that associated with pulmonary arteries.  Regardless, I do believe that Randy Cannon is hypercoagulable.  As such, we probably have to get his Xarelto back up to therapeutic dosing.  I do not see a problem with him being on Plavix or aspirin.  Xarelto works in a totally  different mechanism.  Whereas Plavix and aspirin or antiplatelet agents, Xarelto is a specific inhibitor of factor Xa.  I am just thankful that he was able to get such great care in the field that was so quick and got him to the hospital where Dr. Tresa EndoKelly did a fantastic job in getting the thrombus out of the RCA.  He is on heparin and argatroban.  Once these are discontinued and he has had his cardiac cath today, then he will be able to take oral anticoagulants, I would then put him on Xarelto at 20 mg p.o. daily.  I really do not think he needs any type of "loading dose."  I will definitely have to get him back to the office for a follow-up.  I think he is probably on a statin drug now.  I am unsure what his cholesterol is.  I think that is a hereditary issue with him.  If there is anything else we can do to help Randy Cannon, we shall.  I very much appreciate Dr. Allyson SabalBerry letting me know that he was in the CCU.  It is apparent that he is getting fantastic care by all the staff in the CCU.  He has high praise for everybody who is helped him.  Christin BachPete Ennever, MD  [place] 8:10

## 2019-03-27 NOTE — H&P (View-Only) (Signed)
° °Progress Note ° °Patient Name: Randy Cannon °Date of Encounter: 03/27/2019 ° °Primary Cardiologist: Dr. Tom Kelly ° °Subjective  ° °Postop day #2 inferoposterior STEMI with RV involvement status post aspiration thrombectomy, PCI and drug-eluting stenting using a resolute Onyx drug-eluting stent by Dr. Kelly via the right radial approach.  Patient is pain-free on IV heparin .  Plan staged distal RCA trifurcation PCI and drug-eluting stenting plus or minus diagonal branch intervention. ° °Inpatient Medications  °  °Scheduled Meds: °• [START ON 03/28/2019] aspirin EC  81 mg Oral Daily  °• atorvastatin  80 mg Oral q1800  °• Chlorhexidine Gluconate Cloth  6 each Topical Daily  °• [START ON 03/28/2019] clopidogrel  75 mg Oral Daily  °• levothyroxine  100 mcg Oral Q0600  °• sodium chloride flush  3 mL Intravenous Q12H  °• sodium chloride flush  3 mL Intravenous Q12H  ° °Continuous Infusions: °• sodium chloride 20 mL/hr (03/25/19 2216)  °• sodium chloride Stopped (03/26/19 0722)  °• sodium chloride    °• sodium chloride    °• sodium chloride 50 mL/hr at 03/27/19 0600  °• heparin 1,550 Units/hr (03/27/19 0856)  °• norepinephrine (LEVOPHED) Adult infusion    ° °PRN Meds: °sodium chloride, sodium chloride, acetaminophen, diazepam, ondansetron (ZOFRAN) IV, sodium chloride flush, sodium chloride flush, zolpidem  ° °Vital Signs  °  °Vitals:  ° 03/27/19 0600 03/27/19 0700 03/27/19 0800 03/27/19 0817  °BP: 99/76 101/78 103/74   °Pulse:      °Resp:      °Temp:    98.7 °F (37.1 °C)  °TempSrc:    Oral  °SpO2: 96% 96% 93%   °Weight:      °Height:      ° ° °Intake/Output Summary (Last 24 hours) at 03/27/2019 0929 °Last data filed at 03/27/2019 0700 °Gross per 24 hour  °Intake 1022.73 ml  °Output 1600 ml  °Net -577.27 ml  ° °Last 3 Weights 03/26/2019 03/26/2019 03/25/2019  °Weight (lbs) 233 lb 11 oz 234 lb 2.1 oz 235 lb  °Weight (kg) 106 kg 106.2 kg 106.595 kg  °   ° °Telemetry  °  °Sinus rhythm- Personally Reviewed ° °ECG  °  °Normal  sinus rhythm at 68 with inferior Q waves, inferolateral T wave inversion in early R wave transition consistent with an inferoposterior myocardial infarction.- Personally Reviewed ° °Physical Exam  ° °GEN: No acute distress.   °Neck: No JVD °Cardiac: RRR, no murmurs, rubs, or gallops.  °Respiratory: Clear to auscultation bilaterally. °GI: Soft, nontender, non-distended  °MS: No edema; No deformity. °Neuro:  Nonfocal  °Psych: Normal affect  °Extremities: His right forearm is wrapped potentially from the table bleeding from his anticoagulation.  He does have a 2+ right radial pulse. ° °Labs  °  °High Sensitivity Troponin:   °Recent Labs  °Lab 03/25/19 °2217 03/26/19 °0307  °TROPONINIHS 29* 8,288*  °   ° °Cardiac EnzymesNo results for input(s): TROPONINI in the last 168 hours. No results for input(s): TROPIPOC in the last 168 hours.  ° °Chemistry °Recent Labs  °Lab 03/25/19 °2217 03/25/19 °2312 03/25/19 °2313 03/26/19 °0307 03/26/19 °2033  °NA 141 141  --  139 137  °K 4.3 4.1  --  4.0 4.0  °CL 108  --   --  110 106  °CO2 21*  --   --  21* 23  °GLUCOSE 115*  --   --  125* 121*  °BUN 19  --   --  14 13  °  CREATININE 1.18  --  0.80 0.88 0.97  °CALCIUM 9.9  --   --  8.9 9.2  °PROT 7.9  --   --   --   --   °ALBUMIN 4.9  --   --   --   --   °AST 29  --   --   --   --   °ALT 51*  --   --   --   --   °ALKPHOS 64  --   --   --   --   °BILITOT 0.9  --   --   --   --   °GFRNONAA >60  --   --  >60 >60  °GFRAA >60  --   --  >60 >60  °ANIONGAP 12  --   --  8 8  °  ° °Hematology °Recent Labs  °Lab 03/26/19 °0307 03/26/19 °1548 03/26/19 °2033  °WBC 12.7* 11.8* 10.5  °RBC 4.97 4.81 4.69  °HGB 14.6 14.1 13.9  °HCT 42.9 41.8 41.2  °MCV 86.3 86.9 87.8  °MCH 29.4 29.3 29.6  °MCHC 34.0 33.7 33.7  °RDW 12.5 12.8 13.0  °PLT 231 218 226  ° ° °BNPNo results for input(s): BNP, PROBNP in the last 168 hours.  ° °DDimer No results for input(s): DDIMER in the last 168 hours.  ° °Radiology  °  °Dg Chest Portable 1 View ° °Result Date:  03/25/2019 °CLINICAL DATA:  Code STEMI EXAM: PORTABLE CHEST 1 VIEW COMPARISON:  04/22/2011 FINDINGS: The heart size and mediastinal contours are within normal limits. Both lungs are clear. The visualized skeletal structures are unremarkable. IMPRESSION: No active disease. Electronically Signed   By: Kim  Fujinaga M.D.   On: 03/25/2019 22:46  ° ° °Cardiac Studies  ° °Cardiac catheterization/PCI and stent (03/25/2019) ° °Conclusion ° °  °· 1st RPL lesion is 80% stenosed. °· RPDA lesion is 80% stenosed. °· Dist RCA lesion is 80% stenosed. °· Prox RCA lesion is 100% stenosed. °· Ramus lesion is 70% stenosed. °· 1st Diag lesion is 90% stenosed. °· Mid Cx to Dist Cx lesion is 100% stenosed. °· There is mild left ventricular systolic dysfunction. °· LV end diastolic pressure is normal. °· The left ventricular ejection fraction is 50-55% by visual estimate. °· 3rd RPL-1 lesion is 60% stenosed. °· 3rd RPL-2 lesion is 100% stenosed. °· A drug-eluting stent was successfully placed. °· Post intervention, there is a 0% residual stenosis. °  °Acute inferior ST segment elevation myocardial infarction secondary to total occlusion of a very large dominant RCA proximally with extensive thrombus burden. °  °Multivessel CAD with percent ostial tapering of a long left main; 90% proximal stenosis in a very proximal diagonal branch of the LAD with a normal LAD extending to the apex; 70% proximal stenosis in the ramus intermediate vessel; total occlusion of the mid left circumflex coronary artery with retrograde filling of the distal obtuse marginal branch from LAD collaterals; and total proximal occlusion of a very large dominant RCA with extensive thrombus burden there is some faint collateralization to the PDA a and PLA vessels from the left coronary circulation. °  °Difficult but successful PCI to the large RCA requiring PTCA, extensive thrombectomy, Aggrastat with reperfusion induced hypotension and bradycardia necessitating significant  fluid resuscitation, atropine and Levophed with ultimate insertion of a 4.0 x 22 mm Resolute Onyx DES stent postdilated to 4.4 mm with the 100% occlusion being reduced to 0% and resumption of brisk TIMI-3 flow.  There is a very complex   80% trifurcation stenosis in the distal RCA at the takeoff of the PDA large inferior LV branch and continuation branch with thrombus in the mid PLA just collateralized from the left circulation. ° ° °  °Mild acute LV dysfunction with inferobasal hypocontractility. °  °RECOMMENDATION: °The patient left the catheterization laboratory on Cangrelor which will be discontinued in the CCU with subsequent administration of Plavix 600 mg daily.  Patient will be maintained on Aggrastat for at least 24 hours and possibly until Friday a.m. prior to subsequent planned intervention to the complex trifurcation distal LAD stenosis and possibly the diagonal and ramus vessels.  Will ask colleagues to review. At present Xarelto will be held but ultimately he will need to be on triple drug therapy with aspirin/Plavix and Xarelto for 1 month and then Plavix/Xarelto.  Plan echo Doppler in a.m.  Titrate to maximum statin dose.  With the patient's history of significant hyperlipidemia at a young age consider possible familial hyperlipidemia and if LDL cannot reach target would initiate PCSK9 inhibition. °   ° ° °2D echocardiogram (03/26/2019) ° °IMPRESSIONS °  °  ° 1. The left ventricle has low normal systolic function, with an ejection fraction of 50-55%. The cavity size was normal. Left ventricular diastolic parameters were normal. ° 2. Inferior hypokinesis, particularly in the basal segment. ° 3. The right ventricle has moderately reduced systolic function. The cavity was normal. There is no increase in right ventricular wall thickness. ° 4. The mitral valve is abnormal. Mild thickening of the mitral valve leaflet. There is mild mitral annular calcification present. ° 5. The tricuspid valve is grossly  normal. ° 6. The aortic valve is tricuspid. Mild thickening of the aortic valve. ° 7. The inferior vena cava was dilated in size with <50% respiratory variability. ° 8. The interatrial septum was not assessed. °  ° ° °Patient Profile  °   °42 y.o. mildly overweight married Caucasian male father of 1 3-year-old child with a prior history of factor V Leiden deficiency with multiple thrombotic episodes and including iliac stenting and IVC filter implantation.  He has been on Xarelto 10 mg a day for prophylaxis.  He has no other cardiac risk factors other than hyperlipidemia.  He was not biking developed chest pain.  EMS was called and they evacuated him via boat.  He was brought emergently to Cath Lab underwent radial diagnostic cath and intervention by Dr. Kelly with an occluded dominant RCA, occluded nondominant circumflex in the midportion, moderate disease in the proximal large ramus branch and diagonal branch disease.  His EF was preserved with inferobasal hypokinesia.  He underwent aspiration thrombectomy of a large thrombus burden, PCI and drug-eluting stenting of the proximal RCA using resolute Onyx drug-eluting stent.  He had restoration of antegrade flow with hypotension suggesting RV involvement requiring fluid resuscitation and treatment with atropine and pressors.  Ultimately he hemodynamically stabilized.  He had trifurcation lesion at his distal RCA and PDA/PLA which was not intervened on requiring staged intervention.  He was placed on Cangrelor in the Cath Lab as well as heparin and Aggrastat.  The cangrelor was discontinued and the patient was loaded with Plavix.  He remained stable over the last 24 hours on IV heparin with plans to perform staged distal RCA and diagonal branch intervention by Dr. Kelly today. ° °Assessment & Plan  °  °1: Inferoposterior STEMI- postop day 1 inferoposterior STEMI treated with PCI and drug-eluting stenting along with aspiration thrombectomy of an occluded dominant  proximal RCA with excellent intragraft result.    He had residual downstream disease trifurcation which is not intervened on.  He also had an occluded nondominant circumflex in the mid AV groove, moderate proximal segmental ramus branch stenosis and a moderate size vessel as well as a moderately severe diagonal branch stenosis with preserved LV function.  He is on low-dose aspirin and Plavix as well as IV heparin and Aggrastat for large thrombus burden.  The Aggrastat was ordered for 24 hours .  He has been maintained on IV heparin is remained stable.  He is scheduled for staged distal RCA trifurcation PCI and stenting in addition to diagonal branch intervention. ° °2: Hyperlipidemia- on high-dose statin therapy ° °3: Factor V Leiden deficiency- on Xarelto of 10 mg a day previously for prophylaxis because of multiple thrombotic episodes in the past.  After speaking with Dr. Ennever, his hematologist, it was decided ultimately to put him on 20 mg of Xarelto at discharge.  Dr. Ennever saw him this morning.  Plans will be to increase his Xarelto from 10 to 20 mg a day as a maintenance dose.  He will be placed on low-dose aspirin and Plavix along with Xarelto for 1 month after which aspirin will be discontinued. ° °For questions or updates, please contact [facility] °Please consult [email or web address] for contact info under  ° °  °   °Signed, °Fernande Treiber, MD  °03/27/2019, 9:29 AM   ° °

## 2019-03-27 NOTE — Progress Notes (Signed)
ANTICOAGULATION CONSULT NOTE   Pharmacy Consult for heparin and tirofiban Indication: factor V leiden deficiency, h/o recurrent DVTs, and STEMI  No Known Allergies  Patient Measurements: Height: 5\' 11"  (180.3 cm) Weight: 233 lb 11 oz (106 kg) IBW/kg (Calculated) : 75.3 Heparin Dosing Weight: 100kg  Vital Signs: Temp: 98.7 F (37.1 C) (06/26 0817) Temp Source: Oral (06/26 0817) BP: 103/74 (06/26 0800) Pulse Rate: 69 (06/26 0400)  Labs: Recent Labs    03/25/19 2217  03/25/19 2313  03/26/19 0307 03/26/19 1548 03/26/19 2033 03/27/19 0019 03/27/19 0721  HGB 16.0   < >  --   --  14.6 14.1 13.9  --   --   HCT 47.6   < >  --   --  42.9 41.8 41.2  --   --   PLT 236  --   --   --  231 218 226  --   --   APTT 25  --   --   --   --  40*  --   --   --   LABPROT 13.3  --   --   --   --   --   --   --   --   INR 1.0  --   --   --   --   --   --   --   --   HEPARINUNFRC  --   --   --    < > 0.19* <0.10*  --  0.22* 0.24*  CREATININE 1.18  --  0.80  --  0.88  --  0.97  --   --    < > = values in this interval not displayed.    Estimated Creatinine Clearance: 120.4 mL/min (by C-G formula based on SCr of 0.97 mg/dL).   Medical History: Past Medical History:  Diagnosis Date  . DVT (deep venous thrombosis) ([place])   . DVT of proximal leg (deep vein thrombosis) ([place]) 09/07/2011  . Factor V Leiden mutation ([place]) 09/07/2011  . Hyperlipidemia   . Thyroid disease     Medications:  Medications Prior to Admission  Medication Sig Dispense Refill Last Dose  . acetaminophen (TYLENOL) 325 MG tablet Take 325-650 mg by mouth every 8 (eight) hours as needed for mild pain, moderate pain or headache.    Past Week at Unknown time  . atorvastatin (LIPITOR) 40 MG tablet Take 40 mg by mouth at bedtime.    03/24/2019 at pm  . cetirizine (ZYRTEC) 10 MG tablet Take 10 mg by mouth daily as needed (for seasonal allergies).    03/25/2019 at am  . levothyroxine (SYNTHROID) 100 MCG tablet Take 100 mcg by mouth  at bedtime.    03/24/2019 at pm  . XARELTO 10 MG TABS tablet Take 10 mg by mouth at bedtime.    03/24/2019 at 2200  . XARELTO 20 MG TABS tablet TAKE 1 TABLET (20 MG TOTAL) BY MOUTH DAILY. (Patient not taking: Reported on 03/26/2019) 30 tablet 10 Not Taking at Unknown time   Scheduled:  . [START ON 03/28/2019] aspirin EC  81 mg Oral Daily  . atorvastatin  80 mg Oral q1800  . Chlorhexidine Gluconate Cloth  6 each Topical Daily  . [START ON 03/28/2019] clopidogrel  75 mg Oral Daily  . levothyroxine  100 mcg Oral Q0600  . sodium chloride flush  3 mL Intravenous Q12H  . sodium chloride flush  3 mL Intravenous Q12H   Infusions:  . sodium chloride 20  mL/hr (03/25/19 2216)  . sodium chloride Stopped (03/26/19 0722)  . sodium chloride    . sodium chloride    . sodium chloride 50 mL/hr at 03/27/19 0600  . heparin 1,400 Units/hr (03/27/19 0600)  . norepinephrine (LEVOPHED) Adult infusion      Assessment: 43yo male presents as code STEMI now post-cath with thrombectomy and stent for RCA thrombus on 6/24. Completed 24 hours of tirofiban. Staged PCI planned with return to cath lab 6/26 @ 1030. Patient was on Xarelto 10mg  daily prior to admission for factor V leiden deficiency and h/o recurrent DVTs with IVC filter, last dose 6/23 pm. Pharmacy consulted to dose heparin.  Heparin level this morning remains subtherapeutic at 0.24 after dose increase last night. Stable CBC, no bleeding reported by patient, and no issues with heparin infusion overnight.   Goal of Therapy:  Heparin level 0.3-0.7 units/ml Monitor platelets by anticoagulation protocol: Yes   Plan:  Increase IV heparin to 1550 units/hr. Daily CBC and heparin level while on heparin Monitor for s/sx bleeding Per Dr. Myna Hidalgo, will switch to Xarelto 20mg  daily once all cath procedures complete   Harlow Mares, PharmD PGY1 Pharmacy Resident Phone [phone]  03/27/2019   8:48 AM

## 2019-03-28 LAB — CBC
HCT: 40.6 % (ref 39.0–52.0)
Hemoglobin: 13.6 g/dL (ref 13.0–17.0)
MCH: 29.2 pg (ref 26.0–34.0)
MCHC: 33.5 g/dL (ref 30.0–36.0)
MCV: 87.3 fL (ref 80.0–100.0)
Platelets: 199 10*3/uL (ref 150–400)
RBC: 4.65 MIL/uL (ref 4.22–5.81)
RDW: 13 % (ref 11.5–15.5)
WBC: 8.1 10*3/uL (ref 4.0–10.5)
nRBC: 0 % (ref 0.0–0.2)

## 2019-03-28 LAB — BASIC METABOLIC PANEL
Anion gap: 8 (ref 5–15)
BUN: 8 mg/dL (ref 6–20)
CO2: 22 mmol/L (ref 22–32)
Calcium: 9 mg/dL (ref 8.9–10.3)
Chloride: 108 mmol/L (ref 98–111)
Creatinine, Ser: 0.82 mg/dL (ref 0.61–1.24)
GFR calc Af Amer: >60 mL/min (ref >60)
GFR calc non Af Amer: >60 mL/min (ref >60)
Glucose, Bld: 103 mg/dL — ABNORMAL HIGH (ref 70–99)
Potassium: 3.7 mmol/L (ref 3.5–5.1)
Sodium: 138 mmol/L (ref 135–145)

## 2019-03-28 LAB — POCT ACTIVATED CLOTTING TIME
Activated Clotting Time: 213 seconds
Activated Clotting Time: 169 seconds

## 2019-03-28 MED ORDER — METOPROLOL SUCCINATE ER 25 MG PO TB24
12.5000 mg | ORAL_TABLET | Freq: Every day | ORAL | Status: DC
  Administered 2019-03-28: 10:00:00 12.5 mg via ORAL
  Filled 2019-03-28: qty 1

## 2019-03-28 MED ORDER — RIVAROXABAN 15 MG PO TABS
15.0000 mg | ORAL_TABLET | Freq: Every day | ORAL | Status: DC
  Filled 2019-03-28: qty 1

## 2019-03-28 MED ORDER — ATORVASTATIN CALCIUM 80 MG PO TABS: 80.0000 mg | ORAL_TABLET | Freq: Every day | ORAL | 3 refills | Status: DC

## 2019-03-28 MED ORDER — NITROGLYCERIN 0.4 MG SL SUBL: 0.4000 mg | SUBLINGUAL_TABLET | SUBLINGUAL | 3 refills | Status: DC | PRN

## 2019-03-28 MED ORDER — ASPIRIN 81 MG PO CHEW: 81.0000 mg | CHEWABLE_TABLET | Freq: Every day | ORAL | 0 refills | Status: DC

## 2019-03-28 MED ORDER — CLOPIDOGREL BISULFATE 75 MG PO TABS: 75.0000 mg | ORAL_TABLET | Freq: Every day | ORAL | 3 refills | Status: DC

## 2019-03-28 MED ORDER — RIVAROXABAN 15 MG PO TABS: 15.0000 mg | ORAL_TABLET | Freq: Every day | ORAL | 3 refills | Status: DC

## 2019-03-28 MED ORDER — METOPROLOL SUCCINATE ER 25 MG PO TB24: 12.5000 mg | ORAL_TABLET | Freq: Every day | ORAL | 3 refills | Status: DC

## 2019-03-28 NOTE — Progress Notes (Signed)
CARDIAC REHAB PHASE I   PRE:  Rate/Rhythm: SR 75  BP:  Sitting: 106/76     MODE:  Ambulation: 1850 ft   POST:  Rate/Rhythm: SR 80  BP:  Sitting: 122/80      1054-1130 Pt ambulated 1848ft independently.  No complaints. Pt to recliner after walk.  Discussed exercise guidelines with pt and new stents.  Handouts given.   Noel Christmas, RN 03/28/2019 11:27 AM

## 2019-03-28 NOTE — Discharge Instructions (Signed)

## 2019-03-28 NOTE — Progress Notes (Signed)
Patient ID: Randy Cannon, male   DOB: 08/26/1976, [address]   MRN: [id number]   Progress Note  Patient Name: Randy Cannon Date of Encounter: 03/28/2019  Primary Cardiologist: Dr. Daphene Jaegerom Kelly  Subjective   Postop day #3 inferoposterior STEMI with RV involvement status post aspiration thrombectomy, PCI and drug-eluting stenting to proximal RCA using a resolute Onyx drug-eluting stent by Dr. Tresa EndoKelly via the right radial approach.  On 6/26, he had a staged intervention with DES to proximal D1, DES to distal RCA, PTCA ostial PDA1 and DES to ostial PDA2.    Echo showed EF 50-55%, moderately decreased RV systolic function.   This morning, no chest pain.  He walked the halls without dyspnea.   Inpatient Medications    Scheduled Meds:  aspirin  81 mg Oral Daily   atorvastatin  80 mg Oral q1800   Chlorhexidine Gluconate Cloth  6 each Topical Daily   clopidogrel  75 mg Oral Q breakfast   levothyroxine  100 mcg Oral Q0600   metoprolol succinate  12.5 mg Oral Daily   sodium chloride flush  3 mL Intravenous Q12H   sodium chloride flush  3 mL Intravenous Q12H   Continuous Infusions:  sodium chloride 20 mL/hr (03/25/19 2216)   sodium chloride Stopped (03/26/19 0722)   sodium chloride     sodium chloride Stopped (03/28/19 0745)   sodium chloride     norepinephrine (LEVOPHED) Adult infusion     PRN Meds: sodium chloride, sodium chloride, acetaminophen, diazepam, ondansetron (ZOFRAN) IV, zolpidem   Vital Signs    Vitals:   03/28/19 0730 03/28/19 0741 03/28/19 0845 03/28/19 0945  BP: 96/67  114/69   Pulse:    70  Resp:      Temp:  98.7 F (37.1 C)    TempSrc:  Oral    SpO2:  97%    Weight:      Height:        Intake/Output Summary (Last 24 hours) at 03/28/2019 0957 Last data filed at 03/28/2019 0800 Gross per 24 hour  Intake 2625.7 ml  Output 2700 ml  Net -74.3 ml   Last 3 Weights 03/26/2019 03/26/2019 03/25/2019  Weight (lbs) 233 lb 11 oz 234 lb 2.1 oz 235 lb    Weight (kg) 106 kg 106.2 kg 106.595 kg      Telemetry    Sinus rhythm- Personally Reviewed  ECG    Normal sinus rhythm at 68 with inferior Q waves, inferolateral T wave inversion in early R wave transition consistent with an inferoposterior myocardial infarction.- Personally Reviewed  Physical Exam   General: NAD Neck: JVP 8 cm, no thyromegaly or thyroid nodule.  Lungs: Clear to auscultation bilaterally with normal respiratory effort. CV: Nondisplaced PMI.  Heart regular S1/S2, no S3/S4, no murmur.  No peripheral edema.   Abdomen: Soft, nontender, no hepatosplenomegaly, no distention.  Skin: Intact without lesions or rashes.  Neurologic: Alert and oriented x 3.  Psych: Normal affect. Extremities: No clubbing or cyanosis. Right radial cath site benign.  HEENT: Normal.    Labs    High Sensitivity Troponin:   Recent Labs  Lab 03/25/19 2217 03/26/19 0307  TROPONINIHS 29* 8,288*      Cardiac EnzymesNo results for input(s): TROPONINI in the last 168 hours. No results for input(s): TROPIPOC in the last 168 hours.   Chemistry Recent Labs  Lab 03/25/19 2217  03/26/19 0307 03/26/19 2033 03/28/19 0240  NA 141   < > 139 137 138  K  4.3   < > 4.0 4.0 3.7  CL 108  --  110 106 108  CO2 21*  --  21* 23 22  GLUCOSE 115*  --  125* 121* 103*  BUN 19  --  14 13 8   CREATININE 1.18   < > 0.88 0.97 0.82  CALCIUM 9.9  --  8.9 9.2 9.0  PROT 7.9  --   --   --   --   ALBUMIN 4.9  --   --   --   --   AST 29  --   --   --   --   ALT 51*  --   --   --   --   ALKPHOS 64  --   --   --   --   BILITOT 0.9  --   --   --   --   GFRNONAA >60  --  >60 >60 >60  GFRAA >60  --  >60 >60 >60  ANIONGAP 12  --  8 8 8    < > = values in this interval not displayed.     Hematology Recent Labs  Lab 03/26/19 1548 03/26/19 2033 03/28/19 0240  WBC 11.8* 10.5 8.1  RBC 4.81 4.69 4.65  HGB 14.1 13.9 13.6  HCT 41.8 41.2 40.6  MCV 86.9 87.8 87.3  MCH 29.3 29.6 29.2  MCHC 33.7 33.7 33.5  RDW 12.8  13.0 13.0  PLT 218 226 199    BNPNo results for input(s): BNP, PROBNP in the last 168 hours.   DDimer No results for input(s): DDIMER in the last 168 hours.   Radiology    No results found.  Cardiac Studies   Cardiac catheterization/PCI and stent (03/25/2019)  Conclusion    1st RPL lesion is 80% stenosed.  RPDA lesion is 80% stenosed.  Dist RCA lesion is 80% stenosed.  Prox RCA lesion is 100% stenosed.  Ramus lesion is 70% stenosed.  1st Diag lesion is 90% stenosed.  Mid Cx to Dist Cx lesion is 100% stenosed.  There is mild left ventricular systolic dysfunction.  LV end diastolic pressure is normal.  The left ventricular ejection fraction is 50-55% by visual estimate.  3rd RPL-1 lesion is 60% stenosed.  3rd RPL-2 lesion is 100% stenosed.  A drug-eluting stent was successfully placed.  Post intervention, there is a 0% residual stenosis.   Acute inferior ST segment elevation myocardial infarction secondary to total occlusion of a very large dominant RCA proximally with extensive thrombus burden.  Multivessel CAD with percent ostial tapering of a long left main; 90% proximal stenosis in a very proximal diagonal branch of the LAD with a normal LAD extending to the apex; 70% proximal stenosis in the ramus intermediate vessel; total occlusion of the mid left circumflex coronary artery with retrograde filling of the distal obtuse marginal branch from LAD collaterals; and total proximal occlusion of a very large dominant RCA with extensive thrombus burden there is some faint collateralization to the PDA a and PLA vessels from the left coronary circulation.  Difficult but successful PCI to the large RCA requiring PTCA, extensive thrombectomy, Aggrastat with reperfusion induced hypotension and bradycardia necessitating significant fluid resuscitation, atropine and Levophed with ultimate insertion of a 4.0 x 22 mm Resolute Onyx DES stent postdilated to 4.4 mm with the 100%  occlusion being reduced to 0% and resumption of brisk TIMI-3 flow.  There is a very complex 80% trifurcation stenosis in the distal RCA at the takeoff of  the PDA large inferior LV branch and continuation branch with thrombus in the mid PLA just collateralized from the left circulation.    Mild acute LV dysfunction with inferobasal hypocontractility.  RECOMMENDATION: The patient left the catheterization laboratory on Cangrelor which will be discontinued in the CCU with subsequent administration of Plavix 600 mg daily.  Patient will be maintained on Aggrastat for at least 24 hours and possibly until Friday a.m. prior to subsequent planned intervention to the complex trifurcation distal LAD stenosis and possibly the diagonal and ramus vessels.  Will ask colleagues to review. At present Xarelto will be held but ultimately he will need to be on triple drug therapy with aspirin/Plavix and Xarelto for 1 month and then Plavix/Xarelto.  Plan echo Doppler in a.m.  Titrate to maximum statin dose.  With the patient's history of significant hyperlipidemia at a young age consider possible familial hyperlipidemia and if LDL cannot reach target would initiate PCSK9 inhibition.     2D echocardiogram (03/26/2019)  IMPRESSIONS    1. The left ventricle has low normal systolic function, with an ejection fraction of 50-55%. The cavity size was normal. Left ventricular diastolic parameters were normal.  2. Inferior hypokinesis, particularly in the basal segment.  3. The right ventricle has moderately reduced systolic function. The cavity was normal. There is no increase in right ventricular wall thickness.  4. The mitral valve is abnormal. Mild thickening of the mitral valve leaflet. There is mild mitral annular calcification present.  5. The tricuspid valve is grossly normal.  6. The aortic valve is tricuspid. Mild thickening of the aortic valve.  7. The inferior vena cava was dilated in size with <50%  respiratory variability.  8. The interatrial septum was not assessed.    Patient Profile     43 y.o. mildly overweight married Caucasian male father of 33 30-year-old child with a prior history of factor V Leiden deficiency with multiple thrombotic episodes and including iliac stenting and IVC filter implantation.  He has been on Xarelto 10 mg a day for prophylaxis.  He has no other cardiac risk factors other than hyperlipidemia.  He was not biking developed chest pain.  EMS was called and they evacuated him via boat.  He was brought emergently to Cath Lab underwent radial diagnostic cath and intervention by Dr. Tresa Endo with an occluded dominant RCA, occluded nondominant circumflex in the midportion, moderate disease in the proximal large ramus branch and diagonal branch disease.  His EF was preserved with inferobasal hypokinesia.  He underwent aspiration thrombectomy of a large thrombus burden, PCI and drug-eluting stenting of the proximal RCA using resolute Onyx drug-eluting stent.  He had restoration of antegrade flow with hypotension suggesting RV involvement requiring fluid resuscitation and treatment with atropine and pressors.  Ultimately he hemodynamically stabilized.  He had trifurcation lesion at his distal RCA and PDA/PLA which was not intervened on requiring staged intervention.  He was placed on Cangrelor in the Cath Lab as well as heparin and Aggrastat.  The cangrelor was discontinued and the patient was loaded with Plavix.  He remained stable over the last 24 hours on IV heparin with plans to perform staged distal RCA and diagonal branch intervention by Dr. Tresa Endo today.  Assessment & Plan    1: Inferoposterior STEMI- s/p inferoposterior STEMI with RV involvement treated with PCI and drug-eluting stenting along with aspiration thrombectomy of an occluded dominant proximal RCA with excellent result.  He had residual downstream disease at the RCA trifurcation which was not  intervened on  initially.  He also had an occluded nondominant circumflex in the mid AV groove, moderate proximal segmental ramus branch stenosis and a moderate size vessel as well as a moderately severe diagonal branch stenosis.  On 6/26, he had staged intervention with DES to D1, DES to distal RCA, PTCA ostial PDA1, DES ostial PDA2.  No chest pain.  - Continue ASA 81 x 1 month.  - Continue Plavix 75 mg daily ideally x 1 year.  - Xarelto 15 mg daily while on Plavix, then increase to 20 mg daily after Plavix stopped.  - Atorvastatin 80 mg daily.   2: Hyperlipidemia- on high-dose statin therapy  3: Factor V Leiden deficiency- on Xarelto of 10 mg a day previously for prophylaxis because of multiple thrombotic episodes in the past.  After speaking with Dr. Myna HidalgoEnnever, his hematologist, it was decided ultimately that he should be on the 20 mg dose of Xarelto.  However, as he will be on Plavix for a year, will have him on Xarelto 15 mg daily while on Plavix (extrapolating from PIONEER-AF study).   4: Ischemic cardiomyopathy/RV infarction- EF mildly depressed around 50% but also has evidence for RV infarction with moderate RV dysfunction. SBP around 100-110.  Not significantly volume overloaded on exam.  - Will start only very low dose beta blocker (Toprol XL 12.5 mg daily) this morning.   - Can start ACEI at followup if BP remains stable.   Disposition: I think he can go home after making sure he tolerates low dose Toprol XL.  He will need followup in 2 wks or so.  Meds for home: Xarelto 15 mg daily, Plavix 75 mg daily, ASA 81 daily x 1 month then stop, atorvastatin 80 mg daily, Toprol XL 12.5 mg daily.   For questions or updates, please contact [facility] Please consult [email or web address] for contact info under        Signed, Marca Anconaalton Eian Vandervelden, MD  03/28/2019, 9:57 AM

## 2019-03-28 NOTE — Discharge Summary (Signed)
Discharge Summary    Patient ID: Randy Cannon,  MRN: [id number], DOB/AGE: Nov 13, 1975 43 y.o.  Admit date: 03/25/2019 Discharge date: 03/29/2019  Primary Care Provider: [facility] [place] Primary Cardiologist: No primary care provider on file.  Discharge Diagnoses    Active Problems:   STEMI involving right coronary artery [facility])   Coronary artery disease involving native coronary artery of native heart with unstable angina pectoris (HCC)   Allergies No Known Allergies  Diagnostic Studies/Procedures    Left heart catheterization 03/25/2019:  1st RPL lesion is 80% stenosed.  RPDA lesion is 80% stenosed.  Dist RCA lesion is 80% stenosed.  Prox RCA lesion is 100% stenosed.  Ramus lesion is 70% stenosed.  1st Diag lesion is 90% stenosed.  Mid Cx to Dist Cx lesion is 100% stenosed.  There is mild left ventricular systolic dysfunction.  LV end diastolic pressure is normal.  The left ventricular ejection fraction is 50-55% by visual estimate.  3rd RPL-1 lesion is 60% stenosed.  3rd RPL-2 lesion is 100% stenosed.  A drug-eluting stent was successfully placed.  Post intervention, there is a 0% residual stenosis.   Acute inferior ST segment elevation myocardial infarction secondary to total occlusion of a very large dominant RCA proximally with extensive thrombus burden.  Multivessel CAD with percent ostial tapering of a long left main; 90% proximal stenosis in a very proximal diagonal branch of the LAD with a normal LAD extending to the apex; 70% proximal stenosis in the ramus intermediate vessel; total occlusion of the mid left circumflex coronary artery with retrograde filling of the distal obtuse marginal branch from LAD collaterals; and total proximal occlusion of a very large dominant RCA with extensive thrombus burden there is some faint collateralization to the PDA a and PLA vessels from the left coronary circulation.  Difficult but  successful PCI to the large RCA requiring PTCA, extensive thrombectomy, Aggrastat with reperfusion induced hypotension and bradycardia necessitating significant fluid resuscitation, atropine and Levophed with ultimate insertion of a 4.0 x 22 mm Resolute Onyx DES stent postdilated to 4.4 mm with the 100% occlusion being reduced to 0% and resumption of brisk TIMI-3 flow.  There is a very complex 80% trifurcation stenosis in the distal RCA at the takeoff of the PDA large inferior LV branch and continuation branch with thrombus in the mid PLA just collateralized from the left circulation.  Mild acute LV dysfunction with inferobasal hypocontractility.  RECOMMENDATION: The patient left the catheterization laboratory on Cangrelor which will be discontinued in the CCU with subsequent administration of Plavix 600 mg daily.  Patient will be maintained on Aggrastat for at least 24 hours and possibly until Friday a.m. prior to subsequent planned intervention to the complex trifurcation distal LAD stenosis and possibly the diagonal and ramus vessels.  Will ask colleagues to review. At present Xarelto will be held but ultimately he will need to be on triple drug therapy with aspirin/Plavix and Xarelto for 1 month and then Plavix/Xarelto.  Plan echo Doppler in a.m.  Titrate to maximum statin dose.  With the patient's history of significant hyperlipidemia at a young age consider possible familial hyperlipidemia and if LDL cannot reach target would initiate PCSK9 inhibition.  Echocardiogram 03/26/2019: IMPRESSIONS    1. The left ventricle has low normal systolic function, with an ejection fraction of 50-55%. The cavity size was normal. Left ventricular diastolic parameters were normal.  2. Inferior hypokinesis, particularly in the basal segment.  3. The right ventricle  has moderately reduced systolic function. The cavity was normal. There is no increase in right ventricular wall thickness.  4. The mitral valve is  abnormal. Mild thickening of the mitral valve leaflet. There is mild mitral annular calcification present.  5. The tricuspid valve is grossly normal.  6. The aortic valve is tricuspid. Mild thickening of the aortic valve.  7. The inferior vena cava was dilated in size with <50% respiratory variability.  8. The interatrial septum was not assessed.  Coronary stent intervention 03/27/2019: Successful multi lesion intervention evolving a trifurcation stenosis of the distal RCA, ostial PDA1, ostial PDA2 and ostial PLA vessel with Cutting Balloon intervention at all sites and ultimate stenting of the distal RCA into the ostium of the PDA2 with a 3.0 x 12 mm Resolute Onyx stent with the 80% stenoses being reduced to 0%; Cutting Balloon to the PDA 1 with the 80% stenosis reduced to 5%.  Following cutting balloon to the PLA vessel the ostial lesion was reduced to less than 5% but following stenting into the PDA 2 vessel there was some ostial narrowing of the PLA vessel 50 to 60%.  Repeat noncompliant dilatation in the proximal portion of the previously placed 4.0 x 22 mm Resolute stent which was additionally postdilated today to 4.56 mm with improvement in the mild digital narrowing to 0%.  Successful PCI/DES stenting of the diagonal vessel with insertion of a 2.5 x 15 mm Resolute stent postdilated to 2.75 mm with the 80% stenosis being reduced to 0%.  RECOMMENDATION; The patient will be restarted on Xarelto tomorrow and for the first month continue aspirin/Plavix and Xarelto with discontinuance of aspirin after 1 month.  Adequate therapy for the patient's totally occluded left circumflex vessel with left to left collaterals as well as the ramus intermediate vessel.  65 to 70% stenosed.  Aggressive lipid-lowering therapy with target LDL less than 70 and if he cannot achieve this would recommend PCSK9 inhibition for aggressive treatment. _____________   History of Present Illness     43 yo with history  of Factor V Leiden heterozygosity and prior DVT + IVC filter presents tonight with inferoposterior MI.  Patient is a factor V Leiden heterozygote. He has a family history of this.  He had a DVT in 2006 then bilateral iliofemoral deep vein thromboses in September 2012.  He had an IVC filter placed and was followed until 2014 by Dr. Myna HidalgoEnnever.  He has been on Xarelto long-term, last dose was yesterday (takes at night).  He also has hypothyroidism and hyperlipidemia.   No prior heart problems.  Nonsmoker.  [place]  He developed severe substernal chest pain and got down off his bike.  The chest pain was persistent.  EMS came and evacuated him by boat.  ECG showed inferoposterior acute MI.  He had NTG with resolution of most of his pain, but SBP dropped from 130 to 90 with NTG, concerning for RV involvement in MI.  No arrhythmias.  Currently, mild chest pain only.  He has had ASA and heparin bolus.   Hospital Course     Consultants: Hematology  1. STEMI: patient presented with chest pain and EKG revealing inferoposterior acute MI. Troponin peaked at 8288. He underwent emergent cardiac cath 03/25/2019 which revealed multivessel CAD with total occlusion of a very large dominant pRCA with extensive thrombus burden managed with PTCA and DES; also with total occlusion of the mLCx with retrograde filling, 90% pLAD stenosis,  and 70% p ramus intermediate stenosis. He was recommended for staged intervention to address distal RCA, pLAD, and mLCx stenosis. He went back to the cath lab 03/27/2019 and had successful multilesion intervention with PCI/DES to [facility] and PTCA to PDA, as well as PCI/DES to diagonal vessel; medical management of LCx occlusion and ramus intermediate vessel recommended. He was started on aspirin x1 month and plavix x12 months, in addition to previously prescribed xarelto for factor 5 leiden deficiency. He was recommended for aggressive risk factor  modifications.  - Continue aspirin x1 month, then stop - Continue plavix for at least 12 months - Continue xarelto as below. - Continue atorvastatin 80mg  daily; consider PSK9i if LDL not at goal of <70 on repeat lipid panel  2. Ischemic cardiomyopathy: Echo this admission revealed EF 50% with inferior hypokinesis and moderate RV systolic dysfunction. BP's were soft limiting management. He was started on low dose metoprolol succinate. He maintained euvolemic status throughout admission - Continue metoprolol succinate 12.5mg  daily - Consider addition of ACEi/ARB at follow-up if BP will tolerate  3. HLD: LDL 106 this admission; goal <70. Atorvastatin increased to 80mg  daily - Continue atorvastatin - Consider PSK9-I if LDL not improved on next lipid panel  4. Factor 5 Leiden deficiency: Hx of multiple thrombotic events. On xarelto for ppx. Follows with Dr. Marin Olp, hematology, outpatient who consulted this admission and recommended xarelto 15mg  daily while on plavix with plans to increase dose to 20mg  daily after completion of antiplatelet therapy. - Continue xarelto and follow-up with Dr. Marin Olp _____________  Discharge Vitals Blood pressure 106/76, pulse 70, temperature 98.3 F (36.8 C), temperature source Oral, resp. rate (!) 0, height 5\' 11"  (1.803 m), weight 106 kg, SpO2 97 %.  Filed Weights   03/25/19 2207 03/26/19 0100 03/26/19 2029  Weight: 106.6 kg 106.2 kg 106 kg    Labs & Radiologic Studies    CBC Recent Labs    03/26/19 2033 03/28/19 0240  WBC 10.5 8.1  HGB 13.9 13.6  HCT 41.2 40.6  MCV 87.8 87.3  PLT 226 161   Basic Metabolic Panel Recent Labs    03/26/19 2033 03/28/19 0240  NA 137 138  K 4.0 3.7  CL 106 108  CO2 23 22  GLUCOSE 121* 103*  BUN 13 8  CREATININE 0.97 0.82  CALCIUM 9.2 9.0   Liver Function Tests No results for input(s): AST, ALT, ALKPHOS, BILITOT, PROT, ALBUMIN in the last 72 hours. No results for input(s): LIPASE, AMYLASE in the last 72  hours. Cardiac Enzymes No results for input(s): CKTOTAL, CKMB, CKMBINDEX, TROPONINI in the last 72 hours. BNP Invalid input(s): POCBNP D-Dimer No results for input(s): DDIMER in the last 72 hours. Hemoglobin A1C No results for input(s): HGBA1C in the last 72 hours. Fasting Lipid Panel No results for input(s): CHOL, HDL, LDLCALC, TRIG, CHOLHDL, LDLDIRECT in the last 72 hours. Thyroid Function Tests No results for input(s): TSH, T4TOTAL, T3FREE, THYROIDAB in the last 72 hours.  Invalid input(s): FREET3 _____________  Dg Chest Portable 1 View  Result Date: 03/25/2019 CLINICAL DATA:  Code STEMI EXAM: PORTABLE CHEST 1 VIEW COMPARISON:  04/22/2011 FINDINGS: The heart size and mediastinal contours are within normal limits. Both lungs are clear. The visualized skeletal structures are unremarkable. IMPRESSION: No active disease. Electronically Signed   By: Donavan Foil M.D.   On: 03/25/2019 22:46   Disposition   Patient was seen and examined by Dr. Aundra Dubin who deemed patient as stable for discharge. Follow-up has been arranged.  Discharge medications as listed below.   Follow-up Plans & Appointments    Follow-up Information    Lennette BihariKelly, Thomas A, MD Follow up.   Specialty: Cardiology Why: Someone from Dr. Landry DykeKelly's office will contact you to schedule a post-hospital follow-up visit. If you do not hear from them within 1 week, please contact our office to follow-up.  Contact information: [address] [place] [id number] [phone]          Discharge Instructions    Amb Referral to Cardiac Rehabilitation   Complete by: As directed    Diagnosis:  STEMI Coronary Stents     After initial evaluation and assessments completed: Virtual Based Care may be provided alone or in conjunction with Phase 2 Cardiac Rehab based on patient barriers.: Yes   Diet - low sodium heart healthy   Complete by: As directed    Increase activity slowly   Complete by: As directed        Discharge Medications   Allergies as of 03/28/2019   No Known Allergies     Medication List    TAKE these medications   acetaminophen 325 MG tablet Commonly known as: TYLENOL Take 325-650 mg by mouth every 8 (eight) hours as needed for mild pain, moderate pain or headache.   aspirin 81 MG chewable tablet Chew 1 tablet (81 mg total) by mouth daily. Stop after 30 days.   atorvastatin 80 MG tablet Commonly known as: LIPITOR Take 1 tablet (80 mg total) by mouth daily at 6 PM. What changed:   medication strength  how much to take  when to take this   cetirizine 10 MG tablet Commonly known as: ZYRTEC Take 10 mg by mouth daily as needed (for seasonal allergies).   clopidogrel 75 MG tablet Commonly known as: PLAVIX Take 1 tablet (75 mg total) by mouth daily.   levothyroxine 100 MCG tablet Commonly known as: SYNTHROID Take 100 mcg by mouth at bedtime.   metoprolol succinate 25 MG 24 hr tablet Commonly known as: TOPROL-XL Take 0.5 tablets (12.5 mg total) by mouth daily.   nitroGLYCERIN 0.4 MG SL tablet Commonly known as: Nitrostat Place 1 tablet (0.4 mg total) under the tongue every 5 (five) minutes as needed for chest pain.   Rivaroxaban 15 MG Tabs tablet Commonly known as: XARELTO Take 1 tablet (15 mg total) by mouth daily with supper. What changed:   medication strength  how much to take  when to take this  Another medication with the same name was removed. Continue taking this medication, and follow the directions you see here.        Aspirin prescribed at discharge?  Yes High Intensity Statin Prescribed? (Lipitor 40-80mg  or Crestor 20-40mg ): Yes Beta Blocker Prescribed? Yes For EF <40%, was ACEI/ARB Prescribed? No: BP soft - to add outpatient ADP Receptor Inhibitor Prescribed? (i.e. Plavix etc.-Includes Medically Managed Patients): Yes For EF <40%, Aldosterone Inhibitor Prescribed? No: EF >40% Was EF assessed during THIS hospitalization? Yes Was  Cardiac Rehab II ordered? (Included Medically managed Patients): Yes   Outstanding Labs/Studies   None  Duration of Discharge Encounter   Greater than 30 minutes including physician time.  Signed, Beatriz StallionKrista M.  PA-C 03/29/2019, 8:15 AM

## 2019-03-28 NOTE — Progress Notes (Signed)
Sheath removed at 2200. Pressure held for 30 minutes. Level 1 at sheath site. Patient educated on s/s of complications.

## 2019-03-30 ENCOUNTER — Encounter (HOSPITAL_COMMUNITY): Payer: Self-pay | Admitting: Cardiovascular Disease

## 2019-03-31 ENCOUNTER — Telehealth (HOSPITAL_COMMUNITY): Payer: Self-pay

## 2019-03-31 NOTE — Telephone Encounter (Signed)
Dr.  Claiborne Billings  As you are aware our department remains closed to patients due to Covid-19. We are excited to be able to offer an alternative to traditional onsite Cardiac Rehab while your patient continues to follow re-open guidelines. This is a notification that your patient has been contacted and is very interested in participating in Virtual Cardiac Rehab.  Thank you for your continued support in helping [place] meet the health care needs of our patients.  [facility]  Cardiac Rehab staff

## 2019-04-08 ENCOUNTER — Telehealth: Payer: Self-pay | Admitting: Hematology & Oncology

## 2019-04-08 NOTE — Telephone Encounter (Signed)
sw patient to confirm appt 7/22 at 315 pm per 7/8 sch msg. Pt will call office back to give [employer]

## 2019-04-13 ENCOUNTER — Telehealth (HOSPITAL_COMMUNITY): Payer: Self-pay

## 2019-04-13 ENCOUNTER — Encounter: Payer: Self-pay | Admitting: Physician Assistant

## 2019-04-13 ENCOUNTER — Other Ambulatory Visit: Payer: Self-pay

## 2019-04-13 ENCOUNTER — Ambulatory Visit (INDEPENDENT_AMBULATORY_CARE_PROVIDER_SITE_OTHER): Payer: BC Managed Care – PPO | Admitting: Physician Assistant

## 2019-04-13 VITALS — BP 121/70 | HR 42 | Ht 71.0 in | Wt 230.0 lb

## 2019-04-13 DIAGNOSIS — E785 Hyperlipidemia, unspecified: Secondary | ICD-10-CM | POA: Diagnosis not present

## 2019-04-13 DIAGNOSIS — I2510 Atherosclerotic heart disease of native coronary artery without angina pectoris: Secondary | ICD-10-CM | POA: Diagnosis not present

## 2019-04-13 DIAGNOSIS — D6851 Activated protein C resistance: Secondary | ICD-10-CM

## 2019-04-13 DIAGNOSIS — R001 Bradycardia, unspecified: Secondary | ICD-10-CM

## 2019-04-13 MED ORDER — LISINOPRIL 2.5 MG PO TABS: 2.5000 mg | ORAL_TABLET | Freq: Every day | ORAL | 3 refills | Status: DC

## 2019-04-13 NOTE — Telephone Encounter (Signed)
Pt insurance is active and benefits verified through [place]. Co-pay $0.00, DED $3,000.00/$3,000.00 met, out of pocket $7,000.00/$7,000.00 met, co-insurance 20%. No pre-authorization required. [employer]port, 04/13/2019 @ 9:31AM, HQI#[id number]

## 2019-04-13 NOTE — Telephone Encounter (Signed)
Recv'd phone call from pt and stated he had his follow up appt today. Pt insurance has been updated, will verify insurance and pass to [employer]. for review.

## 2019-04-13 NOTE — Progress Notes (Signed)
Cardiology Office Note    Date:  04/14/2019   ID:  Randy Cannon, DOB March 21, 1976, MRN [id number]  PCP:  Sigmund Hazel, MD  Cardiologist:  Dr. Tresa Endo  Chief Complaint  Patient presents with  . Follow-up    seen for Dr. Tresa Endo    History of Present Illness:  Randy Cannon is a 43 y.o. male with PMH of hypothyroidism, factor V Leiden heterozygosity and prior DVT (2006 and 2012) with IVC filter who recently admitted to the hospital on 03/25/2019 with inferoposterior STEMI.  High-sensitivity troponin 8288.  He has been on Xarelto long-term.  Emergent cardiac catheterization revealed multivessel CAD with occluded large RCA requiring PTCA, extensive thrombectomy and ultimately insertion of 4.0 x 22 mm resolute Onyx DES postdilated to 4.4 mm.  There was also a very complex 80% trifurcation stenosis in the distal RCA at the takeoff of PDA branch, total occlusion of mid left circumflex artery with distal collateral from LAD artery, 70% stenosis in proximal ramus, 90% stenosis in proximal diagonal.  Echocardiogram performed on 03/26/2019 showed EF 50 to 55%, inferior hypokinesis.  Although his EF is preserved, there was evidence of RV infarction with moderate RV dysfunction.  He ultimately underwent successful staged PCI on 6/26 with stent placement in the diagonal artery, stent placement in distal RCA into PDA 2, balloon angioplasty to PDA 1 vessel.  Postprocedure, patient was placed on aspirin and Plavix, Xarelto dose was increased from 10 mg to 15 mg while on the Plavix.  It is planned to later increase Xarelto to 20 mg after Plavix is stopped.  It is also recommended to continue triple therapy including aspirin, Plavix and Xarelto for 1 month then stop aspirin after the first month.  Lipid panel obtained during this admission showed borderline elevated LDL, LDL goal is less than 70.  He was placed on high-dose statin.  Patient was seen for post hospital follow-up today.  He denies any recent chest  discomfort or shortness of breath.  He has no bleeding issue.  We plan to stop the aspirin on 7/26.  Otherwise he has been compliant with aspirin, Plavix and the Xarelto.  His heart rate was 42 today based on EKG.  He denies any dizziness or presyncope recently.  I will discontinue metoprolol succinate and start him on lisinopril 2.5 mg daily.  He is due for fasting lipid panel and LFT in 6 to 8 weeks.  Otherwise I encouraged him to start on cardiac rehab and increase activity level.  EKG continue to show T wave inversion in inferior leads with Q waves as well.  Overall I think he is doing well from the cardiology perspective.  He does not have any heart failure symptoms including lower extremity edema, orthopnea or PND.  He can follow-up with Dr. Tresa Endo in 3 months.   Past Medical History:  Diagnosis Date  . DVT (deep venous thrombosis) (HCC)   . DVT of proximal leg (deep vein thrombosis) (HCC) 09/07/2011  . Factor V Leiden mutation (HCC) 09/07/2011  . Hyperlipidemia   . Thyroid disease     Past Surgical History:  Procedure Laterality Date  . CORONARY STENT INTERVENTION N/A 03/27/2019   Procedure: CORONARY STENT INTERVENTION;  Surgeon: Lennette Bihari, MD;  Location: [facility] INVASIVE CV LAB;  Service: Cardiovascular;  Laterality: N/A;  . CORONARY/GRAFT ACUTE MI REVASCULARIZATION N/A 03/25/2019   Procedure: Coronary/Graft Acute MI Revascularization;  Surgeon: Lennette Bihari, MD;  Location: [facility] INVASIVE CV LAB;  Service: Cardiovascular;  Laterality: N/A;  . LEFT HEART CATH AND CORONARY ANGIOGRAPHY N/A 03/25/2019   Procedure: LEFT HEART CATH AND CORONARY ANGIOGRAPHY;  Surgeon: Troy Sine, MD;  Location: [facility];  Service: Cardiovascular;  Laterality: N/A;    Current Medications: Outpatient Medications Prior to Visit  Medication Sig Dispense Refill  . acetaminophen (TYLENOL) 325 MG tablet Take 325-650 mg by mouth every 8 (eight) hours as needed for mild pain, moderate pain or headache.      Marland Kitchen aspirin 81 MG chewable tablet Chew 1 tablet (81 mg total) by mouth daily. Stop after 30 days. 30 tablet 0  . atorvastatin (LIPITOR) 80 MG tablet Take 1 tablet (80 mg total) by mouth daily at 6 PM. 90 tablet 3  . cetirizine (ZYRTEC) 10 MG tablet Take 10 mg by mouth daily as needed (for seasonal allergies).     . clopidogrel (PLAVIX) 75 MG tablet Take 1 tablet (75 mg total) by mouth daily. 90 tablet 3  . levothyroxine (SYNTHROID) 100 MCG tablet Take 100 mcg by mouth at bedtime.     . nitroGLYCERIN (NITROSTAT) 0.4 MG SL tablet Place 1 tablet (0.4 mg total) under the tongue every 5 (five) minutes as needed for chest pain. 25 tablet 3  . Rivaroxaban (XARELTO) 15 MG TABS tablet Take 1 tablet (15 mg total) by mouth daily with supper. 90 tablet 3  . metoprolol succinate (TOPROL-XL) 25 MG 24 hr tablet Take 0.5 tablets (12.5 mg total) by mouth daily. 45 tablet 3   No facility-administered medications prior to visit.      Allergies:   Patient has no known allergies.   Social History   Socioeconomic History  . Marital status: Married    Spouse name: Not on file  . Number of children: Not on file  . Years of education: Not on file  . Highest education level: Not on file  Occupational History  . Not on file  Social Needs  . Financial resource strain: Not hard at all  . Food insecurity    Worry: Never true    Inability: Never true  . Transportation needs    Medical: No    Non-medical: No  Tobacco Use  . Smoking status: Never Smoker  . Smokeless tobacco: Current User  Substance and Sexual Activity  . Alcohol use: Yes  . Drug use: Not Currently  . Sexual activity: Not on file  Lifestyle  . Physical activity    Days per week: Not on file    Minutes per session: Not on file  . Stress: Not on file  Relationships  . Social Herbalist on phone: Not on file    Gets together: Not on file    Attends religious service: Not on file    Active member of club or organization: Not on  file    Attends meetings of clubs or organizations: Not on file    Relationship status: Not on file  Other Topics Concern  . Not on file  Social History Narrative  . Not on file     Family History:  The patient's family history includes Factor V Leiden deficiency in his father; Heart attack (age of onset: 70) in his paternal grandfather; Hyperlipidemia in his father.   ROS:   Please see the history of present illness.    ROS All other systems reviewed and are negative.   PHYSICAL EXAM:   VS:  BP 121/70   Pulse (!) 42   Ht 5'  11" (1.803 m)   Wt 230 lb (104.3 kg)   BMI 32.08 kg/m    GEN: Well nourished, well developed, in no acute distress  HEENT: normal  Neck: no JVD, carotid bruits, or masses Cardiac: RRR; no murmurs, rubs, or gallops,no edema  Respiratory:  clear to auscultation bilaterally, normal work of breathing GI: soft, nontender, nondistended, + BS MS: no deformity or atrophy  Skin: warm and dry, no rash Neuro:  Alert and Oriented x 3, Strength and sensation are intact Psych: euthymic mood, full affect  Wt Readings from Last 3 Encounters:  04/13/19 230 lb (104.3 kg)  03/26/19 233 lb 11 oz (106 kg)  05/27/13 223 lb (101.2 kg)      Studies/Labs Reviewed:   EKG:  EKG is ordered today.  The ekg ordered today demonstrates sinus bradycardia, inferior infarct  Recent Labs: 03/25/2019: ALT 51 03/28/2019: BUN 8; Creatinine, Ser 0.82; Hemoglobin 13.6; Platelets 199; Potassium 3.7; Sodium 138   Lipid Panel    Component Value Date/Time   CHOL 165 03/25/2019 2217   TRIG 99 03/25/2019 2217   HDL 39 (L) 03/25/2019 2217   CHOLHDL 4.2 03/25/2019 2217   VLDL 20 03/25/2019 2217   LDLCALC 106 (H) 03/25/2019 2217    Additional studies/ records that were reviewed today include:   Cath 03/25/2019  1st RPL lesion is 80% stenosed.  RPDA lesion is 80% stenosed.  Dist RCA lesion is 80% stenosed.  Prox RCA lesion is 100% stenosed.  Ramus lesion is 70% stenosed.  1st  Diag lesion is 90% stenosed.  Mid Cx to Dist Cx lesion is 100% stenosed.  There is mild left ventricular systolic dysfunction.  LV end diastolic pressure is normal.  The left ventricular ejection fraction is 50-55% by visual estimate.  3rd RPL-1 lesion is 60% stenosed.  3rd RPL-2 lesion is 100% stenosed.  A drug-eluting stent was successfully placed.  Post intervention, there is a 0% residual stenosis.   Acute inferior ST segment elevation myocardial infarction secondary to total occlusion of a very large dominant RCA proximally with extensive thrombus burden.  Multivessel CAD with percent ostial tapering of a long left main; 90% proximal stenosis in a very proximal diagonal branch of the LAD with a normal LAD extending to the apex; 70% proximal stenosis in the ramus intermediate vessel; total occlusion of the mid left circumflex coronary artery with retrograde filling of the distal obtuse marginal branch from LAD collaterals; and total proximal occlusion of a very large dominant RCA with extensive thrombus burden there is some faint collateralization to the PDA a and PLA vessels from the left coronary circulation.  Difficult but successful PCI to the large RCA requiring PTCA, extensive thrombectomy, Aggrastat with reperfusion induced hypotension and bradycardia necessitating significant fluid resuscitation, atropine and Levophed with ultimate insertion of a 4.0 x 22 mm Resolute Onyx DES stent postdilated to 4.4 mm with the 100% occlusion being reduced to 0% and resumption of brisk TIMI-3 flow.  There is a very complex 80% trifurcation stenosis in the distal RCA at the takeoff of the PDA large inferior LV branch and continuation branch with thrombus in the mid PLA just collateralized from the left circulation.  Mild acute LV dysfunction with inferobasal hypocontractility.  RECOMMENDATION: The patient left the catheterization laboratory on Cangrelor which will be discontinued in the CCU  with subsequent administration of Plavix 600 mg daily.  Patient will be maintained on Aggrastat for at least 24 hours and possibly until Friday a.m. prior to subsequent  planned intervention to the complex trifurcation distal LAD stenosis and possibly the diagonal and ramus vessels.  Will ask colleagues to review. At present Xarelto will be held but ultimately he will need to be on triple drug therapy with aspirin/Plavix and Xarelto for 1 month and then Plavix/Xarelto.  Plan echo Doppler in a.m.  Titrate to maximum statin dose.  With the patient's history of significant hyperlipidemia at a young age consider possible familial hyperlipidemia and if LDL cannot reach target would initiate PCSK9 inhibition.   Cath 03/27/2019  1st Diag lesion is 90% stenosed.  Mid Cx to Dist Cx lesion is 100% stenosed.  1st RPL lesion is 80% stenosed.  RPDA lesion is 80% stenosed.  Dist RCA lesion is 80% stenosed.  Ramus lesion is 70% stenosed.  Post intervention, there is a 10% residual stenosis.  Previously placed Prox RCA-2 drug eluting stent is widely patent.  Balloon angioplasty was performed.  Prox RCA-1 lesion is 20% stenosed.  Post intervention, there is a 0% residual stenosis.  Post intervention, there is a 0% residual stenosis.  Post intervention, there is a 0% residual stenosis.  Post intervention, there is a 0% residual stenosis.  3rd RPL-1 lesion is 80% stenosed.  Post intervention, there is a 50% residual stenosis.  3rd RPL-2 lesion is 80% stenosed.  A stent was successfully placed.  A stent was successfully placed.  A stent was successfully placed.   Successful multi lesion intervention evolving a trifurcation stenosis of the distal RCA, ostial PDA1, ostial PDA2 and ostial PLA vessel with Cutting Balloon intervention at all sites and ultimate stenting of the distal RCA into the ostium of the PDA2 with a 3.0 x 12 mm Resolute Onyx stent with the 80% stenoses being reduced to 0%;  Cutting Balloon to the PDA 1 with the 80% stenosis reduced to 5%.  Following cutting balloon to the PLA vessel the ostial lesion was reduced to less than 5% but following stenting into the PDA 2 vessel there was some ostial narrowing of the PLA vessel 50 to 60%.  Repeat noncompliant dilatation in the proximal portion of the previously placed 4.0 x 22 mm Resolute stent which was additionally postdilated today to 4.56 mm with improvement in the mild digital narrowing to 0%.  Successful PCI/DES stenting of the diagonal vessel with insertion of a 2.5 x 15 mm Resolute stent postdilated to 2.75 mm with the 80% stenosis being reduced to 0%.  RECOMMENDATION; The patient will be restarted on Xarelto tomorrow and for the first month continue aspirin/Plavix and Xarelto with discontinuance of aspirin after 1 month.  Adequate therapy for the patient's totally occluded left circumflex vessel with left to left collaterals as well as the ramus intermediate vessel.  65 to 70% stenosed.  Aggressive lipid-lowering therapy with target LDL less than 70 and if he cannot achieve this would recommend PCSK9 inhibition for aggressive treatment.   ASSESSMENT:    1. Coronary artery disease involving native coronary artery of native heart without angina pectoris   2. Hyperlipidemia, unspecified hyperlipidemia type   3. Factor V Leiden mutation (HCC)   4. Bradycardia      PLAN:  In order of problems listed above:  1. CAD: Continue triple therapy include aspirin, Plavix and the Xarelto.  Aspirin will be discontinued after 7/26.  He denies any further chest pain since recent stent placement.  2. Bradycardia: Stop beta-blocker given asymptomatic bradycardia with heart rate in the 40s.  Will add lisinopril given RV infarction.  3.  Factor V Leiden: History of DVT and IVC filter placement.  Lifelong anticoagulation therapy  4. Hyperlipidemia: Continue Lipitor 80 mg daily.  Fasting lipid panel and LFT in 6 to 8 weeks    Medication Adjustments/Labs and Tests Ordered: Current medicines are reviewed at length with the patient today.  Concerns regarding medicines are outlined above.  Medication changes, Labs and Tests ordered today are listed in the Patient Instructions below. Patient Instructions  Medication Instructions:   START LISINOPRIL 2.5 MG DAILY  STOP METOPROLOL TODAY  STOP ASPRIN ON 04/26/2019  If you need a refill on your cardiac medications before your next appointment, please call your pharmacy.   Lab work: You will need to come into the office in 6-8 weeks to have labs (blood work) drawn:  Fast Lipid  Liver function   If you have labs (blood work) drawn today and your tests are completely normal, you will receive your results only by: Marland Kitchen. MyChart Message (if you have MyChart) OR . A paper copy in the mail If you have any lab test that is abnormal or we need to change your treatment, we will call you to review the results.  Testing/Procedures:  NONE ordered at this time of appointment   Follow-Up: At [facility], you and your health needs are our priority.  As part of our continuing mission to provide you with exceptional heart care, we have created designated Provider Care Teams.  These Care Teams include your primary Cardiologist (physician) and Advanced Practice Providers (APPs -  Physician Assistants and Nurse Practitioners) who all work together to provide you with the care you need, when you need it. You will need a follow up appointment in 3 months.  Please call our office 2 months in advance to schedule this appointment.  You may see Nicki Guadalajarahomas Kelly, MD or one of the following Advanced Practice Providers on your designated Care Team: [place] Ayah Cozzolino, [place]PA-C . Micah FlesherAngela Duke, PA-C  Any Other Special Instructions Will Be Listed Below (If Applicable).       Ramond DialSigned, Shonia Skilling, [place]PA  04/14/2019 10:56 PM    [facility] [address], [place], [place]  [address] Phone:  [phone]) [phone]; Fax: [phone]

## 2019-04-13 NOTE — Patient Instructions (Addendum)
Medication Instructions:   START LISINOPRIL 2.5 MG DAILY  STOP METOPROLOL TODAY  STOP ASPRIN ON 04/26/2019  If you need a refill on your cardiac medications before your next appointment, please call your pharmacy.   Lab work: You will need to come into the office in 6-8 weeks to have labs (blood work) drawn:  Fast Lipid  Liver function   If you have labs (blood work) drawn today and your tests are completely normal, you will receive your results only by: Marland Kitchen MyChart Message (if you have MyChart) OR . A paper copy in the mail If you have any lab test that is abnormal or we need to change your treatment, we will call you to review the results.  Testing/Procedures:  NONE ordered at this time of appointment   Follow-Up: At [facility], you and your health needs are our priority.  As part of our continuing mission to provide you with exceptional heart care, we have created designated Provider Care Teams.  These Care Teams include your primary Cardiologist (physician) and Advanced Practice Providers (APPs -  Physician Assistants and Nurse Practitioners) who all work together to provide you with the care you need, when you need it. You will need a follow up appointment in 3 months.  Please call our office 2 months in advance to schedule this appointment.  You may see Shelva Majestic, MD or one of the following Advanced Practice Providers on your designated Care Team: [place], [place] . Fabian Sharp, PA-C  Any Other Special Instructions Will Be Listed Below (If Applicable).

## 2019-04-14 ENCOUNTER — Telehealth (HOSPITAL_COMMUNITY): Payer: Self-pay

## 2019-04-14 ENCOUNTER — Encounter: Payer: Self-pay | Admitting: Physician Assistant

## 2019-04-15 ENCOUNTER — Telehealth (HOSPITAL_COMMUNITY): Payer: Self-pay | Admitting: Pharmacist

## 2019-04-15 NOTE — Telephone Encounter (Signed)
Cardiac Rehab Medication Review by a Pharmacist  Does the patient  feel that his/her medications are working for him/her? Yes to the cetirizine working for allergies. Pt cannot tell if other medications are working.    Has the patient been experiencing any side effects to the medications prescribed?  no  Does the patient measure his/her own blood pressure or blood glucose at home?  no   Does the patient have any problems obtaining medications due to transportation or finances?   no  Understanding of regimen: excellent Understanding of indications: excellent Potential of compliance: excellent    Thank you,   Eddie Candle, PharmD PGY-1 Pharmacy Resident  Phone: [phone]

## 2019-04-22 ENCOUNTER — Encounter: Payer: Self-pay | Admitting: Hematology & Oncology

## 2019-04-22 ENCOUNTER — Other Ambulatory Visit: Payer: Self-pay

## 2019-04-22 ENCOUNTER — Telehealth (HOSPITAL_COMMUNITY): Payer: Self-pay | Admitting: *Deleted

## 2019-04-22 ENCOUNTER — Inpatient Hospital Stay: Payer: BC Managed Care – PPO

## 2019-04-22 ENCOUNTER — Inpatient Hospital Stay: Payer: BC Managed Care – PPO | Attending: Hematology & Oncology | Admitting: Hematology & Oncology

## 2019-04-22 VITALS — BP 113/70 | HR 58 | Temp 98.4°F | Resp 20 | Wt 230.0 lb

## 2019-04-22 DIAGNOSIS — I219 Acute myocardial infarction, unspecified: Secondary | ICD-10-CM | POA: Diagnosis not present

## 2019-04-22 DIAGNOSIS — Z7982 Long term (current) use of aspirin: Secondary | ICD-10-CM

## 2019-04-22 DIAGNOSIS — I2510 Atherosclerotic heart disease of native coronary artery without angina pectoris: Secondary | ICD-10-CM | POA: Diagnosis not present

## 2019-04-22 DIAGNOSIS — E785 Hyperlipidemia, unspecified: Secondary | ICD-10-CM | POA: Diagnosis not present

## 2019-04-22 DIAGNOSIS — Z79899 Other long term (current) drug therapy: Secondary | ICD-10-CM | POA: Diagnosis not present

## 2019-04-22 DIAGNOSIS — Z7902 Long term (current) use of antithrombotics/antiplatelets: Secondary | ICD-10-CM | POA: Insufficient documentation

## 2019-04-22 DIAGNOSIS — Z7901 Long term (current) use of anticoagulants: Secondary | ICD-10-CM | POA: Diagnosis not present

## 2019-04-22 DIAGNOSIS — I25110 Atherosclerotic heart disease of native coronary artery with unstable angina pectoris: Secondary | ICD-10-CM

## 2019-04-22 DIAGNOSIS — Z791 Long term (current) use of non-steroidal anti-inflammatories (NSAID): Secondary | ICD-10-CM

## 2019-04-22 DIAGNOSIS — R5383 Other fatigue: Secondary | ICD-10-CM

## 2019-04-22 DIAGNOSIS — D6851 Activated protein C resistance: Secondary | ICD-10-CM

## 2019-04-22 LAB — CMP (CANCER CENTER ONLY)
ALT: 37 U/L (ref 0–44)
AST: 20 U/L (ref 15–41)
Albumin: 4.8 g/dL (ref 3.5–5.0)
Alkaline Phosphatase: 76 U/L (ref 38–126)
Anion gap: 8 (ref 5–15)
BUN: 15 mg/dL (ref 6–20)
CO2: 28 mmol/L (ref 22–32)
Calcium: 9.2 mg/dL (ref 8.9–10.3)
Chloride: 103 mmol/L (ref 98–111)
Creatinine: 1.06 mg/dL (ref 0.61–1.24)
GFR, Est AFR Am: >60 mL/min (ref >60)
GFR, Estimated: >60 mL/min (ref >60)
Glucose, Bld: 93 mg/dL (ref 70–99)
Potassium: 4 mmol/L (ref 3.5–5.1)
Sodium: 139 mmol/L (ref 135–145)
Total Bilirubin: 0.5 mg/dL (ref 0.3–1.2)
Total Protein: 7 g/dL (ref 6.5–8.1)

## 2019-04-22 LAB — CBC WITH DIFFERENTIAL (CANCER CENTER ONLY)
Abs Immature Granulocytes: 0.01 10*3/uL (ref 0.00–0.07)
Basophils Absolute: 0.1 10*3/uL (ref 0.0–0.1)
Basophils Relative: 1 %
Eosinophils Absolute: 0.1 10*3/uL (ref 0.0–0.5)
Eosinophils Relative: 2 %
HCT: 47.6 % (ref 39.0–52.0)
Hemoglobin: 15.7 g/dL (ref 13.0–17.0)
Immature Granulocytes: 0 %
Lymphocytes Relative: 29 %
Lymphs Abs: 1.7 10*3/uL (ref 0.7–4.0)
MCH: 29.1 pg (ref 26.0–34.0)
MCHC: 33 g/dL (ref 30.0–36.0)
MCV: 88.1 fL (ref 80.0–100.0)
Monocytes Absolute: 0.6 10*3/uL (ref 0.1–1.0)
Monocytes Relative: 9 %
Neutro Abs: 3.5 10*3/uL (ref 1.7–7.7)
Neutrophils Relative %: 59 %
Platelet Count: 231 10*3/uL (ref 150–400)
RBC: 5.4 MIL/uL (ref 4.22–5.81)
RDW: 12.3 % (ref 11.5–15.5)
WBC Count: 6 10*3/uL (ref 4.0–10.5)
nRBC: 0 % (ref 0.0–0.2)

## 2019-04-22 NOTE — Telephone Encounter (Signed)
Pt called CR requesting change in appointment for CR orientation d/t lack of childcare.  Pt's daughter was exposed to COVID-19 at daycare.  Appointment rescheduled for 04/30/2019 at 0930.  Pt made aware of new time and how his MyChart will reflect this.  Pt verbalized understanding.  Will f/u with patient next week to ensure that he remains symptom free prior to appointment.

## 2019-04-22 NOTE — Progress Notes (Signed)
Hematology and Oncology Follow Up Visit  Randy Cannon [id number] Oct 16, 1975 43 y.o. 04/22/2019   Principle Diagnosis:   Factor V Leiden mutation-heterozygous  Coronary artery disease-status post myocardial infarctions with stents  Current Therapy:    Xarelto 15 mg p.o. daily  Plavix 75 mg p.o. daily  Aspirin 81 mg p.o. daily     Interim History:  Randy Cannon is back for a very, very long awaited visit.  We have not seen him in the office probably for about 8 years.  We last saw him back in 2012.  At that time, he was on maintenance Xarelto.  I think that for some reason, he was maybe taken off the Xarelto.  He was mountain biking back about a month ago.  He had a myocardial infarction.  It was amazing that he did not die.  He was removed from the woods by EMS.  He was taken to [facility] and had a emergent cardiac cath.  The had significant coronary artery disease.  He had a thrombus aspirated out of 1 of the coronary arteries.  He had stents placed.  He was placed on Plavix and aspirin.  He got him back onto Xarelto.  He is on 15 mg daily.  He was hospitalized only for a couple days.  He is doing well right now.  He is working from home.  Is not yet started cardiac rehab.  I got to mention that he also is now on Lipitor.  His cholesterol/HDL ratio was 4.2.  He feels good.  He has had no chest wall pain.  He does have some slight fatigue.  There is been no sweats.  He has had no nausea or vomiting.  He has had no change in bowel or bladder habits.  He has had no bleeding.  Overall, house his performance status is ECOG 0.  Medications:  Current Outpatient Medications:    acetaminophen (TYLENOL) 325 MG tablet, Take 325-650 mg by mouth every 8 (eight) hours as needed for mild pain, moderate pain or headache. , Disp: , Rfl:    aspirin 81 MG chewable tablet, Chew 1 tablet (81 mg total) by mouth daily. Stop after 30 days., Disp: 30 tablet, Rfl: 0   atorvastatin  (LIPITOR) 80 MG tablet, Take 1 tablet (80 mg total) by mouth daily at 6 PM., Disp: 90 tablet, Rfl: 3   cetirizine (ZYRTEC) 10 MG tablet, Take 10 mg by mouth daily as needed (for seasonal allergies). , Disp: , Rfl:    clopidogrel (PLAVIX) 75 MG tablet, Take 1 tablet (75 mg total) by mouth daily., Disp: 90 tablet, Rfl: 3   levothyroxine (SYNTHROID) 100 MCG tablet, Take 100 mcg by mouth at bedtime. , Disp: , Rfl:    lisinopril (ZESTRIL) 2.5 MG tablet, Take 1 tablet (2.5 mg total) by mouth daily., Disp: 90 tablet, Rfl: 3   Rivaroxaban (XARELTO) 15 MG TABS tablet, Take 1 tablet (15 mg total) by mouth daily with supper., Disp: 90 tablet, Rfl: 3   nitroGLYCERIN (NITROSTAT) 0.4 MG SL tablet, Place 1 tablet (0.4 mg total) under the tongue every 5 (five) minutes as needed for chest pain. (Patient not taking: Reported on 04/22/2019), Disp: 25 tablet, Rfl: 3  Allergies: No Known Allergies  Past Medical History, Surgical history, Social history, and Family History were reviewed and updated.  Review of Systems: Review of Systems  Constitutional: Positive for fatigue.  HENT:  Negative.   Eyes: Negative.   Respiratory: Negative.   Cardiovascular:  Negative.   Gastrointestinal: Negative.   Endocrine: Negative.   Genitourinary: Negative.    Musculoskeletal: Negative.   Skin: Negative.   Neurological: Negative.   Hematological: Negative.   Psychiatric/Behavioral: Negative.     Physical Exam:  weight is 230 lb (104.3 kg). His oral temperature is 98.4 F (36.9 C). His blood pressure is 113/70 and his pulse is 58 (abnormal). His respiration is 20 and oxygen saturation is 99%.   Wt Readings from Last 3 Encounters:  04/22/19 230 lb (104.3 kg)  04/13/19 230 lb (104.3 kg)  03/26/19 233 lb 11 oz (106 kg)    Physical Exam Vitals signs reviewed.  HENT:     Head: Normocephalic and atraumatic.  Eyes:     Pupils: Pupils are equal, round, and reactive to light.  Neck:     Musculoskeletal: Normal  range of motion.  Cardiovascular:     Rate and Rhythm: Normal rate and regular rhythm.     Heart sounds: Normal heart sounds.  Pulmonary:     Effort: Pulmonary effort is normal.     Breath sounds: Normal breath sounds.  Abdominal:     General: Bowel sounds are normal.     Palpations: Abdomen is soft.  Musculoskeletal: Normal range of motion.        General: No tenderness or deformity.  Lymphadenopathy:     Cervical: No cervical adenopathy.  Skin:    General: Skin is warm and dry.     Findings: No erythema or rash.  Neurological:     Mental Status: He is alert and oriented to person, place, and time.  Psychiatric:        Behavior: Behavior normal.        Thought Content: Thought content normal.        Judgment: Judgment normal.      Lab Results  Component Value Date   WBC 6.0 04/22/2019   HGB 15.7 04/22/2019   HCT 47.6 04/22/2019   MCV 88.1 04/22/2019   PLT 231 04/22/2019     Chemistry      Component Value Date/Time   NA 139 04/22/2019 1527   K 4.0 04/22/2019 1527   CL 103 04/22/2019 1527   CO2 28 04/22/2019 1527   BUN 15 04/22/2019 1527   CREATININE 1.06 04/22/2019 1527      Component Value Date/Time   CALCIUM 9.2 04/22/2019 1527   ALKPHOS 76 04/22/2019 1527   AST 20 04/22/2019 1527   ALT 37 04/22/2019 1527   BILITOT 0.5 04/22/2019 1527       Impression and Plan: Randy Cannon is a 43 year old white male.  He has a history of factor V Leiden mutation.  He had been on Xarelto.  We have not seen him for about 8 years.  He now presents with a myocardial infarction and coronary artery blockage.  He clearly is going to need lifelong Xarelto my opinion.  I suspect he probably will also need antiplatelet agents.  We will have to follow him routinely now.  I probably would plan to get him back to see me in about 4 months.  I am just very happy that he looks as good as it looks.  He has always been in great shape.     Volanda Napoleon, MD 7/22/20205:15 PM

## 2019-04-22 NOTE — Telephone Encounter (Signed)
Called patient to complete a health assessment in preparation for orientation and a walk test to cardiac rehab on 04/23/2019 @ 10:00 am.

## 2019-04-23 ENCOUNTER — Other Ambulatory Visit: Payer: Self-pay

## 2019-04-23 ENCOUNTER — Ambulatory Visit (HOSPITAL_COMMUNITY): Payer: BC Managed Care – PPO

## 2019-04-23 ENCOUNTER — Telehealth (HOSPITAL_COMMUNITY): Payer: Self-pay | Admitting: Pharmacist

## 2019-04-23 DIAGNOSIS — R6889 Other general symptoms and signs: Secondary | ICD-10-CM | POA: Diagnosis not present

## 2019-04-23 DIAGNOSIS — Z20822 Contact with and (suspected) exposure to covid-19: Secondary | ICD-10-CM

## 2019-04-24 NOTE — Telephone Encounter (Signed)
Medication History Complete. Please see telephone encounter from 04/15/19.  Thanks,  Lorel Monaco, PharmD PGY1 Ambulatory Care Resident [employer] # [phone]

## 2019-04-26 LAB — NOVEL CORONAVIRUS, NAA: SARS-CoV-2, NAA: NOT DETECTED

## 2019-04-27 ENCOUNTER — Encounter (HOSPITAL_COMMUNITY): Payer: BC Managed Care – PPO

## 2019-04-29 ENCOUNTER — Encounter (HOSPITAL_COMMUNITY): Payer: BC Managed Care – PPO

## 2019-04-30 ENCOUNTER — Encounter (HOSPITAL_COMMUNITY): Payer: Self-pay

## 2019-04-30 ENCOUNTER — Encounter (HOSPITAL_COMMUNITY)
Admission: RE | Admit: 2019-04-30 | Discharge: 2019-04-30 | Disposition: A | Discharge status: Home / Self Care | Payer: BC Managed Care – PPO | Source: Ambulatory Visit | Attending: Cardiovascular Disease | Admitting: Cardiovascular Disease

## 2019-04-30 ENCOUNTER — Other Ambulatory Visit: Payer: Self-pay

## 2019-04-30 VITALS — BP 118/70 | HR 53 | Temp 97.2°F | Ht 71.0 in | Wt 233.5 lb

## 2019-04-30 DIAGNOSIS — Z955 Presence of coronary angioplasty implant and graft: Secondary | ICD-10-CM

## 2019-04-30 DIAGNOSIS — I2111 ST elevation (STEMI) myocardial infarction involving right coronary artery: Secondary | ICD-10-CM | POA: Insufficient documentation

## 2019-04-30 HISTORY — DX: Atherosclerotic heart disease of native coronary artery without angina pectoris: I25.10

## 2019-04-30 NOTE — Progress Notes (Signed)
Cardiac Individual Treatment Plan  Patient Details  Name: Randy Cannon MRN: [id number] Date of Birth: April 09, 1976 Referring Provider:     CARDIAC REHAB PHASE II [facility]ATION [facility]  Referring Provider  Dr. Tresa EndoKelly      Initial Encounter Date:    CARDIAC REHAB PHASE II [facility]ATION [facility]  Date  04/30/19      Visit Diagnosis: 03/25/19 S/P STEMI DES RCA  Patient's Home Medications on Admission:  Current Outpatient Medications:  .  acetaminophen (TYLENOL) 325 MG tablet, Take 325-650 mg by mouth every 8 (eight) hours as needed for mild pain, moderate pain or headache. , Disp: , Rfl:  .  atorvastatin (LIPITOR) 80 MG tablet, Take 1 tablet (80 mg total) by mouth daily at 6 PM., Disp: 90 tablet, Rfl: 3 .  cetirizine (ZYRTEC) 10 MG tablet, Take 10 mg by mouth daily as needed (for seasonal allergies). , Disp: , Rfl:  .  clopidogrel (PLAVIX) 75 MG tablet, Take 1 tablet (75 mg total) by mouth daily., Disp: 90 tablet, Rfl: 3 .  levothyroxine (SYNTHROID) 100 MCG tablet, Take 100 mcg by mouth at bedtime. , Disp: , Rfl:  .  lisinopril (ZESTRIL) 2.5 MG tablet, Take 1 tablet (2.5 mg total) by mouth daily., Disp: 90 tablet, Rfl: 3 .  nitroGLYCERIN (NITROSTAT) 0.4 MG SL tablet, Place 1 tablet (0.4 mg total) under the tongue every 5 (five) minutes as needed for chest pain., Disp: 25 tablet, Rfl: 3 .  Rivaroxaban (XARELTO) 15 MG TABS tablet, Take 1 tablet (15 mg total) by mouth daily with supper., Disp: 90 tablet, Rfl: 3  Past Medical History: Past Medical History:  Diagnosis Date  . Coronary artery disease   . DVT (deep venous thrombosis) (HCC)   . DVT of proximal leg (deep vein thrombosis) (HCC) 09/07/2011  . Factor V Leiden mutation (HCC) 09/07/2011  . Hyperlipidemia   . Thyroid disease     Tobacco Use: Social History   Tobacco Use  Smoking Status Former Smoker  . Quit date: 2014  .  Years since quitting: 6.5  Smokeless Tobacco Current User    Labs: Recent Review Flowsheet Data    Labs for ITP Cardiac and Pulmonary Rehab Latest Ref Rng & Units 04/18/2011 04/21/2011 03/25/2019   Cholestrol 0 - 200 mg/dL - - 914165   LDLCALC 0 - 99 mg/dL - - 782(N106(H)   HDL >56>40 mg/dL - - 21(H39(L)   Trlycerides <150 mg/dL - - 99   Hemoglobin Y8MA1c <5.7 % - 5.2 -   PHART 7.350 - 7.450 - - 7.363   PCO2ART 32.0 - 48.0 mmHg - - 37.0   HCO3 20.0 - 28.0 mmol/L - - 21.1   TCO2 22 - 32 mmol/L 24 - 22   ACIDBASEDEF 0.0 - 2.0 mmol/L - - 4.0(H)   O2SAT % - - 96.0      Capillary Blood Glucose: Lab Results  Component Value Date   GLUCAP 108 (H) 04/20/2011     Exercise Target Goals: Exercise Program Goal: Individual exercise prescription set using results from initial 6 min walk test and THRR while considering  patient's activity barriers and safety.   Exercise Prescription Goal: Starting with aerobic activity 30 plus minutes a day, 3 days per week for initial exercise prescription. Provide home exercise prescription and guidelines that participant acknowledges understanding prior to discharge.  Activity Barriers & Risk Stratification: Activity Barriers & Cardiac Risk Stratification -  04/30/19 1042      Activity Barriers & Cardiac Risk Stratification   Activity Barriers  None    Cardiac Risk Stratification  High       6 Minute Walk: 6 Minute Walk    Row Name 04/30/19 1041         6 Minute Walk   Phase  Initial     Distance  1800 feet     Walk Time  6 minutes     # of Rest Breaks  0     MPH  3.4     METS  4.8     RPE  9     Perceived Dyspnea   0     VO2 Peak  16.81     Symptoms  No     Resting HR  53 bpm     Resting BP  118/70     Resting Oxygen Saturation   99 %     Exercise Oxygen Saturation  during 6 min walk  98 %     Max Ex. HR  91 bpm     Max Ex. BP  126/78     2 Minute Post BP  108/64        Oxygen Initial Assessment:   Oxygen Re-Evaluation:   Oxygen Discharge  (Final Oxygen Re-Evaluation):   Initial Exercise Prescription: Initial Exercise Prescription - 04/30/19 1000      Date of Initial Exercise RX and Referring Provider   Date  04/30/19    Referring Provider  Dr. Claiborne Billings    Expected Discharge Date  06/12/19      Treadmill   MPH  3.8    Grade  1    Minutes  15      Recumbant Bike   Level  6    Watts  94.4    Minutes  15    METs  5.5      Prescription Details   Frequency (times per week)  3    Duration  Progress to 30 minutes of continuous aerobic without signs/symptoms of physical distress      Intensity   THRR 40-80% of Max Heartrate  70-141    Ratings of Perceived Exertion  11-13      Progression   Progression  Continue to progress workloads to maintain intensity without signs/symptoms of physical distress.      Resistance Training   Training Prescription  Yes    Weight  7 lbs.     Reps  10-15       Perform Capillary Blood Glucose checks as needed.  Exercise Prescription Changes:   Exercise Comments:   Exercise Goals and Review:  Exercise Goals    Row Name 04/30/19 1043             Exercise Goals   Increase Physical Activity  Yes       Intervention  Provide advice, education, support and counseling about physical activity/exercise needs.;Develop an individualized exercise prescription for aerobic and resistive training based on initial evaluation findings, risk stratification, comorbidities and participant's personal goals.       Expected Outcomes  Short Term: Attend rehab on a regular basis to increase amount of physical activity.;Long Term: Add in home exercise to make exercise part of routine and to increase amount of physical activity.;Long Term: Exercising regularly at least 3-5 days a week.       Increase Strength and Stamina  Yes       Intervention  Provide advice,  education, support and counseling about physical activity/exercise needs.;Develop an individualized exercise prescription for aerobic and  resistive training based on initial evaluation findings, risk stratification, comorbidities and participant's personal goals.       Expected Outcomes  Short Term: Increase workloads from initial exercise prescription for resistance, speed, and METs.;Short Term: Perform resistance training exercises routinely during rehab and add in resistance training at home;Long Term: Improve cardiorespiratory fitness, muscular endurance and strength as measured by increased METs and functional capacity (6MWT)       Able to understand and use rate of perceived exertion (RPE) scale  Yes       Intervention  Provide education and explanation on how to use RPE scale       Expected Outcomes  Short Term: Able to use RPE daily in rehab to express subjective intensity level;Long Term:  Able to use RPE to guide intensity level when exercising independently       Knowledge and understanding of Target Heart Rate Range (THRR)  Yes       Intervention  Provide education and explanation of THRR including how the numbers were predicted and where they are located for reference       Expected Outcomes  Short Term: Able to state/look up THRR;Long Term: Able to use THRR to govern intensity when exercising independently;Short Term: Able to use daily as guideline for intensity in rehab       Able to check pulse independently  Yes       Intervention  Provide education and demonstration on how to check pulse in carotid and radial arteries.;Review the importance of being able to check your own pulse for safety during independent exercise       Expected Outcomes  Short Term: Able to explain why pulse checking is important during independent exercise;Long Term: Able to check pulse independently and accurately       Understanding of Exercise Prescription  Yes       Intervention  Provide education, explanation, and written materials on patient's individual exercise prescription       Expected Outcomes  Short Term: Able to explain program exercise  prescription;Long Term: Able to explain home exercise prescription to exercise independently          Exercise Goals Re-Evaluation :    Discharge Exercise Prescription (Final Exercise Prescription Changes):   Nutrition:  Target Goals: Understanding of nutrition guidelines, daily intake of sodium 1500mg , cholesterol 200mg , calories 30% from fat and 7% or less from saturated fats, daily to have 5 or more servings of fruits and vegetables.  Biometrics: Pre Biometrics - 04/30/19 1044      Pre Biometrics   Height  5\' 11"  (1.803 m)    Weight  105.9 kg    Waist Circumference  40 inches    Hip Circumference  43.5 inches    Waist to Hip Ratio  0.92 %    BMI (Calculated)  32.58    Triceps Skinfold  21 mm    % Body Fat  29.5 %    Grip Strength  71 kg    Flexibility  18 in    Single Leg Stand  30 seconds        Nutrition Therapy Plan and Nutrition Goals:   Nutrition Assessments:   Nutrition Goals Re-Evaluation:   Nutrition Goals Discharge (Final Nutrition Goals Re-Evaluation):   Psychosocial: Target Goals: Acknowledge presence or absence of significant depression and/or stress, maximize coping skills, provide positive support system. Participant is able to verbalize  types and ability to use techniques and skills needed for reducing stress and depression.  Initial Review & Psychosocial Screening: Initial Psych Review & Screening - 04/30/19 1022      Initial Review   Current issues with  None Identified      Family Dynamics   Good Support System?  Yes   Shaunn has his wife and 20 year old daughter for support     Barriers   Psychosocial barriers to participate in program  There are no identifiable barriers or psychosocial needs.      Screening Interventions   Interventions  Encouraged to exercise       Quality of Life Scores: Quality of Life - 04/30/19 1049      Quality of Life   Select  Quality of Life      Quality of Life Scores   Health/Function Pre  27.9  %    Socioeconomic Pre  26.64 %    Psych/Spiritual Pre  28.07 %    Family Pre  30 %    GLOBAL Pre  27.99 %      Scores of 19 and below usually indicate a poorer quality of life in these areas.  A difference of  2-3 points is a clinically meaningful difference.  A difference of 2-3 points in the total score of the Quality of Life Index has been associated with significant improvement in overall quality of life, self-image, physical symptoms, and general health in studies assessing change in quality of life.  PHQ-9: Recent Review Flowsheet Data    Depression screen PHQ 2/9 04/30/2019   Down, Depressed, Hopeless 0   PHQ - 2 Score 0     Interpretation of Total Score  Total Score Depression Severity:  1-4 = Minimal depression, 5-9 = Mild depression, 10-14 = Moderate depression, 15-19 = Moderately severe depression, 20-27 = Severe depression   Psychosocial Evaluation and Intervention:   Psychosocial Re-Evaluation:   Psychosocial Discharge (Final Psychosocial Re-Evaluation):   Vocational Rehabilitation: Provide vocational rehab assistance to qualifying candidates.   Vocational Rehab Evaluation & Intervention: Vocational Rehab - 04/30/19 1135      Initial Vocational Rehab Evaluation & Intervention   Assessment shows need for Vocational Rehabilitation  No       Education: Education Goals: Education classes will be provided on a weekly basis, covering required topics. Participant will state understanding/return demonstration of topics presented.  Learning Barriers/Preferences: Learning Barriers/Preferences - 04/30/19 1052      Learning Barriers/Preferences   Learning Barriers  None    Learning Preferences  Audio;Computer/Internet;Group Instruction;Individual Instruction;Pictoral;Skilled Demonstration;Verbal Instruction;Video;Written Material       Education Topics: Hypertension, Hypertension Reduction -Define heart disease and high blood pressure. Discus how high blood  pressure affects the body and ways to reduce high blood pressure.   Exercise and Your Heart -Discuss why it is important to exercise, the FITT principles of exercise, normal and abnormal responses to exercise, and how to exercise safely.   Angina -Discuss definition of angina, causes of angina, treatment of angina, and how to decrease risk of having angina.   Cardiac Medications -Review what the following cardiac medications are used for, how they affect the body, and side effects that may occur when taking the medications.  Medications include Aspirin, Beta blockers, calcium channel blockers, ACE Inhibitors, angiotensin receptor blockers, diuretics, digoxin, and antihyperlipidemics.   Congestive Heart Failure -Discuss the definition of CHF, how to live with CHF, the signs and symptoms of CHF, and how keep track of  weight and sodium intake.   Heart Disease and Intimacy -Discus the effect sexual activity has on the heart, how changes occur during intimacy as we age, and safety during sexual activity.   Smoking Cessation / COPD -Discuss different methods to quit smoking, the health benefits of quitting smoking, and the definition of COPD.   Nutrition I: Fats -Discuss the types of cholesterol, what cholesterol does to the heart, and how cholesterol levels can be controlled.   Nutrition II: Labels -Discuss the different components of food labels and how to read food label   Heart Parts/Heart Disease and PAD -Discuss the anatomy of the heart, the pathway of blood circulation through the heart, and these are affected by heart disease.   Stress I: Signs and Symptoms -Discuss the causes of stress, how stress may lead to anxiety and depression, and ways to limit stress.   Stress II: Relaxation -Discuss different types of relaxation techniques to limit stress.   Warning Signs of Stroke / TIA -Discuss definition of a stroke, what the signs and symptoms are of a stroke, and how to  identify when someone is having stroke.   Knowledge Questionnaire Score: Knowledge Questionnaire Score - 04/30/19 1046      Knowledge Questionnaire Score   Pre Score  22/24       Core Components/Risk Factors/Patient Goals at Admission: Personal Goals and Risk Factors at Admission - 04/30/19 1054      Core Components/Risk Factors/Patient Goals on Admission    Weight Management  Yes;Weight Maintenance    Admit Weight  233 lb 7.5 oz (105.9 kg)    Expected Outcomes  Short Term: Continue to assess and modify interventions until short term weight is achieved;Long Term: Adherence to nutrition and physical activity/exercise program aimed toward attainment of established weight goal;Weight Maintenance: Understanding of the daily nutrition guidelines, which includes 25-35% calories from fat, 7% or less cal from saturated fats, less than 200mg  cholesterol, less than 1.5gm of sodium, & 5 or more servings of fruits and vegetables daily;Understanding recommendations for meals to include 15-35% energy as protein, 25-35% energy from fat, 35-60% energy from carbohydrates, less than 200mg  of dietary cholesterol, 20-35 gm of total fiber daily;Understanding of distribution of calorie intake throughout the day with the consumption of 4-5 meals/snacks    Lipids  Yes    Intervention  Provide education and support for participant on nutrition & aerobic/resistive exercise along with prescribed medications to achieve LDL 70mg , HDL >40mg .    Expected Outcomes  Short Term: Participant states understanding of desired cholesterol values and is compliant with medications prescribed. Participant is following exercise prescription and nutrition guidelines.;Long Term: Cholesterol controlled with medications as prescribed, with individualized exercise RX and with personalized nutrition plan. Value goals: LDL < 70mg , HDL > 40 mg.       Core Components/Risk Factors/Patient Goals Review:    Core Components/Risk  Factors/Patient Goals at Discharge (Final Review):    ITP Comments: ITP Comments    Row Name 04/30/19 0958           ITP Comments  Dr. Armanda Magicraci Turner, Medical Director          Comments: Patient attended orientation on 04/30/2019 to review rules and guidelines for program.  Completed 6 minute walk test, Intitial ITP, and exercise prescription.  VSS. Telemetry-Sinus Brady, Sinus Rhythm.  Asymptomatic. Safety measures and social distancing in place per CDC guidelines.Gladstone LighterMaria Garreth Burnsworth, RN,BSN 04/30/2019 11:51 AM

## 2019-05-01 ENCOUNTER — Encounter (HOSPITAL_COMMUNITY): Payer: BC Managed Care – PPO

## 2019-05-04 ENCOUNTER — Encounter (HOSPITAL_COMMUNITY)
Admission: RE | Admit: 2019-05-04 | Discharge: 2019-05-04 | Disposition: A | Discharge status: Home / Self Care | Payer: BC Managed Care – PPO | Source: Ambulatory Visit | Attending: Cardiovascular Disease | Admitting: Cardiovascular Disease

## 2019-05-04 ENCOUNTER — Other Ambulatory Visit: Payer: Self-pay

## 2019-05-04 DIAGNOSIS — Z955 Presence of coronary angioplasty implant and graft: Secondary | ICD-10-CM | POA: Insufficient documentation

## 2019-05-04 DIAGNOSIS — I2111 ST elevation (STEMI) myocardial infarction involving right coronary artery: Secondary | ICD-10-CM

## 2019-05-04 NOTE — Progress Notes (Signed)
Daily Session Note  Patient Details  Name: Randy Cannon MRN: [id number] Date of Birth: 1976/07/28 Referring Provider:     [place] from 04/30/2019 in [place]  Referring Provider  Dr. Claiborne Billings      Encounter Date: 05/04/2019  Check In: Session Check In - 05/04/19 1323      Check-In   Supervising physician immediately available to respond to emergencies  [facility] immediately available    Physician(s)  Dr. [place]    Location  [facility]    Staff Present  Maurice Small, RN, Maxcine Ham, RN, Toma Deiters, RN, Deland Pretty, MS, ACSM CEP, Exercise Physiologist    Virtual Visit  No    Medication changes reported      No    Fall or balance concerns reported     No    Tobacco Cessation  No Change    Warm-up and Cool-down  Performed on first and last piece of equipment    Resistance Training Performed  Yes    VAD Patient?  No    PAD/SET Patient?  No      Pain Assessment   Currently in Pain?  No/denies       Capillary Blood Glucose: No results found for this or any previous visit (from the past 24 hour(s)).  Exercise Prescription Changes - 05/04/19 1321      Response to Exercise   Blood Pressure (Admit)  122/62    Blood Pressure (Exercise)  164/80    Blood Pressure (Exit)  122/78    Heart Rate (Admit)  80 bpm    Heart Rate (Exercise)  141 bpm    Heart Rate (Exit)  80 bpm    Symptoms  none    Duration  Progress to 30 minutes of  aerobic without signs/symptoms of physical distress    Intensity  THRR unchanged      Progression   Progression  Continue to progress workloads to maintain intensity without signs/symptoms of physical distress.    Average METs  6.3      Resistance Training   Training Prescription  Yes    Weight  7 lbs.     Reps  10-15    Time  10 Minutes      Interval Training   Interval Training  No      Treadmill   MPH  3.8    Grade  1    Minutes  15     METs  4.43      Bike   Level  6   Upright [employer] bike   Minutes  10    METs  8.1      Recumbant Bike   Level  --    Watts  --    Minutes  --    METs  --       Social History   Tobacco Use  Smoking Status Former Smoker  . Quit date: 2014  . Years since quitting: 6.5  Smokeless Tobacco Current User    Goals Met:  Exercise tolerated well  Goals Unmet:  Not Applicable  Comments: Pt started cardiac rehab today.  Pt tolerated light exercise without difficulty. VSS, telemetry-SR, asymptomatic.  Medication list reconciled. Pt denies barriers to medicaiton compliance.  PSYCHOSOCIAL ASSESSMENT:  PHQ-0. Pt exhibits positive coping skills, hopeful outlook with supportive family. No psychosocial needs identified at this time, no psychosocial interventions necessary.  Pt oriented to exercise equipment and routine.  Understanding verbalized.    Dr. Traci Turner is Medical Director for Cardiac Rehab at [facility]. 

## 2019-05-06 ENCOUNTER — Other Ambulatory Visit: Payer: Self-pay

## 2019-05-06 ENCOUNTER — Encounter (HOSPITAL_COMMUNITY)
Admission: RE | Admit: 2019-05-06 | Discharge: 2019-05-06 | Disposition: A | Discharge status: Home / Self Care | Payer: BC Managed Care – PPO | Source: Ambulatory Visit | Attending: Cardiovascular Disease | Admitting: Cardiovascular Disease

## 2019-05-06 DIAGNOSIS — I2111 ST elevation (STEMI) myocardial infarction involving right coronary artery: Secondary | ICD-10-CM

## 2019-05-06 DIAGNOSIS — Z955 Presence of coronary angioplasty implant and graft: Secondary | ICD-10-CM | POA: Diagnosis not present

## 2019-05-08 ENCOUNTER — Other Ambulatory Visit: Payer: Self-pay

## 2019-05-08 ENCOUNTER — Encounter (HOSPITAL_COMMUNITY)
Admission: RE | Admit: 2019-05-08 | Discharge: 2019-05-08 | Disposition: A | Discharge status: Home / Self Care | Payer: BC Managed Care – PPO | Source: Ambulatory Visit | Attending: Cardiovascular Disease | Admitting: Cardiovascular Disease

## 2019-05-08 DIAGNOSIS — I2111 ST elevation (STEMI) myocardial infarction involving right coronary artery: Secondary | ICD-10-CM | POA: Diagnosis not present

## 2019-05-08 DIAGNOSIS — Z955 Presence of coronary angioplasty implant and graft: Secondary | ICD-10-CM

## 2019-05-11 ENCOUNTER — Other Ambulatory Visit: Payer: Self-pay

## 2019-05-11 ENCOUNTER — Encounter (HOSPITAL_COMMUNITY)
Admission: RE | Admit: 2019-05-11 | Discharge: 2019-05-11 | Disposition: A | Discharge status: Home / Self Care | Payer: BC Managed Care – PPO | Source: Ambulatory Visit | Attending: Cardiovascular Disease | Admitting: Cardiovascular Disease

## 2019-05-11 DIAGNOSIS — I2111 ST elevation (STEMI) myocardial infarction involving right coronary artery: Secondary | ICD-10-CM | POA: Diagnosis not present

## 2019-05-11 DIAGNOSIS — Z955 Presence of coronary angioplasty implant and graft: Secondary | ICD-10-CM

## 2019-05-13 ENCOUNTER — Encounter (HOSPITAL_COMMUNITY)
Admission: RE | Admit: 2019-05-13 | Discharge: 2019-05-13 | Disposition: A | Discharge status: Home / Self Care | Payer: BC Managed Care – PPO | Source: Ambulatory Visit | Attending: Cardiovascular Disease | Admitting: Cardiovascular Disease

## 2019-05-13 ENCOUNTER — Other Ambulatory Visit: Payer: Self-pay

## 2019-05-13 DIAGNOSIS — Z955 Presence of coronary angioplasty implant and graft: Secondary | ICD-10-CM | POA: Diagnosis not present

## 2019-05-13 DIAGNOSIS — I2111 ST elevation (STEMI) myocardial infarction involving right coronary artery: Secondary | ICD-10-CM | POA: Diagnosis not present

## 2019-05-15 ENCOUNTER — Encounter (HOSPITAL_COMMUNITY): Payer: BC Managed Care – PPO

## 2019-05-18 ENCOUNTER — Encounter (HOSPITAL_COMMUNITY): Payer: BC Managed Care – PPO

## 2019-05-20 ENCOUNTER — Encounter (HOSPITAL_COMMUNITY)
Admission: RE | Admit: 2019-05-20 | Discharge: 2019-05-20 | Disposition: A | Discharge status: Home / Self Care | Payer: BC Managed Care – PPO | Source: Ambulatory Visit | Attending: Cardiovascular Disease | Admitting: Cardiovascular Disease

## 2019-05-20 ENCOUNTER — Other Ambulatory Visit: Payer: Self-pay

## 2019-05-20 DIAGNOSIS — Z955 Presence of coronary angioplasty implant and graft: Secondary | ICD-10-CM | POA: Diagnosis not present

## 2019-05-20 DIAGNOSIS — I2111 ST elevation (STEMI) myocardial infarction involving right coronary artery: Secondary | ICD-10-CM | POA: Diagnosis not present

## 2019-05-20 NOTE — Progress Notes (Signed)
I have reviewed a Home Exercise Prescription with Randy Cannon . Randy Cannon is currently exercising at home.  The patient was advised to walk 5-7 days a week for 30-45 minutes.  Randy Cannon and I discussed how to progress their exercise prescription.  The patient stated that their goals were to continue walking 30 minutes per day and to start mountain biking again on easy trails. Encouraged Pt to monitor HR and consult doctor before doing so.  The patient stated that they understand the exercise prescription.  We reviewed exercise guidelines, target heart rate during exercise, RPE Scale, weather conditions, NTG use, endpoints for exercise, warmup and cool down.  Patient is encouraged to come to me with any questions. I will continue to follow up with the patient to assist them with progression and safety.    Randy Cannon BS, ACSM CEP 05/20/2019 4:07 PM

## 2019-05-21 NOTE — Progress Notes (Signed)
Cardiac Individual Treatment Plan  Patient Details  Name: Randy Cannon MRN: [id number] Date of Birth: 1976/04/26 Referring Provider:     CARDIAC REHAB PHASE II ORIENTATION from 04/30/2019 in [facility]  Referring Provider  Dr. Tresa Endo      Initial Encounter Date:    CARDIAC REHAB PHASE II ORIENTATION from 04/30/2019 in [facility] CARDIAC REHAB  Date  04/30/19      Visit Diagnosis: ST elevation myocardial infarction involving right coronary artery (HCC) 03/25/19  Status post coronary artery stent placement 03/25/19  Patient's Home Medications on Admission:  Current Outpatient Medications:  .  acetaminophen (TYLENOL) 325 MG tablet, Take 325-650 mg by mouth every 8 (eight) hours as needed for mild pain, moderate pain or headache. , Disp: , Rfl:  .  atorvastatin (LIPITOR) 80 MG tablet, Take 1 tablet (80 mg total) by mouth daily at 6 PM., Disp: 90 tablet, Rfl: 3 .  cetirizine (ZYRTEC) 10 MG tablet, Take 10 mg by mouth daily as needed (for seasonal allergies). , Disp: , Rfl:  .  clopidogrel (PLAVIX) 75 MG tablet, Take 1 tablet (75 mg total) by mouth daily., Disp: 90 tablet, Rfl: 3 .  levothyroxine (SYNTHROID) 100 MCG tablet, Take 100 mcg by mouth at bedtime. , Disp: , Rfl:  .  lisinopril (ZESTRIL) 2.5 MG tablet, Take 1 tablet (2.5 mg total) by mouth daily., Disp: 90 tablet, Rfl: 3 .  nitroGLYCERIN (NITROSTAT) 0.4 MG SL tablet, Place 1 tablet (0.4 mg total) under the tongue every 5 (five) minutes as needed for chest pain., Disp: 25 tablet, Rfl: 3 .  Rivaroxaban (XARELTO) 15 MG TABS tablet, Take 1 tablet (15 mg total) by mouth daily with supper., Disp: 90 tablet, Rfl: 3  Past Medical History: Past Medical History:  Diagnosis Date  . Coronary artery disease   . DVT (deep venous thrombosis) (HCC)   . DVT of proximal leg (deep vein thrombosis) (HCC) 09/07/2011  . Factor V Leiden mutation (HCC) 09/07/2011  . Hyperlipidemia   . Thyroid disease      Tobacco Use: Social History   Tobacco Use  Smoking Status Former Smoker  . Quit date: 2014  . Years since quitting: 6.6  Smokeless Tobacco Current User    Labs: Recent Review Flowsheet Data    Labs for ITP Cardiac and Pulmonary Rehab Latest Ref Rng & Units 04/18/2011 04/21/2011 03/25/2019   Cholestrol 0 - 200 mg/dL - - 409   LDLCALC 0 - 99 mg/dL - - 811(B)   HDL >14 mg/dL - - 78(G)   Trlycerides <150 mg/dL - - 99   Hemoglobin N5A <5.7 % - 5.2 -   PHART 7.350 - 7.450 - - 7.363   PCO2ART 32.0 - 48.0 mmHg - - 37.0   HCO3 20.0 - 28.0 mmol/L - - 21.1   TCO2 22 - 32 mmol/L 24 - 22   ACIDBASEDEF 0.0 - 2.0 mmol/L - - 4.0(H)   O2SAT % - - 96.0      Capillary Blood Glucose: Lab Results  Component Value Date   GLUCAP 108 (H) 04/20/2011     Exercise Target Goals: Exercise Program Goal: Individual exercise prescription set using results from initial 6 min walk test and THRR while considering  patient's activity barriers and safety.   Exercise Prescription Goal: Starting with aerobic activity 30 plus minutes a day, 3 days per week for initial exercise prescription. Provide home exercise prescription and guidelines that participant acknowledges understanding prior to  discharge.  Activity Barriers & Risk Stratification: Activity Barriers & Cardiac Risk Stratification - 04/30/19 1042      Activity Barriers & Cardiac Risk Stratification   Activity Barriers  None    Cardiac Risk Stratification  High       6 Minute Walk: 6 Minute Walk    Row Name 04/30/19 1041         6 Minute Walk   Phase  Initial     Distance  1800 feet     Walk Time  6 minutes     # of Rest Breaks  0     MPH  3.4     METS  4.8     RPE  9     Perceived Dyspnea   0     VO2 Peak  16.81     Symptoms  No     Resting HR  53 bpm     Resting BP  118/70     Resting Oxygen Saturation   99 %     Exercise Oxygen Saturation  during 6 min walk  98 %     Max Ex. HR  91 bpm     Max Ex. BP  126/78     2  Minute Post BP  108/64        Oxygen Initial Assessment:   Oxygen Re-Evaluation:   Oxygen Discharge (Final Oxygen Re-Evaluation):   Initial Exercise Prescription: Initial Exercise Prescription - 04/30/19 1000      Date of Initial Exercise RX and Referring Provider   Date  04/30/19    Referring Provider  Dr. Tresa Endo    Expected Discharge Date  06/12/19      Treadmill   MPH  3.8    Grade  1    Minutes  15      Recumbant Bike   Level  6    Watts  94.4    Minutes  15    METs  5.5      Prescription Details   Frequency (times per week)  3    Duration  Progress to 30 minutes of continuous aerobic without signs/symptoms of physical distress      Intensity   THRR 40-80% of Max Heartrate  70-141    Ratings of Perceived Exertion  11-13      Progression   Progression  Continue to progress workloads to maintain intensity without signs/symptoms of physical distress.      Resistance Training   Training Prescription  Yes    Weight  7 lbs.     Reps  10-15       Perform Capillary Blood Glucose checks as needed.  Exercise Prescription Changes: Exercise Prescription Changes    Row Name 05/04/19 1321 05/20/19 1500           Response to Exercise   Blood Pressure (Admit)  122/62  110/68      Blood Pressure (Exercise)  164/80  118/74      Blood Pressure (Exit)  122/78  118/74      Heart Rate (Admit)  80 bpm  84 bpm      Heart Rate (Exercise)  141 bpm  132 bpm      Heart Rate (Exit)  80 bpm  84 bpm      Rating of Perceived Exertion (Exercise)  -  12      Symptoms  none  none      Duration  Progress to 30 minutes of  aerobic  without signs/symptoms of physical distress  Progress to 30 minutes of  aerobic without signs/symptoms of physical distress      Intensity  THRR unchanged  THRR unchanged        Progression   Progression  Continue to progress workloads to maintain intensity without signs/symptoms of physical distress.  Continue to progress workloads to maintain  intensity without signs/symptoms of physical distress.      Average METs  6.3  6.6        Resistance Training   Training Prescription  Yes  No      Weight  7 lbs.   -      Reps  10-15  -      Time  10 Minutes  -        Interval Training   Interval Training  No  -        Treadmill   MPH  3.8  3.8      Grade  1  1      Minutes  15  15      METs  4.43  4.43        Bike   Level  6 Upright [employer] bike  6      Minutes  10  15      METs  8.1  8.9        Recumbant Bike   Level  -  -      Watts  -  -      Minutes  -  -      METs  -  -        Home Exercise Plan   Plans to continue exercise at  -  Home (comment)      Frequency  -  Add 3 additional days to program exercise sessions.      Initial Home Exercises Provided  -  05/20/19         Exercise Comments: Exercise Comments    Row Name 05/04/19 1647 05/20/19 1603         Exercise Comments  Patient off to an excellent start with exercise. Progress workloads as tolerated to help achieve personal health and fitness goals.  Reviewed HEP with Pt. Pt understands goals.         Exercise Goals and Review: Exercise Goals    Row Name 04/30/19 1043             Exercise Goals   Increase Physical Activity  Yes       Intervention  Provide advice, education, support and counseling about physical activity/exercise needs.;Develop an individualized exercise prescription for aerobic and resistive training based on initial evaluation findings, risk stratification, comorbidities and participant's personal goals.       Expected Outcomes  Short Term: Attend rehab on a regular basis to increase amount of physical activity.;Long Term: Add in home exercise to make exercise part of routine and to increase amount of physical activity.;Long Term: Exercising regularly at least 3-5 days a week.       Increase Strength and Stamina  Yes       Intervention  Provide advice, education, support and counseling about physical activity/exercise  needs.;Develop an individualized exercise prescription for aerobic and resistive training based on initial evaluation findings, risk stratification, comorbidities and participant's personal goals.       Expected Outcomes  Short Term: Increase workloads from initial exercise prescription for resistance, speed, and METs.;Short Term: Perform resistance training exercises routinely during rehab  and add in resistance training at home;Long Term: Improve cardiorespiratory fitness, muscular endurance and strength as measured by increased METs and functional capacity (6MWT)       Able to understand and use rate of perceived exertion (RPE) scale  Yes       Intervention  Provide education and explanation on how to use RPE scale       Expected Outcomes  Short Term: Able to use RPE daily in rehab to express subjective intensity level;Long Term:  Able to use RPE to guide intensity level when exercising independently       Knowledge and understanding of Target Heart Rate Range (THRR)  Yes       Intervention  Provide education and explanation of THRR including how the numbers were predicted and where they are located for reference       Expected Outcomes  Short Term: Able to state/look up THRR;Long Term: Able to use THRR to govern intensity when exercising independently;Short Term: Able to use daily as guideline for intensity in rehab       Able to check pulse independently  Yes       Intervention  Provide education and demonstration on how to check pulse in carotid and radial arteries.;Review the importance of being able to check your own pulse for safety during independent exercise       Expected Outcomes  Short Term: Able to explain why pulse checking is important during independent exercise;Long Term: Able to check pulse independently and accurately       Understanding of Exercise Prescription  Yes       Intervention  Provide education, explanation, and written materials on patient's individual exercise prescription        Expected Outcomes  Short Term: Able to explain program exercise prescription;Long Term: Able to explain home exercise prescription to exercise independently          Exercise Goals Re-Evaluation : Exercise Goals Re-Evaluation    Row Name 05/04/19 1647 05/20/19 1600           Exercise Goal Re-Evaluation   Exercise Goals Review  Increase Physical Activity;Increase Strength and Stamina;Able to understand and use rate of perceived exertion (RPE) scale;Knowledge and understanding of Target Heart Rate Range (THRR)  Increase Physical Activity;Understanding of Exercise Prescription;Increase Strength and Stamina;Knowledge and understanding of Target Heart Rate Range (THRR);Able to check pulse independently;Able to understand and use rate of perceived exertion (RPE) scale      Comments  Patient able to understand and use RPE scale appropriately. Reviewed THRR with patient.  Reviewed HEP with Pt. Pt understands THRR, RPE scale, end points of exercise, weather precautions, warm up and cool down stretches, and NTG use. Pt stated he is walking 30 minutes daily. Pt also stated he would like to get back to mountain biking. Encouraged Pt to take it slow and moniotr his HR while biking.      Expected Outcomes  Pateint will progress workloads as tolerated to help achieve personal health and fitness goal.  Will continue to monitor and progress Pt as tolerated.          Discharge Exercise Prescription (Final Exercise Prescription Changes): Exercise Prescription Changes - 05/20/19 1500      Response to Exercise   Blood Pressure (Admit)  110/68    Blood Pressure (Exercise)  118/74    Blood Pressure (Exit)  118/74    Heart Rate (Admit)  84 bpm    Heart Rate (Exercise)  132 bpm  Heart Rate (Exit)  84 bpm    Rating of Perceived Exertion (Exercise)  12    Symptoms  none    Duration  Progress to 30 minutes of  aerobic without signs/symptoms of physical distress    Intensity  THRR unchanged       Progression   Progression  Continue to progress workloads to maintain intensity without signs/symptoms of physical distress.    Average METs  6.6      Resistance Training   Training Prescription  No      Treadmill   MPH  3.8    Grade  1    Minutes  15    METs  4.43      Bike   Level  6    Minutes  15    METs  8.9      Home Exercise Plan   Plans to continue exercise at  Home (comment)    Frequency  Add 3 additional days to program exercise sessions.    Initial Home Exercises Provided  05/20/19       Nutrition:  Target Goals: Understanding of nutrition guidelines, daily intake of sodium 1500mg , cholesterol 200mg , calories 30% from fat and 7% or less from saturated fats, daily to have 5 or more servings of fruits and vegetables.  Biometrics: Pre Biometrics - 04/30/19 1044      Pre Biometrics   Height   (1.803 m)    Weight  105.9 kg    Waist Circumference  40 inches    Hip Circumference  43.5 inches    Waist to Hip Ratio  0.92 %    BMI (Calculated)  32.58    Triceps Skinfold  21 mm    % Body Fat  29.5 %    Grip Strength  71 kg    Flexibility  18 in    Single Leg Stand  30 seconds        Nutrition Therapy Plan and Nutrition Goals:   Nutrition Assessments:   Nutrition Goals Re-Evaluation:   Nutrition Goals Discharge (Final Nutrition Goals Re-Evaluation):   Psychosocial: Target Goals: Acknowledge presence or absence of significant depression and/or stress, maximize coping skills, provide positive support system. Participant is able to verbalize types and ability to use techniques and skills needed for reducing stress and depression.  Initial Review & Psychosocial Screening: Initial Psych Review & Screening - 04/30/19 1022      Initial Review   Current issues with  None Identified      Family Dynamics   Good Support System?  Yes   Lavon has his wife and 48 year old daughter for support     Barriers   Psychosocial barriers to participate in  program  There are no identifiable barriers or psychosocial needs.      Screening Interventions   Interventions  Encouraged to exercise       Quality of Life Scores: Quality of Life - 04/30/19 1049      Quality of Life   Select  Quality of Life      Quality of Life Scores   Health/Function Pre  27.9 %    Socioeconomic Pre  26.64 %    Psych/Spiritual Pre  28.07 %    Family Pre  30 %    GLOBAL Pre  27.99 %      Scores of 19 and below usually indicate a poorer quality of life in these areas.  A difference of  2-3 points is a clinically  meaningful difference.  A difference of 2-3 points in the total score of the Quality of Life Index has been associated with significant improvement in overall quality of life, self-image, physical symptoms, and general health in studies assessing change in quality of life.  PHQ-9: Recent Review Flowsheet Data    Depression screen [facility] 2/9 05/04/2019 04/30/2019   Decreased Interest 0 -   Down, Depressed, Hopeless 0 0   PHQ - 2 Score 0 0     Interpretation of Total Score  Total Score Depression Severity:  1-4 = Minimal depression, 5-9 = Mild depression, 10-14 = Moderate depression, 15-19 = Moderately severe depression, 20-27 = Severe depression   Psychosocial Evaluation and Intervention: Psychosocial Evaluation - 05/04/19 1704      Psychosocial Evaluation & Interventions   Interventions  Encouraged to exercise with the program and follow exercise prescription    Comments  No psychosocial needs identified.  Dawon enjoys mountain biking, boating, and spending time with his child.    Expected Outcomes  Nello will maintain a positive outlook with good coping skills.    Continue Psychosocial Services   No Follow up required       Psychosocial Re-Evaluation: Psychosocial Re-Evaluation    [place] Name 05/21/19 1507             Psychosocial Re-Evaluation   Current issues with  None Identified       Interventions  Encouraged to attend Cardiac  Rehabilitation for the exercise       Continue Psychosocial Services   No Follow up required          Psychosocial Discharge (Final Psychosocial Re-Evaluation): Psychosocial Re-Evaluation - 05/21/19 1507      Psychosocial Re-Evaluation   Current issues with  None Identified    Interventions  Encouraged to attend Cardiac Rehabilitation for the exercise    Continue Psychosocial Services   No Follow up required       Vocational Rehabilitation: Provide vocational rehab assistance to qualifying candidates.   Vocational Rehab Evaluation & Intervention: Vocational Rehab - 04/30/19 1135      Initial Vocational Rehab Evaluation & Intervention   Assessment shows need for Vocational Rehabilitation  No       Education: Education Goals: Education classes will be provided on a weekly basis, covering required topics. Participant will state understanding/return demonstration of topics presented.  Learning Barriers/Preferences: Learning Barriers/Preferences - 04/30/19 1052      Learning Barriers/Preferences   Learning Barriers  None    Learning Preferences  Audio;Computer/Internet;Group Instruction;Individual Instruction;Pictoral;Skilled Demonstration;Verbal Instruction;Video;Written Material       Education Topics: Hypertension, Hypertension Reduction -Define heart disease and high blood pressure. Discus how high blood pressure affects the body and ways to reduce high blood pressure.   Exercise and Your Heart -Discuss why it is important to exercise, the FITT principles of exercise, normal and abnormal responses to exercise, and how to exercise safely.   Angina -Discuss definition of angina, causes of angina, treatment of angina, and how to decrease risk of having angina.   Cardiac Medications -Review what the following cardiac medications are used for, how they affect the body, and side effects that may occur when taking the medications.  Medications include Aspirin, Beta  blockers, calcium channel blockers, ACE Inhibitors, angiotensin receptor blockers, diuretics, digoxin, and antihyperlipidemics.   Congestive Heart Failure -Discuss the definition of CHF, how to live with CHF, the signs and symptoms of CHF, and how keep track of weight and sodium intake.   Heart  Disease and Intimacy -Discus the effect sexual activity has on the heart, how changes occur during intimacy as we age, and safety during sexual activity.   Smoking Cessation / COPD -Discuss different methods to quit smoking, the health benefits of quitting smoking, and the definition of COPD.   Nutrition I: Fats -Discuss the types of cholesterol, what cholesterol does to the heart, and how cholesterol levels can be controlled.   Nutrition II: Labels -Discuss the different components of food labels and how to read food label   Heart Parts/Heart Disease and PAD -Discuss the anatomy of the heart, the pathway of blood circulation through the heart, and these are affected by heart disease.   Stress I: Signs and Symptoms -Discuss the causes of stress, how stress may lead to anxiety and depression, and ways to limit stress.   Stress II: Relaxation -Discuss different types of relaxation techniques to limit stress.   Warning Signs of Stroke / TIA -Discuss definition of a stroke, what the signs and symptoms are of a stroke, and how to identify when someone is having stroke.   Knowledge Questionnaire Score: Knowledge Questionnaire Score - 04/30/19 1046      Knowledge Questionnaire Score   Pre Score  22/24       Core Components/Risk Factors/Patient Goals at Admission: Personal Goals and Risk Factors at Admission - 04/30/19 1054      Core Components/Risk Factors/Patient Goals on Admission    Weight Management  Yes;Weight Maintenance    Admit Weight  233 lb 7.5 oz (105.9 kg)    Expected Outcomes  Short Term: Continue to assess and modify interventions until short term weight is  achieved;Long Term: Adherence to nutrition and physical activity/exercise program aimed toward attainment of established weight goal;Weight Maintenance: Understanding of the daily nutrition guidelines, which includes 25-35% calories from fat, 7% or less cal from saturated fats, less than 200mg  cholesterol, less than 1.5gm of sodium, & 5 or more servings of fruits and vegetables daily;Understanding recommendations for meals to include 15-35% energy as protein, 25-35% energy from fat, 35-60% energy from carbohydrates, less than 200mg  of dietary cholesterol, 20-35 gm of total fiber daily;Understanding of distribution of calorie intake throughout the day with the consumption of 4-5 meals/snacks    Lipids  Yes    Intervention  Provide education and support for participant on nutrition & aerobic/resistive exercise along with prescribed medications to achieve LDL 70mg , HDL >40mg .    Expected Outcomes  Short Term: Participant states understanding of desired cholesterol values and is compliant with medications prescribed. Participant is following exercise prescription and nutrition guidelines.;Long Term: Cholesterol controlled with medications as prescribed, with individualized exercise RX and with personalized nutrition plan. Value goals: LDL < 70mg , HDL > 40 mg.       Core Components/Risk Factors/Patient Goals Review:  Goals and Risk Factor Review    Row Name 05/04/19 1705 05/21/19 1508           Core Components/Risk Factors/Patient Goals Review   Personal Goals Review  Weight Management/Obesity;Lipids  Weight Management/Obesity;Lipids      Review  Pt with few CAD RFs willing to participate in CR exercise.  Barbara CowerJason would like to get back to his normal and mountain biking.  Barbara CowerJason has been doing well with exercise. Eliazar's weight has been stable.      Expected Outcomes  Barbara CowerJason will continue to particpate in CR exercise, nutrition, and lifestyle modification opportunities.  Patient will continue to participate  in phase 2 cardiac rehab for exercise,  nutrtion and lifestyle modifications.         Core Components/Risk Factors/Patient Goals at Discharge (Final Review):  Goals and Risk Factor Review - 05/21/19 1508      Core Components/Risk Factors/Patient Goals Review   Personal Goals Review  Weight Management/Obesity;Lipids    Review  Bervin has been doing well with exercise. Gurjit's weight has been stable.    Expected Outcomes  Patient will continue to participate in phase 2 cardiac rehab for exercise, nutrtion and lifestyle modifications.       ITP Comments: ITP Comments    Row Name 04/30/19 0958 05/04/19 1704 05/21/19 1506       ITP Comments  Dr. Armanda Magic, Medical Director  Pt started exercise today and tolerated it well.  30 Day ITP comments. Patient with good attendance and participation in phase 2 cardiac rehab.        Comments: 30 Day ITP Review. See ITP comments.Gladstone Lighter, RN,BSN 05/21/2019 3:14 PM

## 2019-05-22 ENCOUNTER — Telehealth (HOSPITAL_COMMUNITY): Payer: Self-pay | Admitting: Family Medicine

## 2019-05-22 ENCOUNTER — Encounter (HOSPITAL_COMMUNITY): Payer: BC Managed Care – PPO

## 2019-05-25 ENCOUNTER — Encounter (HOSPITAL_COMMUNITY)
Admission: RE | Admit: 2019-05-25 | Discharge: 2019-05-25 | Disposition: A | Discharge status: Home / Self Care | Payer: BC Managed Care – PPO | Source: Ambulatory Visit | Attending: Cardiovascular Disease | Admitting: Cardiovascular Disease

## 2019-05-25 ENCOUNTER — Other Ambulatory Visit: Payer: Self-pay

## 2019-05-25 DIAGNOSIS — Z955 Presence of coronary angioplasty implant and graft: Secondary | ICD-10-CM

## 2019-05-25 DIAGNOSIS — I2111 ST elevation (STEMI) myocardial infarction involving right coronary artery: Secondary | ICD-10-CM

## 2019-05-27 ENCOUNTER — Encounter (HOSPITAL_COMMUNITY)
Admission: RE | Admit: 2019-05-27 | Discharge: 2019-05-27 | Disposition: A | Discharge status: Home / Self Care | Payer: BC Managed Care – PPO | Source: Ambulatory Visit | Attending: Cardiovascular Disease | Admitting: Cardiovascular Disease

## 2019-05-27 ENCOUNTER — Other Ambulatory Visit: Payer: Self-pay

## 2019-05-27 DIAGNOSIS — I2111 ST elevation (STEMI) myocardial infarction involving right coronary artery: Secondary | ICD-10-CM

## 2019-05-27 DIAGNOSIS — Z955 Presence of coronary angioplasty implant and graft: Secondary | ICD-10-CM

## 2019-05-28 DIAGNOSIS — E785 Hyperlipidemia, unspecified: Secondary | ICD-10-CM | POA: Diagnosis not present

## 2019-05-28 LAB — LIPID PANEL
Chol/HDL Ratio: 5.4 ratio — ABNORMAL HIGH (ref 0.0–5.0)
Cholesterol, Total: 174 mg/dL (ref 100–199)
HDL: 32 mg/dL — ABNORMAL LOW (ref >39)
LDL Calculated: 116 mg/dL — ABNORMAL HIGH (ref 0–99)
Triglycerides: 131 mg/dL (ref 0–149)
VLDL Cholesterol Cal: 26 mg/dL (ref 5–40)

## 2019-05-28 LAB — HEPATIC FUNCTION PANEL
ALT: 53 IU/L — ABNORMAL HIGH (ref 0–44)
AST: 26 IU/L (ref 0–40)
Albumin: 4.8 g/dL (ref 4.0–5.0)
Alkaline Phosphatase: 81 IU/L (ref 39–117)
Bilirubin Total: 0.7 mg/dL (ref 0.0–1.2)
Bilirubin, Direct: 0.18 mg/dL (ref 0.00–0.40)
Total Protein: 7.2 g/dL (ref 6.0–8.5)

## 2019-05-29 ENCOUNTER — Other Ambulatory Visit: Payer: Self-pay

## 2019-05-29 ENCOUNTER — Encounter (HOSPITAL_COMMUNITY)
Admission: RE | Admit: 2019-05-29 | Discharge: 2019-05-29 | Disposition: A | Discharge status: Home / Self Care | Payer: BC Managed Care – PPO | Source: Ambulatory Visit | Attending: Cardiovascular Disease | Admitting: Cardiovascular Disease

## 2019-05-29 DIAGNOSIS — E785 Hyperlipidemia, unspecified: Secondary | ICD-10-CM

## 2019-05-29 DIAGNOSIS — I2111 ST elevation (STEMI) myocardial infarction involving right coronary artery: Secondary | ICD-10-CM | POA: Diagnosis not present

## 2019-05-29 DIAGNOSIS — Z955 Presence of coronary angioplasty implant and graft: Secondary | ICD-10-CM

## 2019-05-29 NOTE — Progress Notes (Signed)
Referral placed for Lipid clinic.  

## 2019-06-01 ENCOUNTER — Encounter (HOSPITAL_COMMUNITY)
Admission: RE | Admit: 2019-06-01 | Discharge: 2019-06-01 | Disposition: A | Discharge status: Home / Self Care | Payer: BC Managed Care – PPO | Source: Ambulatory Visit | Attending: Cardiovascular Disease | Admitting: Cardiovascular Disease

## 2019-06-01 ENCOUNTER — Other Ambulatory Visit: Payer: Self-pay

## 2019-06-01 DIAGNOSIS — I2111 ST elevation (STEMI) myocardial infarction involving right coronary artery: Secondary | ICD-10-CM

## 2019-06-01 DIAGNOSIS — Z955 Presence of coronary angioplasty implant and graft: Secondary | ICD-10-CM | POA: Diagnosis not present

## 2019-06-03 ENCOUNTER — Telehealth: Payer: Self-pay | Admitting: Physician Assistant

## 2019-06-03 ENCOUNTER — Other Ambulatory Visit: Payer: Self-pay

## 2019-06-03 ENCOUNTER — Encounter (HOSPITAL_COMMUNITY)
Admission: RE | Admit: 2019-06-03 | Discharge: 2019-06-03 | Disposition: A | Discharge status: Home / Self Care | Payer: BC Managed Care – PPO | Source: Ambulatory Visit | Attending: Cardiovascular Disease | Admitting: Cardiovascular Disease

## 2019-06-03 DIAGNOSIS — Z955 Presence of coronary angioplasty implant and graft: Secondary | ICD-10-CM | POA: Insufficient documentation

## 2019-06-03 DIAGNOSIS — I2111 ST elevation (STEMI) myocardial infarction involving right coronary artery: Secondary | ICD-10-CM | POA: Insufficient documentation

## 2019-06-03 NOTE — Telephone Encounter (Signed)
LVM for patient to call and schedule appointment with the pharmacist.

## 2019-06-05 ENCOUNTER — Encounter (HOSPITAL_COMMUNITY): Payer: BC Managed Care – PPO

## 2019-06-10 ENCOUNTER — Encounter (HOSPITAL_COMMUNITY)
Admission: RE | Admit: 2019-06-10 | Discharge: 2019-06-10 | Disposition: A | Discharge status: Home / Self Care | Payer: BC Managed Care – PPO | Source: Ambulatory Visit | Attending: Cardiovascular Disease | Admitting: Cardiovascular Disease

## 2019-06-10 ENCOUNTER — Other Ambulatory Visit: Payer: Self-pay

## 2019-06-10 DIAGNOSIS — Z955 Presence of coronary angioplasty implant and graft: Secondary | ICD-10-CM | POA: Diagnosis not present

## 2019-06-10 DIAGNOSIS — I2111 ST elevation (STEMI) myocardial infarction involving right coronary artery: Secondary | ICD-10-CM

## 2019-06-12 ENCOUNTER — Encounter (HOSPITAL_COMMUNITY)
Admission: RE | Admit: 2019-06-12 | Discharge: 2019-06-12 | Disposition: A | Discharge status: Home / Self Care | Payer: BC Managed Care – PPO | Source: Ambulatory Visit | Attending: Cardiovascular Disease | Admitting: Cardiovascular Disease

## 2019-06-12 ENCOUNTER — Telehealth (HOSPITAL_COMMUNITY): Payer: Self-pay | Admitting: *Deleted

## 2019-06-12 ENCOUNTER — Other Ambulatory Visit: Payer: Self-pay

## 2019-06-12 DIAGNOSIS — Z955 Presence of coronary angioplasty implant and graft: Secondary | ICD-10-CM | POA: Diagnosis not present

## 2019-06-12 DIAGNOSIS — I2111 ST elevation (STEMI) myocardial infarction involving right coronary artery: Secondary | ICD-10-CM | POA: Diagnosis not present

## 2019-06-15 ENCOUNTER — Encounter (HOSPITAL_COMMUNITY)
Admission: RE | Admit: 2019-06-15 | Discharge: 2019-06-15 | Disposition: A | Discharge status: Home / Self Care | Payer: BC Managed Care – PPO | Source: Ambulatory Visit | Attending: Cardiovascular Disease | Admitting: Cardiovascular Disease

## 2019-06-15 ENCOUNTER — Other Ambulatory Visit: Payer: Self-pay

## 2019-06-15 DIAGNOSIS — I2111 ST elevation (STEMI) myocardial infarction involving right coronary artery: Secondary | ICD-10-CM | POA: Diagnosis not present

## 2019-06-15 DIAGNOSIS — Z955 Presence of coronary angioplasty implant and graft: Secondary | ICD-10-CM | POA: Diagnosis not present

## 2019-06-17 ENCOUNTER — Other Ambulatory Visit: Payer: Self-pay

## 2019-06-17 ENCOUNTER — Encounter (HOSPITAL_COMMUNITY)
Admission: RE | Admit: 2019-06-17 | Discharge: 2019-06-17 | Disposition: A | Discharge status: Home / Self Care | Payer: BC Managed Care – PPO | Source: Ambulatory Visit | Attending: Cardiovascular Disease | Admitting: Cardiovascular Disease

## 2019-06-17 DIAGNOSIS — I2111 ST elevation (STEMI) myocardial infarction involving right coronary artery: Secondary | ICD-10-CM

## 2019-06-17 DIAGNOSIS — Z955 Presence of coronary angioplasty implant and graft: Secondary | ICD-10-CM | POA: Diagnosis not present

## 2019-06-18 NOTE — Progress Notes (Signed)
Cardiac Individual Treatment Plan  Patient Details  Name: Randy Cannon MRN: [id number] Date of Birth: 1976-09-04 Referring Provider:     CARDIAC REHAB PHASE II ORIENTATION from 04/30/2019 in [facility]  Referring Provider  Dr. Claiborne Billings      Initial Encounter Date:    CARDIAC REHAB PHASE II ORIENTATION from 04/30/2019 in [facility]  Date  04/30/19      Visit Diagnosis: ST elevation myocardial infarction involving right coronary artery ([place]) 03/25/19  Status post coronary artery stent placement 03/25/19  Patient's Home Medications on Admission:  Current Outpatient Medications:  .  acetaminophen (TYLENOL) 325 MG tablet, Take 325-650 mg by mouth every 8 (eight) hours as needed for mild pain, moderate pain or headache. , Disp: , Rfl:  .  atorvastatin (LIPITOR) 80 MG tablet, Take 1 tablet (80 mg total) by mouth daily at 6 PM., Disp: 90 tablet, Rfl: 3 .  cetirizine (ZYRTEC) 10 MG tablet, Take 10 mg by mouth daily as needed (for seasonal allergies). , Disp: , Rfl:  .  clopidogrel (PLAVIX) 75 MG tablet, Take 1 tablet (75 mg total) by mouth daily., Disp: 90 tablet, Rfl: 3 .  levothyroxine (SYNTHROID) 100 MCG tablet, Take 100 mcg by mouth at bedtime. , Disp: , Rfl:  .  lisinopril (ZESTRIL) 2.5 MG tablet, Take 1 tablet (2.5 mg total) by mouth daily., Disp: 90 tablet, Rfl: 3 .  nitroGLYCERIN (NITROSTAT) 0.4 MG SL tablet, Place 1 tablet (0.4 mg total) under the tongue every 5 (five) minutes as needed for chest pain., Disp: 25 tablet, Rfl: 3 .  Rivaroxaban (XARELTO) 15 MG TABS tablet, Take 1 tablet (15 mg total) by mouth daily with supper., Disp: 90 tablet, Rfl: 3  Past Medical History: Past Medical History:  Diagnosis Date  . Coronary artery disease   . DVT (deep venous thrombosis) ([place])   . DVT of proximal leg (deep vein thrombosis) ([place]) 09/07/2011  . Factor V Leiden mutation ([place]) 09/07/2011  . Hyperlipidemia   . Thyroid disease      Tobacco Use: Social History   Tobacco Use  Smoking Status Former Smoker  . Quit date: 2014  . Years since quitting: 6.7  Smokeless Tobacco Current User    Labs: Recent Review Flowsheet Data    Labs for ITP Cardiac and Pulmonary Rehab Latest Ref Rng & Units 04/18/2011 04/21/2011 03/25/2019 05/28/2019   Cholestrol 100 - 199 mg/dL - - 165 174   LDLCALC 0 - 99 mg/dL - - 106(H) 116(H)   HDL >39 mg/dL - - 39(L) 32(L)   Trlycerides 0 - 149 mg/dL - - 99 131   Hemoglobin A1c <5.7 % - 5.2 - -   PHART 7.350 - 7.450 - - 7.363 -   PCO2ART 32.0 - 48.0 mmHg - - 37.0 -   HCO3 20.0 - 28.0 mmol/L - - 21.1 -   TCO2 22 - 32 mmol/L 24 - 22 -   ACIDBASEDEF 0.0 - 2.0 mmol/L - - 4.0(H) -   O2SAT % - - 96.0 -      Capillary Blood Glucose: Lab Results  Component Value Date   GLUCAP 108 (H) 04/20/2011     Exercise Target Goals: Exercise Program Goal: Individual exercise prescription set using results from initial 6 min walk test and THRR while considering  patient's activity barriers and safety.   Exercise Prescription Goal: Starting with aerobic activity 30 plus minutes a day, 3 days per week for initial  exercise prescription. Provide home exercise prescription and guidelines that participant acknowledges understanding prior to discharge.  Activity Barriers & Risk Stratification: Activity Barriers & Cardiac Risk Stratification - 04/30/19 1042      Activity Barriers & Cardiac Risk Stratification   Activity Barriers  None    Cardiac Risk Stratification  High       6 Minute Walk: 6 Minute Walk    Row Name 04/30/19 1041         6 Minute Walk   Phase  Initial     Distance  1800 feet     Walk Time  6 minutes     # of Rest Breaks  0     MPH  3.4     METS  4.8     RPE  9     Perceived Dyspnea   0     VO2 Peak  16.81     Symptoms  No     Resting HR  53 bpm     Resting BP  118/70     Resting Oxygen Saturation   99 %     Exercise Oxygen Saturation  during 6 min walk  98 %     Max  Ex. HR  91 bpm     Max Ex. BP  126/78     2 Minute Post BP  108/64        Oxygen Initial Assessment:   Oxygen Re-Evaluation:   Oxygen Discharge (Final Oxygen Re-Evaluation):   Initial Exercise Prescription: Initial Exercise Prescription - 04/30/19 1000      Date of Initial Exercise RX and Referring Provider   Date  04/30/19    Referring Provider  Dr. Claiborne Billings    Expected Discharge Date  06/12/19      Treadmill   MPH  3.8    Grade  1    Minutes  15      Recumbant Bike   Level  6    Watts  94.4    Minutes  15    METs  5.5      Prescription Details   Frequency (times per week)  3    Duration  Progress to 30 minutes of continuous aerobic without signs/symptoms of physical distress      Intensity   THRR 40-80% of Max Heartrate  70-141    Ratings of Perceived Exertion  11-13      Progression   Progression  Continue to progress workloads to maintain intensity without signs/symptoms of physical distress.      Resistance Training   Training Prescription  Yes    Weight  7 lbs.     Reps  10-15       Perform Capillary Blood Glucose checks as needed.  Exercise Prescription Changes: Exercise Prescription Changes    Row Name 05/04/19 1321 05/20/19 1500 06/17/19 1315         Response to Exercise   Blood Pressure (Admit)  122/62  110/68  102/60     Blood Pressure (Exercise)  164/80  118/74  118/74     Blood Pressure (Exit)  122/78  118/74  104/72     Heart Rate (Admit)  80 bpm  84 bpm  68 bpm     Heart Rate (Exercise)  141 bpm  132 bpm  143 bpm     Heart Rate (Exit)  80 bpm  84 bpm  75 bpm     Rating of Perceived Exertion (Exercise)  -  12  12     Symptoms  none  none  none     Duration  Progress to 30 minutes of  aerobic without signs/symptoms of physical distress  Progress to 30 minutes of  aerobic without signs/symptoms of physical distress  Progress to 30 minutes of  aerobic without signs/symptoms of physical distress     Intensity  THRR unchanged  THRR unchanged   THRR unchanged       Progression   Progression  Continue to progress workloads to maintain intensity without signs/symptoms of physical distress.  Continue to progress workloads to maintain intensity without signs/symptoms of physical distress.  Continue to progress workloads to maintain intensity without signs/symptoms of physical distress.     Average METs  6.3  6.6  7       Resistance Training   Training Prescription  Yes  No  No     Weight  7 lbs.   -  -     Reps  10-15  -  -     Time  10 Minutes  -  -       Interval Training   Interval Training  No  -  -       Treadmill   MPH  3.8  3.8  3.8     Grade  _0 Minutes  _1 METs  4.43  4.43  5.48       Bike   Level  6 Upright [employer] bike  6  6     Watts  -  -  135     Minutes  _2 METs  8.1  8.9  8.5       Recumbant Bike   Level  -  -  -     Watts  -  -  -     Minutes  -  -  -     METs  -  -  -       Home Exercise Plan   Plans to continue exercise at  -  Home (comment)  Home (comment)     Frequency  -  Add 3 additional days to program exercise sessions.  Add 3 additional days to program exercise sessions.     Initial Home Exercises Provided  -  05/20/19  05/20/19        Exercise Comments: Exercise Comments    Row Name 05/04/19 1647 05/20/19 1603 06/18/19 1444       Exercise Comments  Patient off to an excellent start with exercise. Progress workloads as tolerated to help achieve personal health and fitness goals.  Reviewed HEP with Pt. Pt understands goals.  Pt is tolerating exercise Rx well and progressing gradually.        Exercise Goals and Review: Exercise Goals    Row Name 04/30/19 1043             Exercise Goals   Increase Physical Activity  Yes       Intervention  Provide advice, education, support and counseling about physical activity/exercise needs.;Develop an individualized exercise prescription for aerobic and resistive training based on initial evaluation findings,  risk stratification, comorbidities and participant's personal goals.       Expected Outcomes  Short Term: Attend rehab on a regular basis to increase amount of physical activity.;Long Term: Add in home exercise to make exercise part  of routine and to increase amount of physical activity.;Long Term: Exercising regularly at least 3-5 days a week.       Increase Strength and Stamina  Yes       Intervention  Provide advice, education, support and counseling about physical activity/exercise needs.;Develop an individualized exercise prescription for aerobic and resistive training based on initial evaluation findings, risk stratification, comorbidities and participant's personal goals.       Expected Outcomes  Short Term: Increase workloads from initial exercise prescription for resistance, speed, and METs.;Short Term: Perform resistance training exercises routinely during rehab and add in resistance training at home;Long Term: Improve cardiorespiratory fitness, muscular endurance and strength as measured by increased METs and functional capacity (6MWT)       Able to understand and use rate of perceived exertion (RPE) scale  Yes       Intervention  Provide education and explanation on how to use RPE scale       Expected Outcomes  Short Term: Able to use RPE daily in rehab to express subjective intensity level;Long Term:  Able to use RPE to guide intensity level when exercising independently       Knowledge and understanding of Target Heart Rate Range (THRR)  Yes       Intervention  Provide education and explanation of THRR including how the numbers were predicted and where they are located for reference       Expected Outcomes  Short Term: Able to state/look up THRR;Long Term: Able to use THRR to govern intensity when exercising independently;Short Term: Able to use daily as guideline for intensity in rehab       Able to check pulse independently  Yes       Intervention  Provide education and demonstration on  how to check pulse in carotid and radial arteries.;Review the importance of being able to check your own pulse for safety during independent exercise       Expected Outcomes  Short Term: Able to explain why pulse checking is important during independent exercise;Long Term: Able to check pulse independently and accurately       Understanding of Exercise Prescription  Yes       Intervention  Provide education, explanation, and written materials on patient's individual exercise prescription       Expected Outcomes  Short Term: Able to explain program exercise prescription;Long Term: Able to explain home exercise prescription to exercise independently          Exercise Goals Re-Evaluation : Exercise Goals Re-Evaluation    [place] Name 05/04/19 1647 05/20/19 1600 06/18/19 1442         Exercise Goal Re-Evaluation   Exercise Goals Review  Increase Physical Activity;Increase Strength and Stamina;Able to understand and use rate of perceived exertion (RPE) scale;Knowledge and understanding of Target Heart Rate Range (THRR)  Increase Physical Activity;Understanding of Exercise Prescription;Increase Strength and Stamina;Knowledge and understanding of Target Heart Rate Range (THRR);Able to check pulse independently;Able to understand and use rate of perceived exertion (RPE) scale  Increase Physical Activity;Increase Strength and Stamina;Able to understand and use rate of perceived exertion (RPE) scale;Knowledge and understanding of Target Heart Rate Range (THRR);Understanding of Exercise Prescription;Able to check pulse independently     Comments  Patient able to understand and use RPE scale appropriately. Reviewed THRR with patient.  Reviewed HEP with Pt. Pt understands THRR, RPE scale, end points of exercise, weather precautions, warm up and cool down stretches, and NTG use. Pt stated he is walking 30 minutes  daily. Pt also stated he would like to get back to mountain biking. Encouraged Pt to take it slow and  moniotr his HR while biking.  Pt is tolerating exercise Rx well and is progressing gradually. Pt is increasing workloads and MET level with each session. MET level is 7.0. Pt is continuing to exercise at home in addition to CR program.     Expected Outcomes  Pateint will progress workloads as tolerated to help achieve personal health and fitness goal.  Will continue to monitor and progress Pt as tolerated.  Will continue to monitor and progress Pt as tolerated.         Discharge Exercise Prescription (Final Exercise Prescription Changes): Exercise Prescription Changes - 06/17/19 1315      Response to Exercise   Blood Pressure (Admit)  102/60    Blood Pressure (Exercise)  118/74    Blood Pressure (Exit)  104/72    Heart Rate (Admit)  68 bpm    Heart Rate (Exercise)  143 bpm    Heart Rate (Exit)  75 bpm    Rating of Perceived Exertion (Exercise)  12    Symptoms  none    Duration  Progress to 30 minutes of  aerobic without signs/symptoms of physical distress    Intensity  THRR unchanged      Progression   Progression  Continue to progress workloads to maintain intensity without signs/symptoms of physical distress.    Average METs  7      Resistance Training   Training Prescription  No      Treadmill   MPH  3.8    Grade  3    Minutes  15    METs  5.48      Bike   Level  6    Watts  135    Minutes  15    METs  8.5      Home Exercise Plan   Plans to continue exercise at  Home (comment)    Frequency  Add 3 additional days to program exercise sessions.    Initial Home Exercises Provided  05/20/19       Nutrition:  Target Goals: Understanding of nutrition guidelines, daily intake of sodium <1591m, cholesterol <2075m calories 30% from fat and 7% or less from saturated fats, daily to have 5 or more servings of fruits and vegetables.  Biometrics: Pre Biometrics - 04/30/19 1044      Pre Biometrics   Height  5' 11" (1.803 m)    Weight  105.9 kg    Waist Circumference  40  inches    Hip Circumference  43.5 inches    Waist to Hip Ratio  0.92 %    BMI (Calculated)  32.58    Triceps Skinfold  21 mm    % Body Fat  29.5 %    Grip Strength  71 kg    Flexibility  18 in    Single Leg Stand  30 seconds        Nutrition Therapy Plan and Nutrition Goals:   Nutrition Assessments:   Nutrition Goals Re-Evaluation:   Nutrition Goals Discharge (Final Nutrition Goals Re-Evaluation):   Psychosocial: Target Goals: Acknowledge presence or absence of significant depression and/or stress, maximize coping skills, provide positive support system. Participant is able to verbalize types and ability to use techniques and skills needed for reducing stress and depression.  Initial Review & Psychosocial Screening: Initial Psych Review & Screening - 04/30/19 1022  Initial Review   Current issues with  None Identified      Family Dynamics   Good Support System?  Yes   Thoams has his wife and 59 year old daughter for support     Barriers   Psychosocial barriers to participate in program  There are no identifiable barriers or psychosocial needs.      Screening Interventions   Interventions  Encouraged to exercise       Quality of Life Scores: Quality of Life - 06/12/19 1446      Quality of Life   Select  Quality of Life      Quality of Life Scores   Health/Function Post  26.83 %    Socioeconomic Post  24.43 %    Psych/Spiritual Post  28.79 %    Family Post  27.3 %    GLOBAL Post  26.81 %      Scores of 19 and below usually indicate a poorer quality of life in these areas.  A difference of  2-3 points is a clinically meaningful difference.  A difference of 2-3 points in the total score of the Quality of Life Index has been associated with significant improvement in overall quality of life, self-image, physical symptoms, and general health in studies assessing change in quality of life.  PHQ-9: Recent Review Flowsheet Data    Depression screen [facility] 2/9  05/04/2019 04/30/2019   Decreased Interest 0 -   Down, Depressed, Hopeless 0 0   PHQ - 2 Score 0 0     Interpretation of Total Score  Total Score Depression Severity:  1-4 = Minimal depression, 5-9 = Mild depression, 10-14 = Moderate depression, 15-19 = Moderately severe depression, 20-27 = Severe depression   Psychosocial Evaluation and Intervention: Psychosocial Evaluation - 05/04/19 1704      Psychosocial Evaluation & Interventions   Interventions  Encouraged to exercise with the program and follow exercise prescription    Comments  No psychosocial needs identified.  Brodie enjoys mountain biking, boating, and spending time with his child.    Expected Outcomes  Markeis will maintain a positive outlook with good coping skills.    Continue Psychosocial Services   No Follow up required       Psychosocial Re-Evaluation: Psychosocial Re-Evaluation    [place] Name 05/21/19 1507 06/18/19 1529           Psychosocial Re-Evaluation   Current issues with  None Identified  None Identified      Interventions  Encouraged to attend Cardiac Rehabilitation for the exercise  Encouraged to attend Cardiac Rehabilitation for the exercise      Continue Psychosocial Services   No Follow up required  No Follow up required         Psychosocial Discharge (Final Psychosocial Re-Evaluation): Psychosocial Re-Evaluation - 06/18/19 1529      Psychosocial Re-Evaluation   Current issues with  None Identified    Interventions  Encouraged to attend Cardiac Rehabilitation for the exercise    Continue Psychosocial Services   No Follow up required       Vocational Rehabilitation: Provide vocational rehab assistance to qualifying candidates.   Vocational Rehab Evaluation & Intervention: Vocational Rehab - 04/30/19 1135      Initial Vocational Rehab Evaluation & Intervention   Assessment shows need for Vocational Rehabilitation  No       Education: Education Goals: Education classes will be provided on a  weekly basis, covering required topics. Participant will state understanding/return demonstration of topics  presented.  Learning Barriers/Preferences: Learning Barriers/Preferences - 04/30/19 1052      Learning Barriers/Preferences   Learning Barriers  None    Learning Preferences  Audio;Computer/Internet;Group Instruction;Individual Instruction;Pictoral;Skilled Demonstration;Verbal Instruction;Video;Written Material       Education Topics: Hypertension, Hypertension Reduction -Define heart disease and high blood pressure. Discus how high blood pressure affects the body and ways to reduce high blood pressure.   Exercise and Your Heart -Discuss why it is important to exercise, the FITT principles of exercise, normal and abnormal responses to exercise, and how to exercise safely.   Angina -Discuss definition of angina, causes of angina, treatment of angina, and how to decrease risk of having angina.   Cardiac Medications -Review what the following cardiac medications are used for, how they affect the body, and side effects that may occur when taking the medications.  Medications include Aspirin, Beta blockers, calcium channel blockers, ACE Inhibitors, angiotensin receptor blockers, diuretics, digoxin, and antihyperlipidemics.   Congestive Heart Failure -Discuss the definition of CHF, how to live with CHF, the signs and symptoms of CHF, and how keep track of weight and sodium intake.   Heart Disease and Intimacy -Discus the effect sexual activity has on the heart, how changes occur during intimacy as we age, and safety during sexual activity.   Smoking Cessation / COPD -Discuss different methods to quit smoking, the health benefits of quitting smoking, and the definition of COPD.   Nutrition I: Fats -Discuss the types of cholesterol, what cholesterol does to the heart, and how cholesterol levels can be controlled.   Nutrition II: Labels -Discuss the different components of  food labels and how to read food label   Heart Parts/Heart Disease and PAD -Discuss the anatomy of the heart, the pathway of blood circulation through the heart, and these are affected by heart disease.   Stress I: Signs and Symptoms -Discuss the causes of stress, how stress may lead to anxiety and depression, and ways to limit stress.   Stress II: Relaxation -Discuss different types of relaxation techniques to limit stress.   Warning Signs of Stroke / TIA -Discuss definition of a stroke, what the signs and symptoms are of a stroke, and how to identify when someone is having stroke.   Knowledge Questionnaire Score: Knowledge Questionnaire Score - 06/12/19 1444      Knowledge Questionnaire Score   Post Score  24/24       Core Components/Risk Factors/Patient Goals at Admission: Personal Goals and Risk Factors at Admission - 04/30/19 1054      Core Components/Risk Factors/Patient Goals on Admission    Weight Management  Yes;Weight Maintenance    Admit Weight  233 lb 7.5 oz (105.9 kg)    Expected Outcomes  Short Term: Continue to assess and modify interventions until short term weight is achieved;Long Term: Adherence to nutrition and physical activity/exercise program aimed toward attainment of established weight goal;Weight Maintenance: Understanding of the daily nutrition guidelines, which includes 25-35% calories from fat, 7% or less cal from saturated fats, less than 274m cholesterol, less than 1.5gm of sodium, & 5 or more servings of fruits and vegetables daily;Understanding recommendations for meals to include 15-35% energy as protein, 25-35% energy from fat, 35-60% energy from carbohydrates, less than 204mof dietary cholesterol, 20-35 gm of total fiber daily;Understanding of distribution of calorie intake throughout the day with the consumption of 4-5 meals/snacks    Lipids  Yes    Intervention  Provide education and support for participant on nutrition & aerobic/resistive  exercise along with prescribed medications to achieve LDL <56m, HDL >474m    Expected Outcomes  Short Term: Participant states understanding of desired cholesterol values and is compliant with medications prescribed. Participant is following exercise prescription and nutrition guidelines.;Long Term: Cholesterol controlled with medications as prescribed, with individualized exercise RX and with personalized nutrition plan. Value goals: LDL < 7064mHDL > 40 mg.       Core Components/Risk Factors/Patient Goals Review:  Goals and Risk Factor Review    Row Name 05/04/19 1705 05/21/19 1508 06/18/19 1529         Core Components/Risk Factors/Patient Goals Review   Personal Goals Review  Weight Management/Obesity;Lipids  Weight Management/Obesity;Lipids  Weight Management/Obesity;Lipids     Review  Pt with few CAD RFs willing to participate in CR exercise.  JasLeelanuld like to get back to his normal and mountain biking.  JasElizars been doing well with exercise. Subhan's weight has been stable.  JasGerhardts been doing well with exercise. JasSufyaans lost 2 kg and is pleased with his progress. JasRogerys he feels stronger.     Expected Outcomes  JasMichoelll continue to particpate in CR exercise, nutrition, and lifestyle modification opportunities.  Patient will continue to participate in phase 2 cardiac rehab for exercise, nutrtion and lifestyle modifications.  Patient will continue to participate in phase 2 cardiac rehab for exercise, nutrtion and lifestyle modifications.        Core Components/Risk Factors/Patient Goals at Discharge (Final Review):  Goals and Risk Factor Review - 06/18/19 1529      Core Components/Risk Factors/Patient Goals Review   Personal Goals Review  Weight Management/Obesity;Lipids    Review  JasRaiders been doing well with exercise. JasDaeshawns lost 2 kg and is pleased with his progress. JasElchananys he feels stronger.    Expected Outcomes  Patient will continue to participate in phase  2 cardiac rehab for exercise, nutrtion and lifestyle modifications.       ITP Comments: ITP Comments    Row Name 04/30/19 0958 05/04/19 1704 05/21/19 1506 06/18/19 1528     ITP Comments  Dr. TraFransico Himedical Director  Pt started exercise today and tolerated it well.  30 Day ITP comments. Patient with good attendance and participation in phase 2 cardiac rehab.  30 Day ITP comments. Patient with good attendance and participation in phase 2 cardiac rehab.       Comments: See ITP comments.MarBarnet PallN,BSN 06/18/2019 3:33 PM

## 2019-06-19 ENCOUNTER — Encounter (HOSPITAL_COMMUNITY)
Admission: RE | Admit: 2019-06-19 | Discharge: 2019-06-19 | Disposition: A | Discharge status: Home / Self Care | Payer: BC Managed Care – PPO | Source: Ambulatory Visit | Attending: Cardiovascular Disease | Admitting: Cardiovascular Disease

## 2019-06-19 ENCOUNTER — Other Ambulatory Visit: Payer: Self-pay

## 2019-06-19 DIAGNOSIS — I2111 ST elevation (STEMI) myocardial infarction involving right coronary artery: Secondary | ICD-10-CM

## 2019-06-19 DIAGNOSIS — Z955 Presence of coronary angioplasty implant and graft: Secondary | ICD-10-CM | POA: Diagnosis not present

## 2019-06-22 ENCOUNTER — Encounter (HOSPITAL_COMMUNITY): Payer: BC Managed Care – PPO

## 2019-06-22 ENCOUNTER — Telehealth (HOSPITAL_COMMUNITY): Payer: Self-pay | Admitting: Family Medicine

## 2019-06-24 ENCOUNTER — Other Ambulatory Visit: Payer: Self-pay

## 2019-06-24 ENCOUNTER — Encounter (HOSPITAL_COMMUNITY)
Admission: RE | Admit: 2019-06-24 | Discharge: 2019-06-24 | Disposition: A | Discharge status: Home / Self Care | Payer: BC Managed Care – PPO | Source: Ambulatory Visit | Attending: Cardiovascular Disease | Admitting: Cardiovascular Disease

## 2019-06-24 VITALS — Ht 71.0 in | Wt 228.0 lb

## 2019-06-24 DIAGNOSIS — Z955 Presence of coronary angioplasty implant and graft: Secondary | ICD-10-CM | POA: Diagnosis not present

## 2019-06-24 DIAGNOSIS — I2111 ST elevation (STEMI) myocardial infarction involving right coronary artery: Secondary | ICD-10-CM | POA: Diagnosis not present

## 2019-06-25 ENCOUNTER — Ambulatory Visit (INDEPENDENT_AMBULATORY_CARE_PROVIDER_SITE_OTHER): Payer: BC Managed Care – PPO | Admitting: Pharmacist Clinician (PhC)/ Clinical Pharmacy Specialist

## 2019-06-25 DIAGNOSIS — E785 Hyperlipidemia, unspecified: Secondary | ICD-10-CM

## 2019-06-25 NOTE — Progress Notes (Signed)
06/25/2019 Willys Salvino 22-Feb-1976 [id number]   HPI:  Nikko Goldwire is a 43 y.o. male patient of Dr. Tresa Endo, who presents today for a lipid clinic evaluation.  Patient presented to the clinic in good spirits. He reported a long history of HLD starting since he was a teenager with a history of Crestor use and was transitioned over to Lipitor for an unknown reason. He is currently doing well on Lipitor. Gave patient a copay card for Xarelto and Repatha. Patient inquired about low testosterone and effects of lowering cholesterol too low.    Current Medications: Atorvastatin 80mg    Risk Factors:  STEMI, CAD Obesity Former smoker (quit 2014) smoked a pack a week Former smokeless tobacco user, stopped ~03/2019  Cholesterol Goals: LDL reduction of 50%   Intolerant/previously tried: N/A  Family history:  Factor V Leiden deficiency in his father and sister Heart attack (passed at age 60) in his paternal grandfather  Hyperlipidemia in his father. still alive, father has history of clots   Diet: eating at home most nights, limiting number of fast foods  -protein: chicken, pork loin, good variety -Vegetables: frozen vegetables  Exercise:  Spurts of exercise Taking part in the [place] cardiac rehab program. Last day is 06/26/19. Lost 6 lbs and a inch off his waist line  Labs:   Ref Range & Units 05/28/19  Cholesterol, Total 100 - 199 mg/dL 05/30/19   Triglycerides 0 - 149 mg/dL 081   HDL 448 mg/dL >18    VLDL Cholesterol Cal 5 - 40 mg/dL 26   LDL Calculated 0 - 99 mg/dL 56DJS     Component Value Date/Time   CHOL 165 03/25/2019 2217   TRIG 99 03/25/2019 2217   HDL 39 (L) 03/25/2019 2217   CHOLHDL 4.2 03/25/2019 2217   VLDL 20 03/25/2019 2217   LDLCALC 106 (H) 03/25/2019 2217   LDL in 2017 155  Current Outpatient Medications  Medication Sig Dispense Refill  . acetaminophen (TYLENOL) 325 MG tablet Take 325-650 mg by mouth every 8 (eight) hours as needed for mild pain,  moderate pain or headache.     2018 atorvastatin (LIPITOR) 80 MG tablet Take 1 tablet (80 mg total) by mouth daily at 6 PM. 90 tablet 3  . cetirizine (ZYRTEC) 10 MG tablet Take 10 mg by mouth daily as needed (for seasonal allergies).     . clopidogrel (PLAVIX) 75 MG tablet Take 1 tablet (75 mg total) by mouth daily. 90 tablet 3  . levothyroxine (SYNTHROID) 100 MCG tablet Take 100 mcg by mouth at bedtime.     Marland Kitchen lisinopril (ZESTRIL) 2.5 MG tablet Take 1 tablet (2.5 mg total) by mouth daily. 90 tablet 3  . nitroGLYCERIN (NITROSTAT) 0.4 MG SL tablet Place 1 tablet (0.4 mg total) under the tongue every 5 (five) minutes as needed for chest pain. 25 tablet 3  . Rivaroxaban (XARELTO) 15 MG TABS tablet Take 1 tablet (15 mg total) by mouth daily with supper. 90 tablet 3   No current facility-administered medications for this visit.     No Known Allergies  Past Medical History:  Diagnosis Date  . Coronary artery disease   . DVT (deep venous thrombosis) (HCC)   . DVT of proximal leg (deep vein thrombosis) (HCC) 09/07/2011  . Factor V Leiden mutation (HCC) 09/07/2011  . Hyperlipidemia   . Thyroid disease     Blood pressure 114/70, pulse 67, height 5\' 11"  (1.803 m), weight 231 lb 6.4 oz (105 kg), SpO2  94 %.  Assessment: Goal: Reduce LDL to 70 Patients most recent LDL was 116. LDL in 2017 was 155 and patient has been on statin therapy for a number of years. Patient was taking rosuvastatin and switched to atorvastatin by provider with no reports of reason for switching. Patient denies common statin ADE and appears to be tolerating current max dose of high intensity statin very well. Patient expressed interest in the best drug therapy on the market to reduce his LDL with the greatest efficacy, which is currently Repatha. He expressed no concerns for self injecting the medications and declined counseling on other daily oral medications.  Patient verbalized understanding of next steps and how to use Repatha  injection.  Plan: 1) Start Repatha 140mg  [place] every 14 days 2) Continue Atorvastatin 80mg  daily 3) Follow up in 8 weeks for a follow up lipid panel.   Ned Clines, PharmD Candidate [phone] [facility]   I was with student and patient for entire visit and agree with above plan.   Tommy Medal PharmD CPP [facility]

## 2019-06-25 NOTE — Patient Instructions (Signed)
We will start the process to get Repatha covered for you.  We should have a response within a week.  Once approved, you should be able to take to copay card to your pharmacy and pay $5/month  Evolocumab injection What is this medicine? EVOLOCUMAB (e voe LOK ue mab) is known as a PCSK9 inhibitor. It is used to lower the level of cholesterol in the blood. It may be used alone or in combination with other cholesterol-lowering drugs. This drug may also be used to reduce the risk of heart attack, stroke, and certain types of heart surgery in patients with heart disease. This medicine may be used for other purposes; ask your health care provider or pharmacist if you have questions. COMMON BRAND NAME(S): Repatha What should I tell my health care provider before I take this medicine? They need to know if you have any of these conditions:  an unusual or allergic reaction to evolocumab, other medicines, foods, dyes, or preservatives  pregnant or trying to get pregnant  breast-feeding How should I use this medicine? This medicine is for injection under the skin. You will be taught how to prepare and give this medicine. Use exactly as directed. Take your medicine at regular intervals. Do not take your medicine more often than directed. It is important that you put your used needles and syringes in a special sharps container. Do not put them in a trash can. If you do not have a sharps container, call your pharmacist or health care provider to get one. Talk to your pediatrician regarding the use of this medicine in children. While this drug may be prescribed for children as young as 13 years for selected conditions, precautions do apply. Overdosage: If you think you have taken too much of this medicine contact a poison control center or emergency room at once. NOTE: This medicine is only for you. Do not share this medicine with others. What if I miss a dose? If you miss a dose, take it as soon as you can if  there are more than 7 days until the next scheduled dose, or skip the missed dose and take the next dose according to your original schedule. Do not take double or extra doses. What may interact with this medicine? Interactions are not expected. This list may not describe all possible interactions. Give your health care provider a list of all the medicines, herbs, non-prescription drugs, or dietary supplements you use. Also tell them if you smoke, drink alcohol, or use illegal drugs. Some items may interact with your medicine. What should I watch for while using this medicine? You may need blood work while you are taking this medicine. What side effects may I notice from receiving this medicine? Side effects that you should report to your doctor or health care professional as soon as possible:  allergic reactions like skin rash, itching or hives, swelling of the face, lips, or tongue  signs and symptoms of high blood sugar such as dizziness; dry mouth; dry skin; fruity breath; nausea; stomach pain; increased hunger or thirst; increased urination  signs and symptoms of infection like fever or chills; cough; sore throat; pain or trouble passing urine Side effects that usually do not require medical attention (report to your doctor or health care professional if they continue or are bothersome):  diarrhea  nausea  muscle pain  pain, redness, or irritation at site where injected This list may not describe all possible side effects. Call your doctor for medical advice about  side effects. You may report side effects to FDA at 1-800-FDA-1088. Where should I keep my medicine? Keep out of the reach of children. You will be instructed on how to store this medicine. Throw away any unused medicine after the expiration date on the label. NOTE: This sheet is a summary. It may not cover all possible information. If you have questions about this medicine, talk to your doctor, pharmacist, or health care  provider.  2020 Elsevier/Gold Standard (2017-04-29 13:31:00)

## 2019-06-26 ENCOUNTER — Encounter (HOSPITAL_COMMUNITY)
Admission: RE | Admit: 2019-06-26 | Discharge: 2019-06-26 | Disposition: A | Discharge status: Home / Self Care | Payer: BC Managed Care – PPO | Source: Ambulatory Visit | Attending: Cardiovascular Disease | Admitting: Cardiovascular Disease

## 2019-06-26 ENCOUNTER — Other Ambulatory Visit: Payer: Self-pay

## 2019-06-26 DIAGNOSIS — I2111 ST elevation (STEMI) myocardial infarction involving right coronary artery: Secondary | ICD-10-CM | POA: Diagnosis not present

## 2019-06-26 DIAGNOSIS — Z955 Presence of coronary angioplasty implant and graft: Secondary | ICD-10-CM

## 2019-06-26 NOTE — Progress Notes (Signed)
Discharge Progress Report  Patient Details  Name: Randy Cannon MRN: [id number] Date of Birth: 09/07/1976 Referring Provider:     CARDIAC REHAB PHASE II ORIENTATION from 04/30/2019 in [place]  Referring Provider  Dr. Claiborne Billings       Number of Visits: 18  Reason for Discharge:  Patient reached a stable level of exercise. Patient independent in their exercise. Patient has met program and personal goals.  Smoking History:  Social History   Tobacco Use  Smoking Status Former Smoker  . Quit date: 2014  . Years since quitting: 6.7  Smokeless Tobacco Current User    Diagnosis:  ST elevation myocardial infarction involving right coronary artery ([place]) 03/25/19  Status post coronary artery stent placement 03/25/19  ADL [facility]SD:   Initial Exercise Prescription: Initial Exercise Prescription - 04/30/19 1000      Date of Initial Exercise RX and Referring Provider   Date  04/30/19    Referring Provider  Dr. Claiborne Billings    Expected Discharge Date  06/12/19      Treadmill   MPH  3.8    Grade  1    Minutes  15      Recumbant Bike   Level  6    Watts  94.4    Minutes  15    METs  5.5      Prescription Details   Frequency (times per week)  3    Duration  Progress to 30 minutes of continuous aerobic without signs/symptoms of physical distress      Intensity   THRR 40-80% of Max Heartrate  70-141    Ratings of Perceived Exertion  11-13      Progression   Progression  Continue to progress workloads to maintain intensity without signs/symptoms of physical distress.      Resistance Training   Training Prescription  Yes    Weight  7 lbs.     Reps  10-15       Discharge Exercise Prescription (Final Exercise Prescription Changes): Exercise Prescription Changes - 06/26/19 1500      Response to Exercise   Blood Pressure (Admit)  118/66    Blood Pressure (Exercise)  100/78    Blood Pressure (Exit)  112/66    Heart Rate (Admit)  83 bpm    Heart  Rate (Exercise)  151 bpm    Heart Rate (Exit)  88 bpm    Rating of Perceived Exertion (Exercise)  14    Symptoms  none    Comments  Pt last day of exercise.     Duration  Progress to 30 minutes of  aerobic without signs/symptoms of physical distress    Intensity  THRR unchanged      Progression   Progression  Continue to progress workloads to maintain intensity without signs/symptoms of physical distress.    Average METs  6.7      Resistance Training   Training Prescription  Yes    Weight  8 lbs.     Reps  10-15    Time  10 Minutes      Treadmill   MPH  3.8    Grade  3    Minutes  15    METs  5.48      Bike   Level  6    Watts  130    Minutes  15    METs  8      Home Exercise Plan   Plans to continue  exercise at  Home (comment)    Frequency  Add 3 additional days to program exercise sessions.    Initial Home Exercises Provided  05/20/19       Functional Capacity: 6 Minute Walk    Row Name 04/30/19 1041 06/24/19 1512       6 Minute Walk   Phase  Initial  Discharge    Distance  1800 feet  2328 feet    Distance % Change  -  29.33 %    Distance Feet Change  -  528 ft    Walk Time  6 minutes  6 minutes    # of Rest Breaks  0  0    MPH  3.4  4.4    METS  4.8  5.8    RPE  9  10    Perceived Dyspnea   0  0    VO2 Peak  16.81  20.37    Symptoms  No  No    Resting HR  53 bpm  73 bpm    Resting BP  118/70  118/72    Resting Oxygen Saturation   99 %  -    Exercise Oxygen Saturation  during 6 min walk  98 %  -    Max Ex. HR  91 bpm  114 bpm    Max Ex. BP  126/78  138/70    2 Minute Post BP  108/64  106/58       Psychological, QOL, Others - Outcomes: PHQ 2/9: Depression screen [facility] 2/9 06/26/2019 05/04/2019 04/30/2019  Decreased Interest 0 0 -  Down, Depressed, Hopeless 0 0 0  PHQ - 2 Score 0 0 0    Quality of Life: Quality of Life - 06/12/19 1446      Quality of Life   Select  Quality of Life      Quality of Life Scores   Health/Function Post  26.83 %     Socioeconomic Post  24.43 %    Psych/Spiritual Post  28.79 %    Family Post  27.3 %    GLOBAL Post  26.81 %       Personal Goals: Goals established at orientation with interventions provided to work toward goal. Personal Goals and Risk Factors at Admission - 04/30/19 1054      Core Components/Risk Factors/Patient Goals on Admission    Weight Management  Yes;Weight Maintenance    Admit Weight  233 lb 7.5 oz (105.9 kg)    Expected Outcomes  Short Term: Continue to assess and modify interventions until short term weight is achieved;Long Term: Adherence to nutrition and physical activity/exercise program aimed toward attainment of established weight goal;Weight Maintenance: Understanding of the daily nutrition guidelines, which includes 25-35% calories from fat, 7% or less cal from saturated fats, less than 244m cholesterol, less than 1.5gm of sodium, & 5 or more servings of fruits and vegetables daily;Understanding recommendations for meals to include 15-35% energy as protein, 25-35% energy from fat, 35-60% energy from carbohydrates, less than 2025mof dietary cholesterol, 20-35 gm of total fiber daily;Understanding of distribution of calorie intake throughout the day with the consumption of 4-5 meals/snacks    Lipids  Yes    Intervention  Provide education and support for participant on nutrition & aerobic/resistive exercise along with prescribed medications to achieve LDL <7075mHDL >22m75m  Expected Outcomes  Short Term: Participant states understanding of desired cholesterol values and is compliant with medications prescribed. Participant is following exercise  prescription and nutrition guidelines.;Long Term: Cholesterol controlled with medications as prescribed, with individualized exercise RX and with personalized nutrition plan. Value goals: LDL < 14m, HDL > 40 mg.        Personal Goals Discharge: Goals and Risk Factor Review    Row Name 05/04/19 1705 05/21/19 1508 06/18/19 1529  06/26/19 1630       Core Components/Risk Factors/Patient Goals Review   Personal Goals Review  Weight Management/Obesity;Lipids  Weight Management/Obesity;Lipids  Weight Management/Obesity;Lipids  Weight Management/Obesity;Lipids    Review  Pt with few CAD RFs willing to participate in CR exercise.  JVishalwould like to get back to his normal and mountain biking.  JHermenhas been doing well with exercise. Alik's weight has been stable.  JLoyaltyhas been doing well with exercise. JJehielhas lost 2 kg and is pleased with his progress. JJaspreetsays he feels stronger.  JRamontegraduated today after completing 18 sessions. He continues to feel stronger and pleased with the progress he has made in CR. He has lost almost 5 lbs and is eating more healthy to manage his lipids.    Expected Outcomes  JDamarrionwill continue to particpate in CR exercise, nutrition, and lifestyle modification opportunities.  Patient will continue to participate in phase 2 cardiac rehab for exercise, nutrtion and lifestyle modifications.  Patient will continue to participate in phase 2 cardiac rehab for exercise, nutrtion and lifestyle modifications.  Patient will continue lifestyle modifications and plans to exericse at home by riding his mountain bike which can be converted to an indoor stationary bike when weather does not permit. He is also open to CR maintenance once program is available.       Exercise Goals and Review: Exercise Goals    Row Name 04/30/19 1043             Exercise Goals   Increase Physical Activity  Yes       Intervention  Provide advice, education, support and counseling about physical activity/exercise needs.;Develop an individualized exercise prescription for aerobic and resistive training based on initial evaluation findings, risk stratification, comorbidities and participant's personal goals.       Expected Outcomes  Short Term: Attend rehab on a regular basis to increase amount of physical activity.;Long  Term: Add in home exercise to make exercise part of routine and to increase amount of physical activity.;Long Term: Exercising regularly at least 3-5 days a week.       Increase Strength and Stamina  Yes       Intervention  Provide advice, education, support and counseling about physical activity/exercise needs.;Develop an individualized exercise prescription for aerobic and resistive training based on initial evaluation findings, risk stratification, comorbidities and participant's personal goals.       Expected Outcomes  Short Term: Increase workloads from initial exercise prescription for resistance, speed, and METs.;Short Term: Perform resistance training exercises routinely during rehab and add in resistance training at home;Long Term: Improve cardiorespiratory fitness, muscular endurance and strength as measured by increased METs and functional capacity (6MWT)       Able to understand and use rate of perceived exertion (RPE) scale  Yes       Intervention  Provide education and explanation on how to use RPE scale       Expected Outcomes  Short Term: Able to use RPE daily in rehab to express subjective intensity level;Long Term:  Able to use RPE to guide intensity level when exercising independently       Knowledge  and understanding of Target Heart Rate Range (THRR)  Yes       Intervention  Provide education and explanation of THRR including how the numbers were predicted and where they are located for reference       Expected Outcomes  Short Term: Able to state/look up THRR;Long Term: Able to use THRR to govern intensity when exercising independently;Short Term: Able to use daily as guideline for intensity in rehab       Able to check pulse independently  Yes       Intervention  Provide education and demonstration on how to check pulse in carotid and radial arteries.;Review the importance of being able to check your own pulse for safety during independent exercise       Expected Outcomes  Short  Term: Able to explain why pulse checking is important during independent exercise;Long Term: Able to check pulse independently and accurately       Understanding of Exercise Prescription  Yes       Intervention  Provide education, explanation, and written materials on patient's individual exercise prescription       Expected Outcomes  Short Term: Able to explain program exercise prescription;Long Term: Able to explain home exercise prescription to exercise independently          Exercise Goals Re-Evaluation: Exercise Goals Re-Evaluation    [place] Name 05/04/19 1647 05/20/19 1600 06/18/19 1442 06/26/19 1557       Exercise Goal Re-Evaluation   Exercise Goals Review  Increase Physical Activity;Increase Strength and Stamina;Able to understand and use rate of perceived exertion (RPE) scale;Knowledge and understanding of Target Heart Rate Range (THRR)  Increase Physical Activity;Understanding of Exercise Prescription;Increase Strength and Stamina;Knowledge and understanding of Target Heart Rate Range (THRR);Able to check pulse independently;Able to understand and use rate of perceived exertion (RPE) scale  Increase Physical Activity;Increase Strength and Stamina;Able to understand and use rate of perceived exertion (RPE) scale;Knowledge and understanding of Target Heart Rate Range (THRR);Understanding of Exercise Prescription;Able to check pulse independently  Increase Physical Activity;Increase Strength and Stamina;Able to understand and use rate of perceived exertion (RPE) scale;Knowledge and understanding of Target Heart Rate Range (THRR);Able to check pulse independently;Understanding of Exercise Prescription    Comments  Patient able to understand and use RPE scale appropriately. Reviewed THRR with patient.  Reviewed HEP with Pt. Pt understands THRR, RPE scale, end points of exercise, weather precautions, warm up and cool down stretches, and NTG use. Pt stated he is walking 30 minutes daily. Pt also stated  he would like to get back to mountain biking. Encouraged Pt to take it slow and moniotr his HR while biking.  Pt is tolerating exercise Rx well and is progressing gradually. Pt is increasing workloads and MET level with each session. MET level is 7.0. Pt is continuing to exercise at home in addition to CR program.  Pt graduated CR program today. Pt progressed well through the program and gradually increased his MET level and workloads. Pt stated he is going to continue walking 5-7 days per week for 30-45 minutes as well as riding his bike occasionally. Pt stated he may join a gym during the winter months to maintain his exercise.    Expected Outcomes  Pateint will progress workloads as tolerated to help achieve personal health and fitness goal.  Will continue to monitor and progress Pt as tolerated.  Will continue to monitor and progress Pt as tolerated.  Will continue exercising at home.       Nutrition &  Weight - Outcomes: Pre Biometrics - 04/30/19 1044      Pre Biometrics   Height  '5\' 11"'  (1.803 m)    Weight  105.9 kg    Waist Circumference  40 inches    Hip Circumference  43.5 inches    Waist to Hip Ratio  0.92 %    BMI (Calculated)  32.58    Triceps Skinfold  21 mm    % Body Fat  29.5 %    Grip Strength  71 kg    Flexibility  18 in    Single Leg Stand  30 seconds      Post Biometrics - 06/24/19 1513       Post  Biometrics   Height  '5\' 11"'  (1.803 m)    Weight  103.4 kg    Waist Circumference  40 inches    Hip Circumference  42.5 inches    Waist to Hip Ratio  0.94 %    BMI (Calculated)  31.81    Triceps Skinfold  21 mm    % Body Fat  29.3 %    Grip Strength  70.5 kg    Flexibility  19 in    Single Leg Stand  35 seconds       Nutrition:   Nutrition Discharge:   Education Questionnaire Score: Knowledge Questionnaire Score - 06/12/19 1444      Knowledge Questionnaire Score   Post Score  24/24       Goals reviewed with patient; copy given to patient. Pt graduated  from cardiac rehab program today with completion of 18 exercise sessions in Phase II. Pt maintained good attendance and progressed nicely during his participation in rehab as evidenced by increased MET level. Medication list reconciled. Repeat  PHQ score-0. Pt has made significant lifestyle changes and should be commended for his success. Pt feels he has achieved his goals during cardiac rehab.

## 2019-06-29 DIAGNOSIS — E785 Hyperlipidemia, unspecified: Secondary | ICD-10-CM | POA: Insufficient documentation

## 2019-07-20 ENCOUNTER — Encounter: Payer: Self-pay | Admitting: Cardiovascular Disease

## 2019-07-20 ENCOUNTER — Other Ambulatory Visit: Payer: Self-pay | Admitting: Pharmacist Clinician (PhC)/ Clinical Pharmacy Specialist

## 2019-07-20 ENCOUNTER — Other Ambulatory Visit: Payer: Self-pay

## 2019-07-20 ENCOUNTER — Ambulatory Visit (INDEPENDENT_AMBULATORY_CARE_PROVIDER_SITE_OTHER): Payer: BC Managed Care – PPO | Admitting: Cardiovascular Disease

## 2019-07-20 DIAGNOSIS — I2111 ST elevation (STEMI) myocardial infarction involving right coronary artery: Secondary | ICD-10-CM

## 2019-07-20 DIAGNOSIS — I2510 Atherosclerotic heart disease of native coronary artery without angina pectoris: Secondary | ICD-10-CM | POA: Diagnosis not present

## 2019-07-20 DIAGNOSIS — Z79899 Other long term (current) drug therapy: Secondary | ICD-10-CM

## 2019-07-20 DIAGNOSIS — D6851 Activated protein C resistance: Secondary | ICD-10-CM

## 2019-07-20 DIAGNOSIS — E785 Hyperlipidemia, unspecified: Secondary | ICD-10-CM

## 2019-07-20 MED ORDER — REPATHA SURECLICK 140 MG/ML ~~LOC~~ SOAJ: 140.0000 mg | SUBCUTANEOUS | 12 refills | Status: DC

## 2019-07-20 MED ORDER — EZETIMIBE 10 MG PO TABS: 10.0000 mg | ORAL_TABLET | Freq: Every day | ORAL | 6 refills | Status: DC

## 2019-07-20 NOTE — Patient Instructions (Signed)
Medication Instructions:  Start Zetia 10 mg in the evening If you need a refill on your cardiac medications before your next appointment, please call your pharmacy.  Labwork: FASTING LIPID, CBC ANDCMET-THE WEEK BEFORE NEXT VISIT HERE IN OUR OFFICE AT [facility]    You will need to fast. DO NOT EAT OR DRINK PAST MIDNIGHT.      If you have labs (blood work) drawn today and your tests are completely normal, you will receive your results only by: Marland Kitchen MyChart Message (if you have MyChart) OR . A paper copy in the mail If you have any lab test that is abnormal or we need to change your treatment, we will call you to review the results.  Follow-Up: IN 3 months In Person You may see Shelva Majestic, MD or one of the following Advanced Practice Providers on your designated Care Team:  Rosaria Ferries, PA-C Jory Sims, DNP, ANP Cadence Kathlen Mody, NP    At [facility], you and your health needs are our priority.  As part of our continuing mission to provide you with exceptional heart care, we have created designated Provider Care Teams.  These Care Teams include your primary Cardiologist (physician) and Advanced Practice Providers (APPs -  Physician Assistants and Nurse Practitioners) who all work together to provide you with the care you need, when you need it.  Thank you for choosing [facility] at [facility]!!

## 2019-07-20 NOTE — Progress Notes (Signed)
Cardiology Office Note    Date:  07/21/2019   ID:  Randy Cannon 02/16/1976, MRN [id number]  PCP:  Kathyrn Lass, MD  Cardiologist:  Shelva Majestic, MD   Initial office visit with me following his STEMI in June 2020  History of Present Illness:  Randy Cannon is a 43 y.o. male who has a history of heterozygous factor V Leiden deficiency, remote DVTs in 2006 in 2012 with IVC filter and who had been on chronic Xarelto anticoagulation.  He has a longstanding history of hyperlipidemia with high cholesterols at a young age suggesting possible familial hyperlipidemia.  He denied any prior history of chest pain but on March 25, 2019 while riding a mountain bike [place] trail developed severe chest pain, required EMS rescue via boat [place] get to [place] and ECG upon arrival to [facility] revealed inferolateral ST elevation with precordial ST depression and a code STEMI was activated.  I performed emergent cardiac catheterization in the setting of his acute inferior ST elevation MI which was secondary to total occlusion of a very large dominant RCA approximately with extensive thrombus burden.  He was also found to have multivessel CAD with mild ostial tapering of a long left main, 90% proximal stenosis in a very proximal diagonal branch of his LAD, 70% proximal stenosis in the ramus intermediate vessel, total occlusion of the mid circumflex coronary artery with retrograde filling of the distal obtuse marginal branch the LAD collaterals and total proximal occlusion of his RCA with extensive thrombus burden with faint collateralization to the PDA and PLA vessels from the left coronary circulation.  He underwent difficult but successful PCI to his large RCA requiring PTCA, extensive thrombectomy, Aggrastat with reperfusion induced hypotension and bradycardia necessitating significant fluid resuscitation, atropine and Levophed and ultimately a Resolute Onyx 4.0 x 22 mm stent was inserted and  postdilated to 4.4 mm with 100% occlusion being reduced to 0% and resumption of brisk TIMI-3 flow.  Once the vessel was open there was a very complex 80% trifurcation stenosis the distal RCA at the takeoff of a PDA, large inferior LV branch, and continuation branch with thrombus in the mid PLA with which was collateralized from the left circulation.  He was maintained on Aggrastat and 2 days later on March 27, 2019 he was taken back to the catheterization laboratory and I performed staged complex multi lesion intervention involving the trifurcation stenosis of his distal RCA with Cutting Balloon intervention at all sites and ultimate stenting to the distal RCA into the ostium of the PDA to with a 3.0 x 12 mm Resolute Onyx stent with the 80% stenosis being reduced to 0%, Cutting Balloon to the PDA 1 with the 80% stenosis being reduced to 5%, and Cutting Balloon to the PLA vessel at the ostium being reduced to 5%.  His proximal RCA stent that had been placed 2 days previously had mild narrowing and this was further postdilated to 4.56 mm.  He also underwent successful PCI/DES stenting of the diagonal vessel with insertion of a 2.5 x 15 mm Resolute stent postdilated to 2.75 mm.  He was restarted on Xarelto following his intervention the following day with plans for 1 month of aspirin/Plavix and Xarelto with discontinuance of aspirin after 1 month.  Subsequent to his intervention he has been seen in the office by Almyra Deforest on April 30, 2019.  At that time, he was advised to discontinue aspirin after 1 month.  He  on low-dose lisinopril 2.5 mg.  Patient participated in cardiac rehab following his MI.  He has also been evaluated by his hematologist Dr. Ennever.  Who felt that once he is off Plavix he will then resume full dose Xarelto at 20 mg rather than 50 mg.  The patient has been on atorvastatin 80 mg.  Repeat laboratory in August 2020 showed total cholesterol 174, HDL 32, LDL 116 and triglycerides 131.  The  paperwork has initiated for institution of PCSK9 inhibition but this has not yet been completed.  He presents for his initial office visit with me. ° °Past Medical History:  °Diagnosis Date  °• Coronary artery disease   °• DVT (deep venous thrombosis) (HCC)   °• DVT of proximal leg (deep vein thrombosis) (HCC) 09/07/2011  °• Factor V Leiden mutation (HCC) 09/07/2011  °• Hyperlipidemia   °• Thyroid disease   ° ° °Past Surgical History:  °Procedure Laterality Date  °• CARDIAC CATHETERIZATION    °• CORONARY STENT INTERVENTION N/A 03/27/2019  ° Procedure: CORONARY STENT INTERVENTION;  Surgeon: Kelly, Thomas A, MD;  Location: [facility];  Service: Cardiovascular;  Laterality: N/A;  °• CORONARY/GRAFT ACUTE MI REVASCULARIZATION N/A 03/25/2019  ° Procedure: Coronary/Graft Acute MI Revascularization;  Surgeon: Kelly, Thomas A, MD;  Location: [facility];  Service: Cardiovascular;  Laterality: N/A;  °• LEFT HEART CATH AND CORONARY ANGIOGRAPHY N/A 03/25/2019  ° Procedure: LEFT HEART CATH AND CORONARY ANGIOGRAPHY;  Surgeon: Kelly, Thomas A, MD;  Location: [facility];  Service: Cardiovascular;  Laterality: N/A;  ° ° °Current Medications: °Outpatient Medications Prior to Visit  °Medication Sig Dispense Refill  °• acetaminophen (TYLENOL) 325 MG tablet Take 325-650 mg by mouth every 8 (eight) hours as needed for mild pain, moderate pain or headache.     °• atorvastatin (LIPITOR) 80 MG tablet Take 1 tablet (80 mg total) by mouth daily at 6 PM. 90 tablet 3  °• cetirizine (ZYRTEC) 10 MG tablet Take 10 mg by mouth daily as needed (for seasonal allergies).     °• clopidogrel (PLAVIX) 75 MG tablet Take 1 tablet (75 mg total) by mouth daily. 90 tablet 3  °• levothyroxine (SYNTHROID) 100 MCG tablet Take 100 mcg by mouth at bedtime.     °• lisinopril (ZESTRIL) 2.5 MG tablet Take 1 tablet (2.5 mg total) by mouth daily. 90 tablet 3  °• nitroGLYCERIN (NITROSTAT) 0.4 MG SL tablet Place 1 tablet (0.4 mg total) under the tongue  every 5 (five) minutes as needed for chest pain. 25 tablet 3  °• Rivaroxaban (XARELTO) 15 MG TABS tablet Take 1 tablet (15 mg total) by mouth daily with supper. 90 tablet 3  ° °No facility-administered medications prior to visit.   °  ° °Allergies:   Patient has no known allergies.  ° °Social History  ° °Socioeconomic History  °• Marital status: Married  °  Spouse name: Not on file  °• Number of children: 1  °• Years of education: 16  °• Highest education level: Not on file  °Occupational History  °• Not on file  °Social Needs  °• Financial resource strain: Not hard at all  °• Food insecurity  °  Worry: Never true  °  Inability: Never true  °• Transportation needs  °  Medical: No  °  Non-medical: No  °Tobacco Use  °• Smoking status: Former Smoker  °  Quit date: 2014  °  Years since quitting: 6.8  °• Smokeless tobacco: Current   User  °Substance and Sexual Activity  °• Alcohol use: Yes  °• Drug use: Not Currently  °• Sexual activity: Not on file  °Lifestyle  °• Physical activity  °  Days per week: 7 days  °  Minutes per session: 30 min  °• Stress: To some extent  °Relationships  °• Social connections  °  Talks on phone: Not on file  °  Gets together: Not on file  °  Attends religious service: Not on file  °  Active member of club or organization: Not on file  °  Attends meetings of clubs or organizations: Not on file  °  Relationship status: Not on file  °Other Topics Concern  °• Not on file  °Social History Narrative  °• Not on file  °  ° °Family History:  The patient's family history includes Factor V Leiden deficiency in his father; Heart attack (age of onset: 50) in his paternal grandfather; Hyperlipidemia in his father.  ° °ROS °General: Negative; No fevers, chills, or night sweats;  °HEENT: Negative; No changes in vision or hearing, sinus congestion, difficulty swallowing °Pulmonary: Negative; No cough, wheezing, shortness of breath, hemoptysis °Cardiovascular: Negative; No chest pain, presyncope, syncope,  palpitations °GI: Negative; No nausea, vomiting, diarrhea, or abdominal pain °GU: Negative; No dysuria, hematuria, or difficulty voiding °Musculoskeletal: Negative; no myalgias, joint pain, or weakness °Hematologic/Oncology: Heterozygous for factor V deficiency °Endocrine: Negative; no heat/cold intolerance; no diabetes °Neuro: Negative; no changes in balance, headaches °Skin: Negative; No rashes or skin lesions °Psychiatric: Negative; No behavioral problems, depression °Sleep: Negative; No snoring, daytime sleepiness, hypersomnolence, bruxism, restless legs, hypnogognic hallucinations, no cataplexy °Other comprehensive 14 point system review is negative. ° ° °PHYSICAL EXAM:   °VS:  BP 134/82    Pulse 60    Temp 97.7 °F (36.5 °C)    Ht 5' 11" (1.803 m)    Wt 232 lb (105.2 kg)    SpO2 98%    BMI 32.36 kg/m²    ° °Repeat blood pressure by me 126/80 ° °Wt Readings from Last 3 Encounters:  °07/20/19 232 lb (105.2 kg)  °06/25/19 231 lb 6.4 oz (105 kg)  °06/24/19 227 lb 15.3 oz (103.4 kg)  °  °General: Alert, oriented, no distress.  °Skin: normal turgor, no rashes, warm and dry °HEENT: Normocephalic, atraumatic. Pupils equal round and reactive to light; sclera anicteric; extraocular muscles intact;  °Nose without nasal septal hypertrophy °Mouth/Parynx benign; Mallinpatti scale 3 °Neck: No JVD, no carotid bruits; normal carotid upstroke °Lungs: clear to ausculatation and percussion; no wheezing or rales °Chest wall: without tenderness to palpitation °Heart: PMI not displaced, RRR, s1 s2 normal, 1/6 systolic murmur, no diastolic murmur, no rubs, gallops, thrills, or heaves °Abdomen: soft, nontender; no hepatosplenomehaly, BS+; abdominal aorta nontender and not dilated by palpation. °Back: no CVA tenderness °Pulses 2+ °Musculoskeletal: full range of motion, normal strength, no joint deformities °Extremities: no clubbing cyanosis or edema, Homan's sign negative  °Neurologic: grossly nonfocal; Cranial nerves grossly  wnl °Psychologic: Normal mood and affect ° ° °Studies/Labs Reviewed:  ° °EKG:  EKG is ordered today.  ECG (independently read by me): Sinus bradycardia at 50 bpm with mild sinus arrhythmia.  Small Q-wave in lead III with preserved R waves in lead II small R wave in lead aVF.  Normal intervals. ° °Recent Labs: °BMP Latest Ref Rng & Units 04/22/2019 03/28/2019 03/26/2019  °Glucose 70 - 99 mg/dL 93 103(H) 121(H)  °BUN 6 - 20 mg/dL 15 8 13  °Creatinine   Creatinine 0.61 - 1.24 mg/dL 1.06 0.82 0.97  Sodium 135 - 145 mmol/L 139 138 137  Potassium 3.5 - 5.1 mmol/L 4.0 3.7 4.0  Chloride 98 - 111 mmol/L 103 108 106  CO2 22 - 32 mmol/L _0 Calcium 8.9 - 10.3 mg/dL 9.2 9.0 9.2     Hepatic Function Latest Ref Rng & Units 05/28/2019 04/22/2019 03/25/2019  Total Protein 6.0 - 8.5 g/dL 7.2 7.0 7.9  Albumin 4.0 - 5.0 g/dL 4.8 4.8 4.9  AST 0 - 40 IU/L _1 ALT 0 - 44 IU/L 53(H) 37 51(H)  Alk Phosphatase 39 - 117 IU/L 81 76 64  Total Bilirubin 0.0 - 1.2 mg/dL 0.7 0.5 0.9  Bilirubin, Direct 0.00 - 0.40 mg/dL 0.18 - -    CBC Latest Ref Rng & Units 04/22/2019 03/28/2019 03/26/2019  WBC 4.0 - 10.5 K/uL 6.0 8.1 10.5  Hemoglobin 13.0 - 17.0 g/dL 15.7 13.6 13.9  Hematocrit 39.0 - 52.0 % 47.6 40.6 41.2  Platelets 150 - 400 K/uL 231 199 226   Lab Results  Component Value Date   MCV 88.1 04/22/2019   MCV 87.3 03/28/2019   MCV 87.8 03/26/2019   No results found for: TSH Lab Results  Component Value Date   HGBA1C 5.2 04/21/2011     BNP No results found for: BNP  ProBNP No results found for: PROBNP   Lipid Panel     Component Value Date/Time   CHOL 174 05/28/2019 0813   TRIG 131 05/28/2019 0813   HDL 32 (L) 05/28/2019 0813   CHOLHDL 5.4 (H) 05/28/2019 0813   CHOLHDL 4.2 03/25/2019 2217   VLDL 20 03/25/2019 2217   LDLCALC 116 (H) 05/28/2019 0813   LABVLDL 26 05/28/2019 0813     RADIOLOGY: No results found.   Additional studies/ records that were reviewed today include:  I reviewed the patient's  June 2020 hospitalization with both his cardiac catheterization, initial intervention and subsequent staged PCI procedures.  Reviewed the recent follow-up evaluation hematologically with Dr. Marin Olp and evaluation from [facility], PA-C.    ASSESSMENT:    1. STEMI involving right coronary artery ([place]): 03/25/2019 treated with DES stenting to the proximal RCA.   2. Coronary artery disease involving native coronary artery of native heart without angina pectoris: Is post staged PCI to trifurcation distal RCA stenosis (PDA, PDA 2, PLA vessel) and DES to Dx.     3. Hyperlipidemia LDL goal <70   4. Medication management   5. Factor V Leiden mutation [facility])     PLAN:  Randy Cannon is an active 43 year old gentleman who has a history of heterozygous factor V deficiency and subsequently has been on lifelong anticoagulation following significant prior episodes of DVTs in the past.  Despite being on anticoagulation, follow-up severe new onset chest pain while biking on a mountain bike on March 25, 2019 and was found to have acute inferior ST segment elevation myocardial infarction secondary to total occlusion of a very large dominant RCA proximally.  There was extensive thrombus burden.  He had significant concomitant CAD.  I reviewed his catheterization findings on initial intervention with him in detail as well as his subsequent staged PCI to his trifurcation stenosis in his distal RCA and stenting of his diagonal vessel 2 days later on March 27, 2019.  Allowing his interventions, he has been asymptomatic with reference to chest pain and feels very well.  He had significant hyperlipidemia and has been on atorvastatin  80 mg.  Most recent LDL cholesterol is still increased at 116.  The paperwork is in progress to initiate Repatha.  However I will also initiate Zetia 10 mg today and attempt to obtain an additional lowering of 20-25 points.  I reviewed with him the 4-year trial data with Repatha.  In retrospect, the  the patient states that he had experienced heartburn-like sensation for the past year which he always attributed to gas.  He has not felt this since his interventions to his multiple vessels and in his words he "feels great."BMI is increased at 32.36 consistent with mild obesity.  His blood pressure today is stable.  His ECG is significant improved from previously suggesting myocardial salvage.  Recommended he continue to be on Plavix and Xarelto 15 mg daily until I see him in follow-up in 3 months.  If at that time he continues to remain stable Plavix will be discontinued and his aspirin will be reinstituted at 62-monthmilligrams with Xarelto increasing to 20 mg.  Hopefully he will be started with Repatha PCSK9 inhibition and repeat laboratory will be obtained prior to his next office visit.  Time spent: 30 minutes  Medication Adjustments/Labs and Tests Ordered: Current medicines are reviewed at length with the patient today.  Concerns regarding medicines are outlined above.  Medication changes, Labs and Tests ordered today are listed in the Patient Instructions below. Patient Instructions  Medication Instructions:  Start Zetia 10 mg in the evening If you need a refill on your cardiac medications before your next appointment, please call your pharmacy.  Labwork: FASTING LIPID, CBC ANDCMET-THE WEEK BEFORE NEXT VISIT HERE IN OUR OFFICE AT [facility]    You will need to fast. DO NOT EAT OR DRINK PAST MIDNIGHT.      If you have labs (blood work) drawn today and your tests are completely normal, you will receive your results only by:  MSlater(if you have MyChart) OR  A paper copy in the mail If you have any lab test that is abnormal or we need to change your treatment, we will call you to review the results.  Follow-Up: IN 3 months In Person You may see TShelva Majestic MD or one of the following Advanced Practice Providers on your designated Care Team:  RRosaria Ferries PA-C KJory Sims  DNP, ANP Cadence FKathlen Mody NP    At [facility] you and your health needs are our priority.  As part of our continuing mission to provide you with exceptional heart care, we have created designated Provider Care Teams.  These Care Teams include your primary Cardiologist (physician) and Advanced Practice Providers (APPs -  Physician Assistants and Nurse Practitioners) who all work together to provide you with the care you need, when you need it.  Thank you for choosing [facility]!          Signed, TShelva Majestic MD  07/21/2019 3:59 PM    [id number] [place]  [id number]: [phone]

## 2019-07-21 ENCOUNTER — Encounter: Payer: Self-pay | Admitting: Cardiovascular Disease

## 2019-08-26 ENCOUNTER — Other Ambulatory Visit: Payer: BC Managed Care – PPO

## 2019-08-26 ENCOUNTER — Ambulatory Visit: Payer: BC Managed Care – PPO | Admitting: Hematology & Oncology

## 2019-09-03 ENCOUNTER — Inpatient Hospital Stay: Payer: BC Managed Care – PPO | Attending: Hematology & Oncology

## 2019-09-03 ENCOUNTER — Encounter: Payer: Self-pay | Admitting: Hematology & Oncology

## 2019-09-03 ENCOUNTER — Other Ambulatory Visit: Payer: Self-pay

## 2019-09-03 ENCOUNTER — Inpatient Hospital Stay (HOSPITAL_BASED_OUTPATIENT_CLINIC_OR_DEPARTMENT_OTHER): Payer: BC Managed Care – PPO | Admitting: Hematology & Oncology

## 2019-09-03 VITALS — BP 121/81 | HR 63 | Temp 97.1°F | Resp 18 | Wt 230.0 lb

## 2019-09-03 DIAGNOSIS — I219 Acute myocardial infarction, unspecified: Secondary | ICD-10-CM | POA: Insufficient documentation

## 2019-09-03 DIAGNOSIS — I2510 Atherosclerotic heart disease of native coronary artery without angina pectoris: Secondary | ICD-10-CM | POA: Diagnosis not present

## 2019-09-03 DIAGNOSIS — D6851 Activated protein C resistance: Secondary | ICD-10-CM | POA: Diagnosis not present

## 2019-09-03 DIAGNOSIS — Z7902 Long term (current) use of antithrombotics/antiplatelets: Secondary | ICD-10-CM | POA: Diagnosis not present

## 2019-09-03 DIAGNOSIS — Z7901 Long term (current) use of anticoagulants: Secondary | ICD-10-CM | POA: Insufficient documentation

## 2019-09-03 DIAGNOSIS — I252 Old myocardial infarction: Secondary | ICD-10-CM | POA: Insufficient documentation

## 2019-09-03 DIAGNOSIS — I25110 Atherosclerotic heart disease of native coronary artery with unstable angina pectoris: Secondary | ICD-10-CM

## 2019-09-03 DIAGNOSIS — Z79899 Other long term (current) drug therapy: Secondary | ICD-10-CM | POA: Diagnosis not present

## 2019-09-03 DIAGNOSIS — R5383 Other fatigue: Secondary | ICD-10-CM | POA: Diagnosis not present

## 2019-09-03 LAB — CMP (CANCER CENTER ONLY)
ALT: 49 U/L — ABNORMAL HIGH (ref 0–44)
AST: 22 U/L (ref 15–41)
Albumin: 5.1 g/dL — ABNORMAL HIGH (ref 3.5–5.0)
Alkaline Phosphatase: 75 U/L (ref 38–126)
Anion gap: 5 (ref 5–15)
BUN: 13 mg/dL (ref 6–20)
CO2: 30 mmol/L (ref 22–32)
Calcium: 9.9 mg/dL (ref 8.9–10.3)
Chloride: 103 mmol/L (ref 98–111)
Creatinine: 0.94 mg/dL (ref 0.61–1.24)
GFR, Est AFR Am: >60 mL/min (ref >60)
GFR, Estimated: >60 mL/min (ref >60)
Glucose, Bld: 89 mg/dL (ref 70–99)
Potassium: 4.1 mmol/L (ref 3.5–5.1)
Sodium: 138 mmol/L (ref 135–145)
Total Bilirubin: 0.8 mg/dL (ref 0.3–1.2)
Total Protein: 7.5 g/dL (ref 6.5–8.1)

## 2019-09-03 LAB — CBC WITH DIFFERENTIAL (CANCER CENTER ONLY)
Abs Immature Granulocytes: 0.02 10*3/uL (ref 0.00–0.07)
Basophils Absolute: 0.1 10*3/uL (ref 0.0–0.1)
Basophils Relative: 1 %
Eosinophils Absolute: 0.2 10*3/uL (ref 0.0–0.5)
Eosinophils Relative: 3 %
HCT: 47.7 % (ref 39.0–52.0)
Hemoglobin: 16.1 g/dL (ref 13.0–17.0)
Immature Granulocytes: 0 %
Lymphocytes Relative: 36 %
Lymphs Abs: 2.4 10*3/uL (ref 0.7–4.0)
MCH: 29.5 pg (ref 26.0–34.0)
MCHC: 33.8 g/dL (ref 30.0–36.0)
MCV: 87.5 fL (ref 80.0–100.0)
Monocytes Absolute: 0.8 10*3/uL (ref 0.1–1.0)
Monocytes Relative: 11 %
Neutro Abs: 3.3 10*3/uL (ref 1.7–7.7)
Neutrophils Relative %: 49 %
Platelet Count: 244 10*3/uL (ref 150–400)
RBC: 5.45 MIL/uL (ref 4.22–5.81)
RDW: 12.8 % (ref 11.5–15.5)
WBC Count: 6.7 10*3/uL (ref 4.0–10.5)
nRBC: 0 % (ref 0.0–0.2)

## 2019-09-03 NOTE — Progress Notes (Signed)
Hematology and Oncology Follow Up Visit  Randy Cannon [id number] 12-Jun-1976 43 y.o. 09/03/2019   Principle Diagnosis:   Factor V Leiden mutation-heterozygous  Coronary artery disease-status post myocardial infarctions with stents  Current Therapy:    Xarelto 15 mg p.o. daily  Plavix 75 mg p.o. daily       Interim History:  [name] is back for a follow-up.  He looks fantastic.  I must say that I am just very happy that he is doing so well right now.  He and his wife and daughter have a new house in [place].  They just moved in about a month ago.  He is very excited about this.  He is being very aggressive with his cholesterol.  He is on Repatha along with Lipitor.  I think he is probably on another lipid-lowering agent.  It sounds like cardiology really wants to drive his cholesterol level down.  He is no longer taking aspirin.  He stopped this a couple months ago.  He has had no problems with chest pain.  He has had no shortness of breath.  He is getting a lot of exercise with his house.  He has had no leg swelling.  There has been no bleeding.  He has had no change in bowel or bladder habits.  Overall, his performance status is ECOG 0.   Medications:  Current Outpatient Medications:  .  acetaminophen (TYLENOL) 325 MG tablet, Take 325-650 mg by mouth every 8 (eight) hours as needed for mild pain, moderate pain or headache. , Disp: , Rfl:  .  atorvastatin (LIPITOR) 80 MG tablet, Take 1 tablet (80 mg total) by mouth daily at 6 PM., Disp: 90 tablet, Rfl: 3 .  cetirizine (ZYRTEC) 10 MG tablet, Take 10 mg by mouth daily as needed (for seasonal allergies). , Disp: , Rfl:  .  clopidogrel (PLAVIX) 75 MG tablet, Take 1 tablet (75 mg total) by mouth daily., Disp: 90 tablet, Rfl: 3 .  Evolocumab (REPATHA SURECLICK) 785 MG/ML SOAJ, Inject 140 mg into the skin every 14 (fourteen) days., Disp: 2 pen, Rfl: 12 .  ezetimibe (ZETIA) 10 MG tablet, Take 1 tablet (10 mg total) by  mouth daily., Disp: 30 tablet, Rfl: 6 .  levothyroxine (SYNTHROID) 100 MCG tablet, Take 100 mcg by mouth at bedtime. , Disp: , Rfl:  .  lisinopril (ZESTRIL) 2.5 MG tablet, Take 1 tablet (2.5 mg total) by mouth daily., Disp: 90 tablet, Rfl: 3 .  nitroGLYCERIN (NITROSTAT) 0.4 MG SL tablet, Place 1 tablet (0.4 mg total) under the tongue every 5 (five) minutes as needed for chest pain., Disp: 25 tablet, Rfl: 3 .  Rivaroxaban (XARELTO) 15 MG TABS tablet, Take 1 tablet (15 mg total) by mouth daily with supper., Disp: 90 tablet, Rfl: 3  Allergies: No Known Allergies  Past Medical History, Surgical history, Social history, and Family History were reviewed and updated.  Review of Systems: Review of Systems  Constitutional: Positive for fatigue.  HENT:  Negative.   Eyes: Negative.   Respiratory: Negative.   Cardiovascular: Negative.   Gastrointestinal: Negative.   Endocrine: Negative.   Genitourinary: Negative.    Musculoskeletal: Negative.   Skin: Negative.   Neurological: Negative.   Hematological: Negative.   Psychiatric/Behavioral: Negative.     Physical Exam:  weight is 230 lb (104.3 kg). His temporal temperature is 97.1 F (36.2 C) (abnormal). His blood pressure is 121/81 and his pulse is 63. His respiration is 18 and oxygen saturation  is 100%.   Wt Readings from Last 3 Encounters:  09/03/19 230 lb (104.3 kg)  07/20/19 232 lb (105.2 kg)  06/25/19 231 lb 6.4 oz (105 kg)    Physical Exam Vitals signs reviewed.  HENT:     Head: Normocephalic and atraumatic.  Eyes:     Pupils: Pupils are equal, round, and reactive to light.  Neck:     Musculoskeletal: Normal range of motion.  Cardiovascular:     Rate and Rhythm: Normal rate and regular rhythm.     Heart sounds: Normal heart sounds.  Pulmonary:     Effort: Pulmonary effort is normal.     Breath sounds: Normal breath sounds.  Abdominal:     General: Bowel sounds are normal.     Palpations: Abdomen is soft.   Musculoskeletal: Normal range of motion.        General: No tenderness or deformity.  Lymphadenopathy:     Cervical: No cervical adenopathy.  Skin:    General: Skin is warm and dry.     Findings: No erythema or rash.  Neurological:     Mental Status: He is alert and oriented to person, place, and time.  Psychiatric:        Behavior: Behavior normal.        Thought Content: Thought content normal.        Judgment: Judgment normal.      Lab Results  Component Value Date   WBC 6.7 09/03/2019   HGB 16.1 09/03/2019   HCT 47.7 09/03/2019   MCV 87.5 09/03/2019   PLT 244 09/03/2019     Chemistry      Component Value Date/Time   NA 138 09/03/2019 1158   K 4.1 09/03/2019 1158   CL 103 09/03/2019 1158   CO2 30 09/03/2019 1158   BUN 13 09/03/2019 1158   CREATININE 0.94 09/03/2019 1158      Component Value Date/Time   CALCIUM 9.9 09/03/2019 1158   ALKPHOS 75 09/03/2019 1158   AST 22 09/03/2019 1158   ALT 49 (H) 09/03/2019 1158   BILITOT 0.8 09/03/2019 1158       Impression and Plan: [name] is a 43 year old white male.  He has a history of factor V Leiden mutation.  He subsequently developed a myocardial infarction.  He is on highly aggressive therapy now.  He is on both Plavix and Xarelto.  I think he is going to need lifelong Xarelto.  I am just glad that he is doing so well right now.  We will plan to get him back in 6 months.  I think this would be reasonable.  I am happy that he is going to enjoy his new house.  It sounds like he has a lot of plans for it.    Josph Macho, MD 12/3/202012:36 PM

## 2019-09-07 ENCOUNTER — Other Ambulatory Visit: Payer: BC Managed Care – PPO

## 2019-09-20 DIAGNOSIS — Z20828 Contact with and (suspected) exposure to other viral communicable diseases: Secondary | ICD-10-CM | POA: Diagnosis not present

## 2019-10-16 DIAGNOSIS — E785 Hyperlipidemia, unspecified: Secondary | ICD-10-CM | POA: Diagnosis not present

## 2019-10-16 DIAGNOSIS — Z79899 Other long term (current) drug therapy: Secondary | ICD-10-CM | POA: Diagnosis not present

## 2019-10-17 LAB — COMPREHENSIVE METABOLIC PANEL
ALT: 47 IU/L — ABNORMAL HIGH (ref 0–44)
AST: 27 IU/L (ref 0–40)
Albumin/Globulin Ratio: 2 (ref 1.2–2.2)
Albumin: 5 g/dL (ref 4.0–5.0)
Alkaline Phosphatase: 84 IU/L (ref 39–117)
BUN/Creatinine Ratio: 14 (ref 9–20)
BUN: 14 mg/dL (ref 6–24)
Bilirubin Total: 0.7 mg/dL (ref 0.0–1.2)
CO2: 18 mmol/L — ABNORMAL LOW (ref 20–29)
Calcium: 9.9 mg/dL (ref 8.7–10.2)
Chloride: 106 mmol/L (ref 96–106)
Creatinine, Ser: 1.01 mg/dL (ref 0.76–1.27)
GFR calc Af Amer: 103 mL/min/{1.73_m2} (ref >59)
GFR calc non Af Amer: 89 mL/min/{1.73_m2} (ref >59)
Globulin, Total: 2.5 g/dL (ref 1.5–4.5)
Glucose: 86 mg/dL (ref 65–99)
Potassium: 4.1 mmol/L (ref 3.5–5.2)
Sodium: 144 mmol/L (ref 134–144)
Total Protein: 7.5 g/dL (ref 6.0–8.5)

## 2019-10-17 LAB — CBC
Hematocrit: 52.1 % — ABNORMAL HIGH (ref 37.5–51.0)
Hemoglobin: 17.7 g/dL (ref 13.0–17.7)
MCH: 30.1 pg (ref 26.6–33.0)
MCHC: 34 g/dL (ref 31.5–35.7)
MCV: 89 fL (ref 79–97)
Platelets: 279 10*3/uL (ref 150–450)
RBC: 5.88 x10E6/uL — ABNORMAL HIGH (ref 4.14–5.80)
RDW: 12.4 % (ref 11.6–15.4)
WBC: 6.9 10*3/uL (ref 3.4–10.8)

## 2019-10-20 ENCOUNTER — Ambulatory Visit (INDEPENDENT_AMBULATORY_CARE_PROVIDER_SITE_OTHER): Payer: BC Managed Care – PPO | Admitting: Cardiovascular Disease

## 2019-10-20 ENCOUNTER — Other Ambulatory Visit: Payer: Self-pay

## 2019-10-20 VITALS — BP 127/85 | HR 64 | Temp 97.2°F | Ht 71.0 in | Wt 233.0 lb

## 2019-10-20 DIAGNOSIS — I2510 Atherosclerotic heart disease of native coronary artery without angina pectoris: Secondary | ICD-10-CM | POA: Diagnosis not present

## 2019-10-20 DIAGNOSIS — I2111 ST elevation (STEMI) myocardial infarction involving right coronary artery: Secondary | ICD-10-CM | POA: Diagnosis not present

## 2019-10-20 DIAGNOSIS — D6851 Activated protein C resistance: Secondary | ICD-10-CM

## 2019-10-20 DIAGNOSIS — E785 Hyperlipidemia, unspecified: Secondary | ICD-10-CM | POA: Diagnosis not present

## 2019-10-20 DIAGNOSIS — E039 Hypothyroidism, unspecified: Secondary | ICD-10-CM

## 2019-10-20 LAB — LIPID PANEL
Chol/HDL Ratio: 1.8 ratio (ref 0.0–5.0)
Cholesterol, Total: 67 mg/dL — ABNORMAL LOW (ref 100–199)
HDL: 38 mg/dL — ABNORMAL LOW (ref >39)
LDL Chol Calc (NIH): 11 mg/dL (ref 0–99)
Triglycerides: 90 mg/dL (ref 0–149)
VLDL Cholesterol Cal: 18 mg/dL (ref 5–40)

## 2019-10-20 NOTE — Patient Instructions (Signed)
Medication Instructions:  Continue current medications  *If you need a refill on your cardiac medications before your next appointment, please call your pharmacy*  Lab Work: Fasting Lipids  If you have labs (blood work) drawn today and your tests are completely normal, you will receive your results only by: Marland Kitchen MyChart Message (if you have MyChart) OR . A paper copy in the mail If you have any lab test that is abnormal or we need to change your treatment, we will call you to review the results.  Testing/Procedures: None Ordered  Follow-Up: At [facility], you and your health needs are our priority.  As part of our continuing mission to provide you with exceptional heart care, we have created designated Provider Care Teams.  These Care Teams include your primary Cardiologist (physician) and Advanced Practice Providers (APPs -  Physician Assistants and Nurse Practitioners) who all work together to provide you with the care you need, when you need it.  Your next appointment:   6 month(s)  The format for your next appointment:   In Person  Provider:   Nicki Guadalajara, MD

## 2019-10-20 NOTE — Progress Notes (Signed)
Cardiology Office Note    Date:  10/22/2019   ID:  Randy Cannon, Randy Cannon March 28, 1976, MRN [id number]  PCP:  Randy Lass, MD  Cardiologist:  Randy Majestic, MD     History of Present Illness:  Randy Cannon is a 43 y.o. male who presents for a 54-monthfollow-up cardiology evaluation.   Randy Cannon a history of heterozygous factor V Leiden deficiency, remote DVTs in 2006 in 2012 with IVC filter and who had been on chronic Xarelto anticoagulation.  He has a longstanding history of hyperlipidemia with high cholesterols at a young age suggesting possible familial hyperlipidemia.  He denied any prior history of chest pain but on March 25, 2019 while [place] a mountain bike [place] trail developed severe chest pain, required EMS rescue via [place] and [facility] ST depression and a code STEMI was activated.  I performed emergent cardiac catheterization in the setting of his acute inferior ST elevation MI which was secondary to total occlusion of a very large dominant RCA approximately with extensive thrombus burden.  He was also found to have multivessel CAD with mild ostial tapering of a long left main, 90% proximal stenosis in a very proximal diagonal branch of his LAD, 70% proximal stenosis in the ramus intermediate vessel, total occlusion of the mid circumflex coronary artery with retrograde filling of the distal obtuse marginal branch the LAD collaterals and total proximal occlusion of his RCA with extensive thrombus burden with faint collateralization to the PDA and PLA vessels from the left coronary circulation.  He underwent difficult but successful PCI to his large RCA requiring PTCA, extensive thrombectomy, Aggrastat with reperfusion induced hypotension and bradycardia necessitating significant fluid resuscitation, atropine and Levophed and ultimately a Resolute Onyx 4.0 x 22 mm stent was inserted  and postdilated to 4.4 mm with 100% occlusion being reduced to 0% and resumption of brisk TIMI-3 flow.  Once the vessel was open there was a very complex 80% trifurcation stenosis the distal RCA at the takeoff of a PDA, large inferior LV branch, and continuation branch with thrombus in the mid PLA with which was collateralized from the left circulation.  He was maintained on Aggrastat and 2 days later on March 27, 2019 he was taken back to the catheterization laboratory and I performed staged complex multi lesion intervention involving the trifurcation stenosis of his distal RCA with Cutting Balloon intervention at all sites and ultimate stenting to the distal RCA into the ostium of the PDA to with a 3.0 x 12 mm Resolute Onyx stent with the 80% stenosis being reduced to 0%, Cutting Balloon to the PDA 1 with the 80% stenosis being reduced to 5%, and Cutting Balloon to the PLA vessel at the ostium being reduced to 5%.  His proximal RCA stent that had been placed 2 days previously had mild narrowing and this was further postdilated to 4.56 mm.  He also underwent successful PCI/DES stenting of the diagonal vessel with insertion of a 2.5 x 15 mm Resolute stent postdilated to 2.75 mm.  He was restarted on Xarelto following his intervention the following day with plans for 1 month of aspirin/Plavix and Xarelto with discontinuance of aspirin after 1 month.  Subsequent to his intervention he has been seen in the office by Randy Cannon April 30, 2019.  At that time, he was advised to discontinue aspirin after 1 month.  He was  started on low-dose lisinopril 2.5 mg.  Patient participated in cardiac rehab following his MI.  He has also been evaluated by his hematologist Dr. Marin Cannon.  Who felt that once he is off Plavix he will then resume full dose Xarelto at 20 mg rather than 15 mg.  The patient has been on atorvastatin 80 mg.  Repeat laboratory in August 2020 showed total cholesterol 174, HDL 32, LDL 116 and triglycerides 131.   The paperwork was initiated for institution of PCSK9 inhibition but this has not yet been completed.    Since his initial office visit with me on July 20, 2019, he was approved with Repatha and he has continued to be on atorvastatin 80 mg, Zetia 10 mg in addition to Reynolds.  He is on levothyroxine 2.5 mg following his MI.  He continues to be on Plavix and Xarelto 15 mg dosing.  He has hypothyroidism on levothyroxine.  Over the past several months, he feels well.  He moved to [place].  He remains completely asymptomatic without chest pain PND orthopnea.  There is no bleeding.  Past Medical History:  Diagnosis Date  . Coronary artery disease   . DVT (deep venous thrombosis) ([place])   . DVT of proximal leg (deep vein thrombosis) ([place]) 09/07/2011  . Factor V Leiden mutation ([place]) 09/07/2011  . Hyperlipidemia   . Thyroid disease     Past Surgical History:  Procedure Laterality Date  . CARDIAC CATHETERIZATION    . CORONARY STENT INTERVENTION N/A 03/27/2019   Procedure: CORONARY STENT INTERVENTION;  Surgeon: Troy Sine, MD;  Location: [facility];  Service: Cardiovascular;  Laterality: N/A;  . CORONARY/GRAFT ACUTE MI REVASCULARIZATION N/A 03/25/2019   Procedure: Coronary/Graft Acute MI Revascularization;  Surgeon: Troy Sine, MD;  Location: [facility];  Service: Cardiovascular;  Laterality: N/A;  . LEFT HEART CATH AND CORONARY ANGIOGRAPHY N/A 03/25/2019   Procedure: LEFT HEART CATH AND CORONARY ANGIOGRAPHY;  Surgeon: Troy Sine, MD;  Location: [facility];  Service: Cardiovascular;  Laterality: N/A;    Current Medications: Outpatient Medications Prior to Visit  Medication Sig Dispense Refill  . acetaminophen (TYLENOL) 325 MG tablet Take 325-650 mg by mouth every 8 (eight) hours as needed for mild pain, moderate pain or headache.     Marland Kitchen atorvastatin (LIPITOR) 80 MG tablet Take 1 tablet (80 mg total) by mouth daily at 6 PM. 90 tablet 3  . cetirizine  (ZYRTEC) 10 MG tablet Take 10 mg by mouth daily as needed (for seasonal allergies).     . clopidogrel (PLAVIX) 75 MG tablet Take 1 tablet (75 mg total) by mouth daily. 90 tablet 3  . Evolocumab (REPATHA SURECLICK) 741 MG/ML SOAJ Inject 140 mg into the skin every 14 (fourteen) days. 2 pen 12  . ezetimibe (ZETIA) 10 MG tablet Take 1 tablet (10 mg total) by mouth daily. 30 tablet 6  . levothyroxine (SYNTHROID) 100 MCG tablet Take 100 mcg by mouth at bedtime.     Marland Kitchen lisinopril (ZESTRIL) 2.5 MG tablet Take 1 tablet (2.5 mg total) by mouth daily. 90 tablet 3  . nitroGLYCERIN (NITROSTAT) 0.4 MG SL tablet Place 1 tablet (0.4 mg total) under the tongue every 5 (five) minutes as needed for chest pain. 25 tablet 3  . Rivaroxaban (XARELTO) 15 MG TABS tablet Take 1 tablet (15 mg total) by mouth daily with supper. 90 tablet 3   No facility-administered medications prior to visit.     Allergies:  Patient has no known allergies.   Social History   Socioeconomic History  . Marital status: Married    Spouse name: Not on file  . Number of children: 1  . Years of education: 93  . Highest education level: Not on file  Occupational History  . Not on file  Tobacco Use  . Smoking status: Former Smoker    Quit date: 2014    Years since quitting: 7.0  . Smokeless tobacco: Current User  Substance and Sexual Activity  . Alcohol use: Yes  . Drug use: Not Currently  . Sexual activity: Not on file  Other Topics Concern  . Not on file  Social History Narrative  . Not on file   Social Determinants of Health   Financial Resource Strain: Low Risk   . Difficulty of Paying Living Expenses: Not hard at all  Food Insecurity: No Food Insecurity  . Worried About Charity fundraiser in the Last Year: Never true  . Ran Out of Food in the Last Year: Never true  Transportation Needs: No Transportation Needs  . Lack of Transportation (Medical): No  . Lack of Transportation (Non-Medical): No  Physical Activity:  Sufficiently Active  . Days of Exercise per Week: 7 days  . Minutes of Exercise per Session: 30 min  Stress: Stress Concern Present  . Feeling of Stress : To some extent  Social Connections:   . Frequency of Communication with Friends and Family: Not on file  . Frequency of Social Gatherings with Friends and Family: Not on file  . Attends Religious Services: Not on file  . Active Member of Clubs or Organizations: Not on file  . Attends Archivist Meetings: Not on file  . Marital Status: Not on file     Family History:  The patient's family history includes Factor V Leiden deficiency in his father; Heart attack (age of onset: 48) in his paternal grandfather; Hyperlipidemia in his father.   ROS General: Negative; No fevers, chills, or night sweats;  HEENT: Negative; No changes in vision or hearing, sinus congestion, difficulty swallowing Pulmonary: Negative; No cough, wheezing, shortness of breath, hemoptysis Cardiovascular: Negative; No chest pain, presyncope, syncope, palpitations GI: Negative; No nausea, vomiting, diarrhea, or abdominal pain GU: Negative; No dysuria, hematuria, or difficulty voiding Musculoskeletal: Negative; no myalgias, joint pain, or weakness Hematologic/Oncology: Heterozygous for factor V deficiency Endocrine: Negative; no heat/cold intolerance; no diabetes Neuro: Negative; no changes in balance, headaches Skin: Negative; No rashes or skin lesions Psychiatric: Negative; No behavioral problems, depression Sleep: Negative; No snoring, daytime sleepiness, hypersomnolence, bruxism, restless legs, hypnogognic hallucinations, no cataplexy Other comprehensive 14 point system review is negative.   PHYSICAL EXAM:   VS:  BP 127/85   Pulse 64   Temp (!) 97.2 F (36.2 C)   Ht '5\' 11"'  (1.803 m)   Wt 233 lb (105.7 kg)   SpO2 99%   BMI 32.50 kg/m     Repeat blood pressure by me was 122/78  Wt Readings from Last 3 Encounters:  10/20/19 233 lb (105.7 kg)    09/03/19 230 lb (104.3 kg)  07/20/19 232 lb (105.2 kg)    General: Alert, oriented, no distress.  Skin: normal turgor, no rashes, warm and dry HEENT: Normocephalic, atraumatic. Pupils equal round and reactive to light; sclera anicteric; extraocular muscles intact;  Nose without nasal septal hypertrophy Mouth/Parynx benign; Mallinpatti scale 3 Neck: No JVD, no carotid bruits; normal carotid upstroke Lungs: clear to ausculatation and percussion; no wheezing  or rales Chest wall: without tenderness to palpitation Heart: PMI not displaced, RRR, s1 s2 normal, 1/6 systolic murmur, no diastolic murmur, no rubs, gallops, thrills, or heaves Abdomen: soft, nontender; no hepatosplenomehaly, BS+; abdominal aorta nontender and not dilated by palpation. Back: no CVA tenderness Pulses 2+ Musculoskeletal: full range of motion, normal strength, no joint deformities Extremities: no clubbing cyanosis or edema, Homan's sign negative  Neurologic: grossly nonfocal; Cranial nerves grossly wnl Psychologic: Normal mood and affect   Studies/Labs Reviewed:   EKG:  EKG is ordered today.  ECG (independently read by me): Normal sinus rhythm at 64 bpm.  Q waves in lead III and aVF.    July 20, 2019 ECG (independently read by me): Sinus bradycardia at 50 bpm with mild sinus arrhythmia.  Small Q-wave in lead III with preserved R waves in lead II small R wave in lead aVF.  Normal intervals.  Recent Labs: BMP Latest Ref Rng & Units 10/16/2019 09/03/2019 04/22/2019  Glucose 65 - 99 mg/dL 86 89 93  BUN 6 - 24 mg/dL '14 13 15  ' Creatinine 0.76 - 1.27 mg/dL 1.01 0.94 1.06  BUN/Creat Ratio 9 - 20 14 - -  Sodium 134 - 144 mmol/L 144 138 139  Potassium 3.5 - 5.2 mmol/L 4.1 4.1 4.0  Chloride 96 - 106 mmol/L 106 103 103  CO2 20 - 29 mmol/L 18(L) 30 28  Calcium 8.7 - 10.2 mg/dL 9.9 9.9 9.2     Hepatic Function Latest Ref Rng & Units 10/16/2019 09/03/2019 05/28/2019  Total Protein 6.0 - 8.5 g/dL 7.5 7.5 7.2  Albumin 4.0 -  5.0 g/dL 5.0 5.1(H) 4.8  AST 0 - 40 IU/L '27 22 26  ' ALT 0 - 44 IU/L 47(H) 49(H) 53(H)  Alk Phosphatase 39 - 117 IU/L 84 75 81  Total Bilirubin 0.0 - 1.2 mg/dL 0.7 0.8 0.7  Bilirubin, Direct 0.00 - 0.40 mg/dL - - 0.18    CBC Latest Ref Rng & Units 10/16/2019 09/03/2019 04/22/2019  WBC 3.4 - 10.8 x10E3/uL 6.9 6.7 6.0  Hemoglobin 13.0 - 17.7 g/dL 17.7 16.1 15.7  Hematocrit 37.5 - 51.0 % 52.1(H) 47.7 47.6  Platelets 150 - 450 x10E3/uL 279 244 231   Lab Results  Component Value Date   MCV 89 10/16/2019   MCV 87.5 09/03/2019   MCV 88.1 04/22/2019   No results found for: TSH Lab Results  Component Value Date   HGBA1C 5.2 04/21/2011     BNP No results found for: BNP  ProBNP No results found for: PROBNP   Lipid Panel     Component Value Date/Time   CHOL 67 (L) 10/20/2019 0838   TRIG 90 10/20/2019 0838   HDL 38 (L) 10/20/2019 0838   CHOLHDL 1.8 10/20/2019 0838   CHOLHDL 4.2 03/25/2019 2217   VLDL 20 03/25/2019 2217   Daniels 11 10/20/2019 0838   LABVLDL 18 10/20/2019 0838     RADIOLOGY: No results found.   Additional studies/ records that were reviewed today include:  I reviewed the patient's June 2020 hospitalization with both his cardiac catheterization, initial intervention and subsequent staged PCI procedures.  Reviewed the recent follow-up evaluation hematologically with Dr. Marin Cannon and evaluation from [facility].   1st RPL lesion is 80% stenosed.  RPDA lesion is 80% stenosed.  Dist RCA lesion is 80% stenosed.  Prox RCA lesion is 100% stenosed.  Ramus lesion is 70% stenosed.  1st Diag lesion is 90% stenosed.  Mid Cx to Dist Cx lesion is 100%  stenosed.  There is mild left ventricular systolic dysfunction.  LV end diastolic pressure is normal.  The left ventricular ejection fraction is 50-55% by visual estimate.  3rd RPL-1 lesion is 60% stenosed.  3rd RPL-2 lesion is 100% stenosed.  A drug-eluting stent was successfully placed.  Post  intervention, there is a 0% residual stenosis.   Acute inferior ST segment elevation myocardial infarction secondary to total occlusion of a very large dominant RCA proximally with extensive thrombus burden.  Multivessel CAD with percent ostial tapering of a long left main; 90% proximal stenosis in a very proximal diagonal branch of the LAD with a normal LAD extending to the apex; 70% proximal stenosis in the ramus intermediate vessel; total occlusion of the mid left circumflex coronary artery with retrograde filling of the distal obtuse marginal branch from LAD collaterals; and total proximal occlusion of a very large dominant RCA with extensive thrombus burden there is some faint collateralization to the PDA a and PLA vessels from the left coronary circulation.  Difficult but successful PCI to the large RCA requiring PTCA, extensive thrombectomy, Aggrastat with reperfusion induced hypotension and bradycardia necessitating significant fluid resuscitation, atropine and Levophed with ultimate insertion of a 4.0 x 22 mm Resolute Onyx DES stent postdilated to 4.4 mm with the 100% occlusion being reduced to 0% and resumption of brisk TIMI-3 flow.  There is a very complex 80% trifurcation stenosis in the distal RCA at the takeoff of the PDA large inferior LV branch and continuation branch with thrombus in the mid PLA just collateralized from the left circulation.  Mild acute LV dysfunction with inferobasal hypocontractility.  RECOMMENDATION: The patient left the catheterization laboratory on Cangrelor which will be discontinued in the CCU with subsequent administration of Plavix 600 mg daily.  Patient will be maintained on Aggrastat for at least 24 hours and possibly until Friday a.m. prior to subsequent planned intervention to the complex trifurcation distal LAD stenosis and possibly the diagonal and ramus vessels.  Will ask colleagues to review. At present Xarelto will be held but ultimately he will need  to be on triple drug therapy with aspirin/Plavix and Xarelto for 1 month and then Plavix/Xarelto.  Plan echo Doppler in a.m.  Titrate to maximum statin dose.  With the patient's history of significant hyperlipidemia at a young age consider possible familial hyperlipidemia and if LDL cannot reach target would initiate PCSK9 inhibition.   Intervention    1st Diag lesion is 90% stenosed.  Mid Cx to Dist Cx lesion is 100% stenosed.  1st RPL lesion is 80% stenosed.  RPDA lesion is 80% stenosed.  Dist RCA lesion is 80% stenosed.  Ramus lesion is 70% stenosed.  Post intervention, there is a 10% residual stenosis.  Previously placed Prox RCA-2 drug eluting stent is widely patent.  Balloon angioplasty was performed.  Prox RCA-1 lesion is 20% stenosed.  Post intervention, there is a 0% residual stenosis.  Post intervention, there is a 0% residual stenosis.  Post intervention, there is a 0% residual stenosis.  Post intervention, there is a 0% residual stenosis.  3rd RPL-1 lesion is 80% stenosed.  Post intervention, there is a 50% residual stenosis.  3rd RPL-2 lesion is 80% stenosed.  A stent was successfully placed.  A stent was successfully placed.  A stent was successfully placed.   Successful multi lesion intervention evolving a trifurcation stenosis of the distal RCA, ostial PDA1, ostial PDA2 and ostial PLA vessel with Cutting Balloon intervention at all sites and ultimate stenting  of the distal RCA into the ostium of the PDA2 with a 3.0 x 12 mm Resolute Onyx stent with the 80% stenoses being reduced to 0%; Cutting Balloon to the PDA 1 with the 80% stenosis reduced to 5%.  Following cutting balloon to the PLA vessel the ostial lesion was reduced to less than 5% but following stenting into the PDA 2 vessel there was some ostial narrowing of the PLA vessel 50 to 60%.  Repeat noncompliant dilatation in the proximal portion of the previously placed 4.0 x 22 mm Resolute stent  which was additionally postdilated today to 4.56 mm with improvement in the mild digital narrowing to 0%.  Successful PCI/DES stenting of the diagonal vessel with insertion of a 2.5 x 15 mm Resolute stent postdilated to 2.75 mm with the 80% stenosis being reduced to 0%.  RECOMMENDATION; The patient will be restarted on Xarelto tomorrow and for the first month continue aspirin/Plavix and Xarelto with discontinuance of aspirin after 1 month.  Adequate therapy for the patient's totally occluded left circumflex vessel with left to left collaterals as well as the ramus intermediate vessel.  65 to 70% stenosed.  Aggressive lipid-lowering therapy with target LDL less than 70 and if he cannot achieve this would recommend PCSK9 inhibition for aggressive treatment.   Intervention      ASSESSMENT:    1. Hypothyroidism, unspecified type   2. STEMI involving right coronary artery ([place]): 03/25/2019 treated with DES stenting to the proximal RCA.   3. Coronary artery disease involving native coronary artery of native heart without angina pectoris: Is post staged PCI to trifurcation distal RCA stenosis (PDA, PDA 2, PLA vessel) and DES to Dx.     4. Hyperlipidemia LDL goal <70   5. Factor V Leiden mutation [facility])     PLAN:  Randy Cannon is an active 44 year old gentleman who has a history of heterozygous factor V deficiency and subsequently has been on lifelong anticoagulation following significant prior episodes of DVTs in the past.  Despite being on anticoagulation, he developed severe new onset chest pain while biking on a mountain bike on March 25, 2019 and was found to have acute inferior ST segment elevation myocardial infarction secondary to total occlusion of a very large dominant RCA proximally.  There was extensive thrombus burden.  He had significant concomitant CAD.  I again reviewed his catheterization findings on initial intervention as well as his subsequent staged PCI to his trifurcation  stenosis in his distal RCA and stenting of his diagonal vessel 2 days later on March 27, 2019.  Allowing his interventions, he has been asymptomatic with reference to chest pain and feels very well.  He had significant hyperlipidemia and has been on atorvastatin 80 mg with recent LDL cholesterol is still increased at 116.  At his initial office visit with me I added Zetia 10 mg to his atorvastatin 80 mg and subsequently he has been approved for Repatha and is now on treatment.  He has not yet had a follow-up lipid panel with PCSK9 inhibition and fasting lipid studies will be done.  With his ACS in June 2020 with significant thrombus burden spite being on anticoagulation, I have recommended continuation of Plavix/Xarelto for a total of 12 months.  At that time, in June I will most likely discontinue clopidogrel with resumption of aspirin 81 mg and Dr. Marin Cannon plans to further increase his Xarelto to 20 mg.  His blood pressure today is stable on current therapy.  He continues  to be on levothyroxine for hypothyroidism.  I will see him in June 2021 follow-up evaluation.  Time spent: 25 minutes  Medication Adjustments/Labs and Tests Ordered: Current medicines are reviewed at length with the patient today.  Concerns regarding medicines are outlined above.  Medication changes, Labs and Tests ordered today are listed in the Patient Instructions below. Patient Instructions  Medication Instructions:  Continue current medications  *If you need a refill on your cardiac medications before your next appointment, please call your pharmacy*  Lab Work: Fasting Lipids  If you have labs (blood work) drawn today and your tests are completely normal, you will receive your results only by: Marland Kitchen MyChart Message (if you have MyChart) OR . A paper copy in the mail If you have any lab test that is abnormal or we need to change your treatment, we will call you to review the results.  Testing/Procedures: None  Ordered  Follow-Up: At [facility], you and your health needs are our priority.  As part of our continuing mission to provide you with exceptional heart care, we have created designated Provider Care Teams.  These Care Teams include your primary Cardiologist (physician) and Advanced Practice Providers (APPs -  Physician Assistants and Nurse Practitioners) who all work together to provide you with the care you need, when you need it.  Your next appointment:   6 month(s)  The format for your next appointment:   In Person  Provider:   Shelva Majestic, MD       Signed, Randy Majestic, MD  10/22/2019 1:37 PM    [facility] [address] [place], [place], [place]  [address] Phone: [phone]

## 2019-10-22 ENCOUNTER — Encounter: Payer: Self-pay | Admitting: Cardiovascular Disease

## 2019-10-29 ENCOUNTER — Other Ambulatory Visit: Payer: Self-pay | Admitting: Cardiovascular Disease

## 2019-11-21 DIAGNOSIS — R519 Headache, unspecified: Secondary | ICD-10-CM | POA: Diagnosis not present

## 2019-12-05 IMAGING — DX PORTABLE CHEST - 1 VIEW
1 series · 2 of 2 positions shown · non-contrast
Comparison: 04/22/2011

CLINICAL DATA: Code STEMI

EXAM:
PORTABLE CHEST 1 VIEW

[Series 1: chest · 0.14mm/px · 2 of 2 slices shown]
[im 1/2]
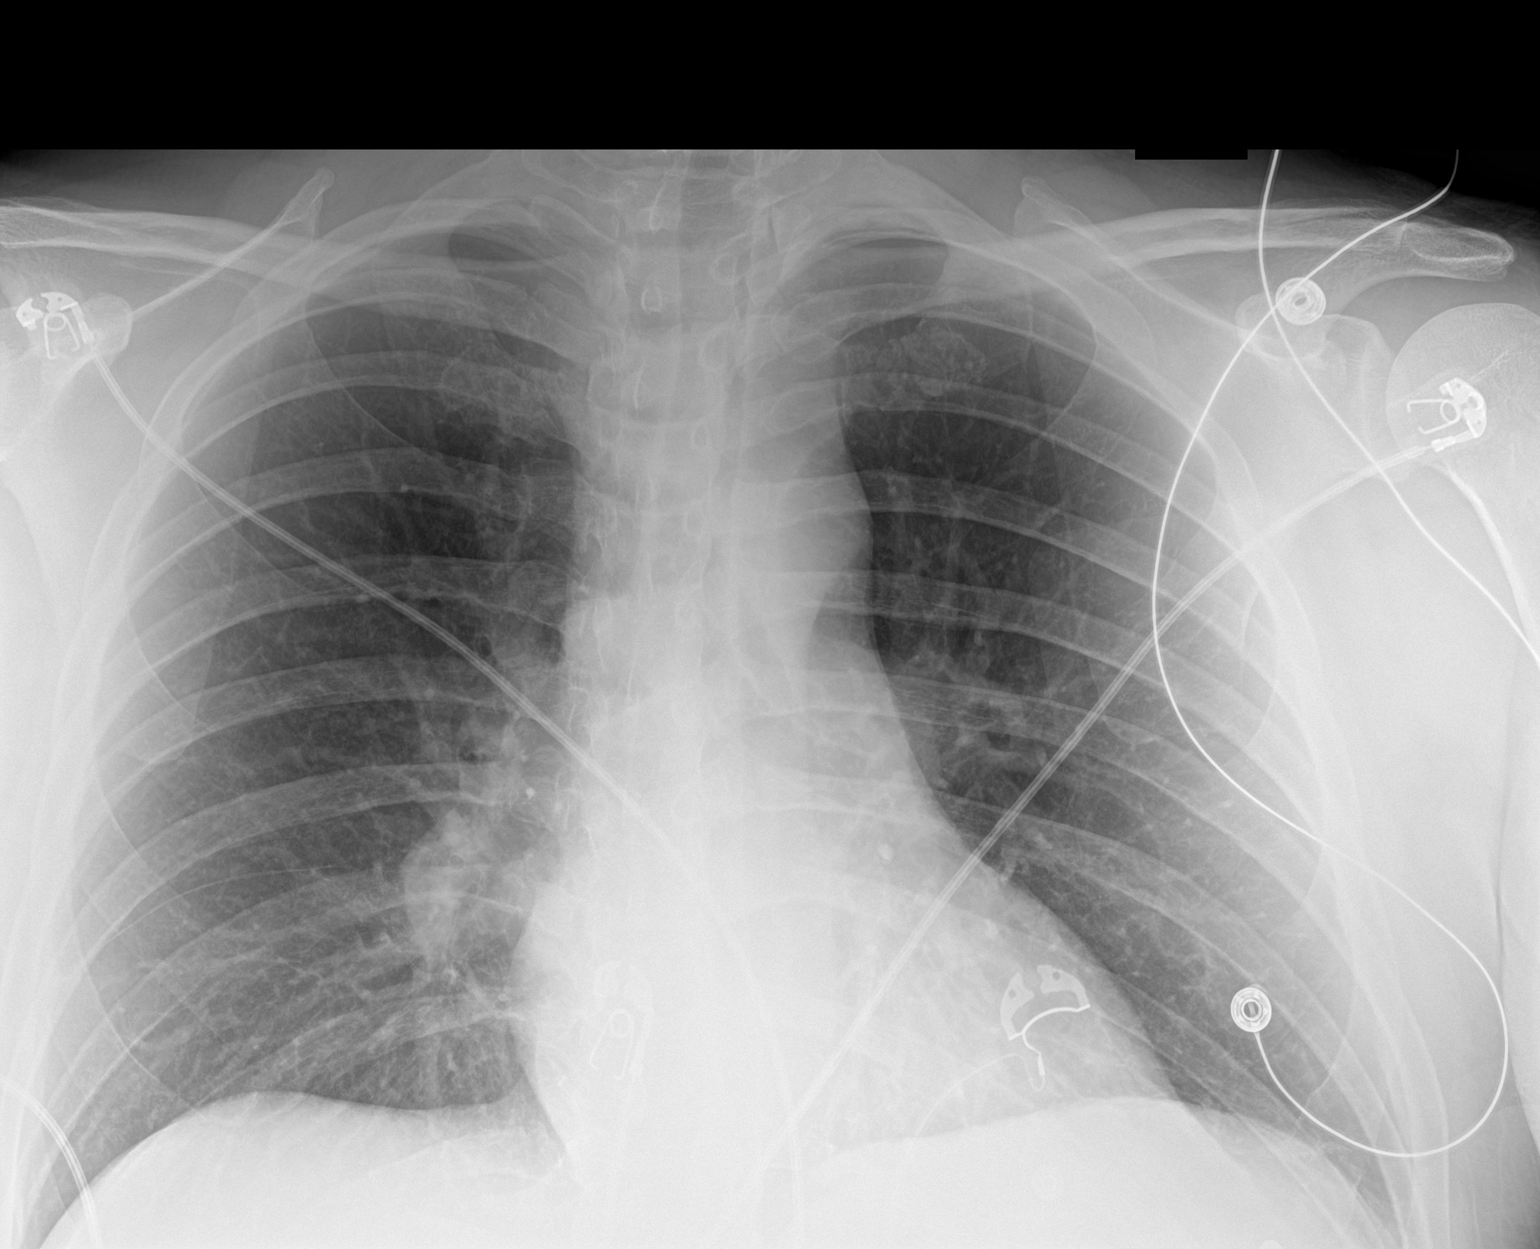
[im 2/2]
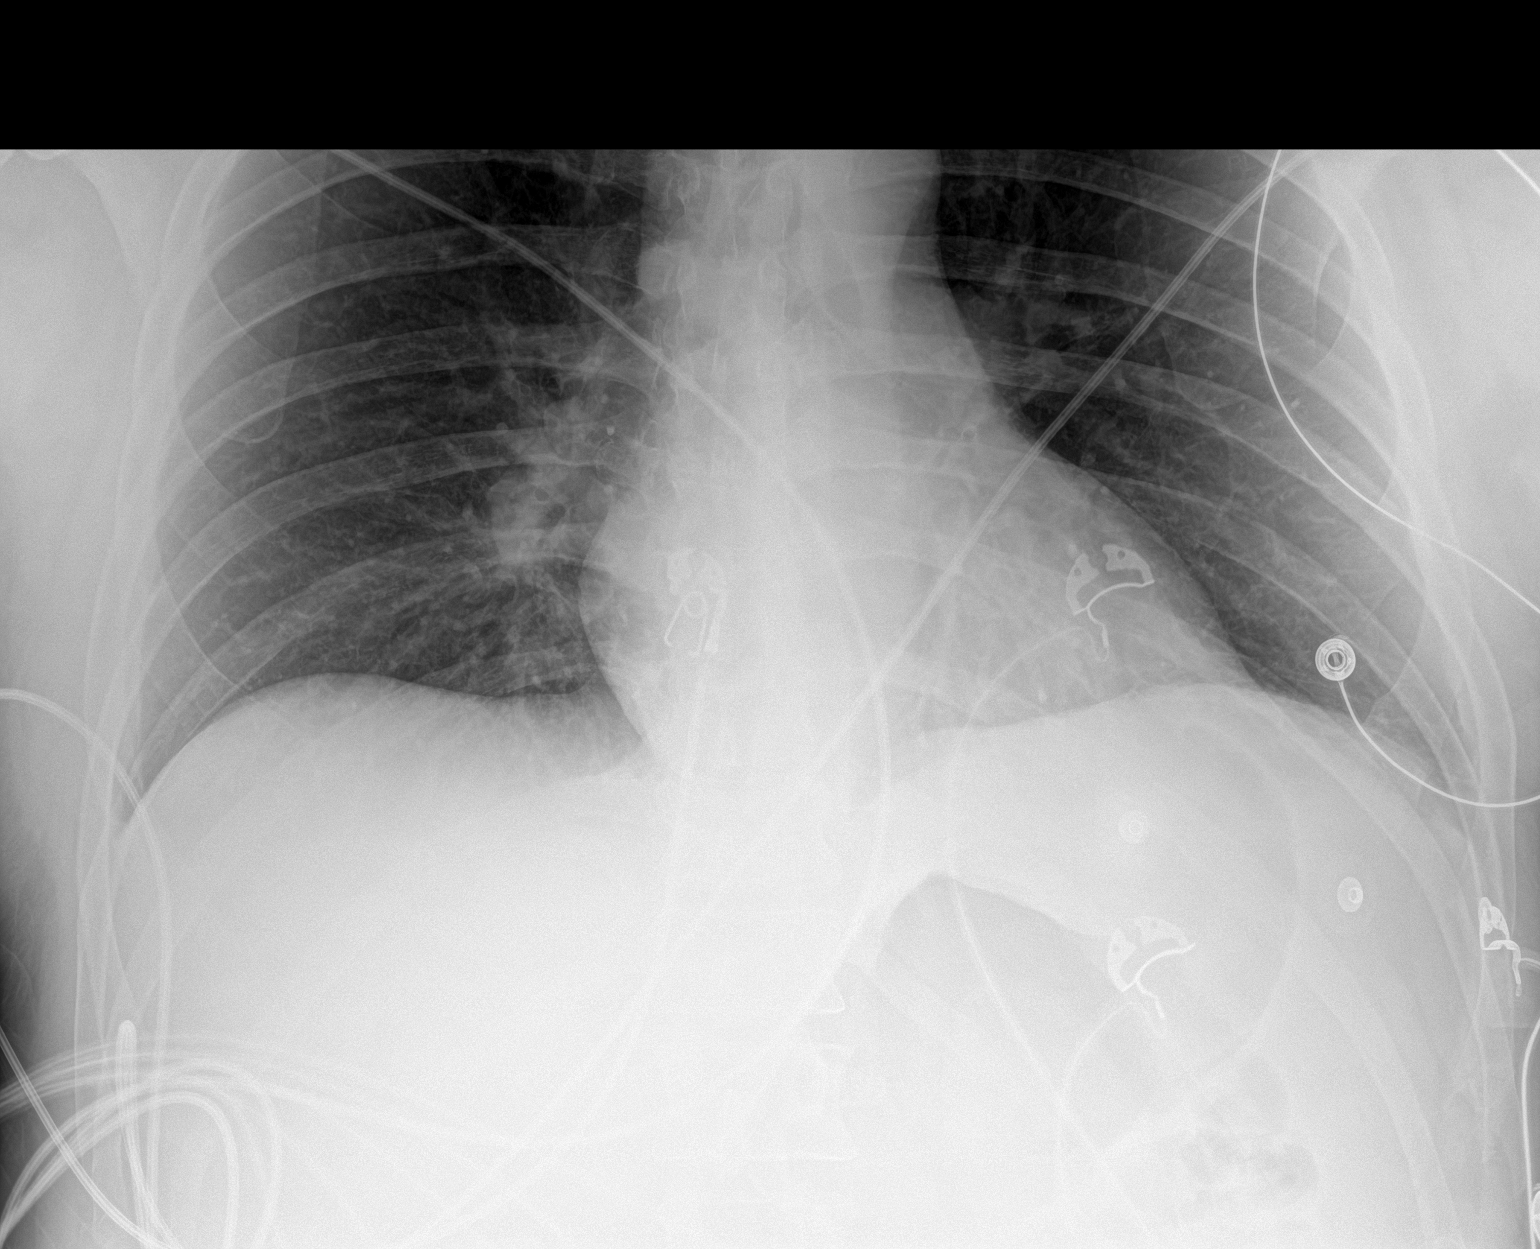

[2 of 2 positions shown; findings below may reference images not displayed]

FINDINGS: The heart size and mediastinal contours are within normal limits.
Both lungs are clear. The visualized skeletal structures are
unremarkable.
IMPRESSION: No active disease.

## 2019-12-24 ENCOUNTER — Other Ambulatory Visit: Payer: Self-pay | Admitting: Physician Assistant

## 2020-01-06 ENCOUNTER — Telehealth: Payer: Self-pay | Admitting: Cardiovascular Disease

## 2020-01-06 ENCOUNTER — Other Ambulatory Visit: Payer: Self-pay

## 2020-01-06 MED ORDER — CLOPIDOGREL BISULFATE 75 MG PO TABS: 75.0000 mg | ORAL_TABLET | Freq: Every day | ORAL | 3 refills | Status: DC

## 2020-01-06 NOTE — Telephone Encounter (Signed)
Pt calling in because he was informed by his pharmacy that his insurance company will no longer pay for his Xarelto. He thinks it is due to cost. He has not changed insurance companies in several years and has been on Xarelto for 10+ years. The patient states his pharmacy suggested switching to Eliquis or Pradaxa instead.   Will send to Dr. Tresa Endo to advise.   Refilled patients Plavix Rx.

## 2020-01-06 NOTE — Telephone Encounter (Signed)
New message   Patient has questions about the Rivaroxaban (XARELTO) 15 MG TABS tablet and clopidogrel (PLAVIX) 75 MG tablet. Please call to discuss.

## 2020-01-10 NOTE — Telephone Encounter (Signed)
If they will not if they will not cover Xarelto, okay to switch to Eliquis

## 2020-01-11 NOTE — Telephone Encounter (Signed)
Eliquis 5mg  twice daily for recurrent VTE and factor V Leiden Wt 105kg 43yo Scr = 1.01

## 2020-01-11 NOTE — Telephone Encounter (Signed)
Called and spoke with pt to notify of pharmacist's recommendations. Pt states he is concerned with switching medications due to his Leiden factor V.  He states that he feels like he is the middle man and he does not want to miss anything. He states he trusts [facility] and our Pharmacist, but he would like for [facility] and his Hematologist Dr.Peter Ennever to discuss this possible medication change.  He states he does not want to "go down the road and in a few years have another clot because I switched to Eliquis and didn't stay on xarelto." Notified that I would send this message to Dr.Kelly to review. Pt verbalized understanding with no other questions at this time.

## 2020-01-13 NOTE — Telephone Encounter (Signed)
Will forward to Dr. Myna Hidalgo to get his input but I suspect he will agree that Eliquis is appropriate if his insurance company will no longer cover Xarelto.

## 2020-01-18 ENCOUNTER — Other Ambulatory Visit: Payer: Self-pay | Admitting: Cardiovascular Disease

## 2020-01-18 NOTE — Telephone Encounter (Signed)
° ° °*  STAT* If patient is at the pharmacy, call can be transferred to refill team.   1. Which medications need to be refilled? (please list name of each medication and dose if known)  Rivaroxaban (XARELTO) 15 MG TABS tablet     2. Which pharmacy/location (including street and city if local pharmacy) [facility]   3. Do they need a 30 day or 90 day supply? 90 days  Randy Cannon from [place], she provide [phone] to call. Also she said pt ran out of xarelto and needs another sample until [facility]

## 2020-01-18 NOTE — Telephone Encounter (Signed)
Spoke with patient. Patient reports Raynelle Fanning from [facility] told her that he needs a prior auth and the medication will be covered. Patient report with the prior auth it should be approved and his medication will not need to be changed. Prior Berkley Harvey number is [phone].  2 weeks worth of samples left at front desk for patient. Patient aware.   Will route to primary nurse to complete PA.

## 2020-01-19 NOTE — Telephone Encounter (Signed)
PA for Xarelto 15mg  sent to plan on 01/19/20

## 2020-01-21 DIAGNOSIS — E039 Hypothyroidism, unspecified: Secondary | ICD-10-CM | POA: Diagnosis not present

## 2020-01-21 DIAGNOSIS — Z0000 Encounter for general adult medical examination without abnormal findings: Secondary | ICD-10-CM | POA: Diagnosis not present

## 2020-01-27 NOTE — Telephone Encounter (Signed)
Raynelle Fanning with [facility] is calling for PA for Xarelto.  Please call [facility] for the PA at [phone]

## 2020-01-27 NOTE — Telephone Encounter (Signed)
Called and spoke with Diane PA representative: PA approved for Xarelto 15mg  tabs from 12/28/19-01/26/21 Insurance to notify pt of approval.

## 2020-02-04 ENCOUNTER — Other Ambulatory Visit: Payer: Self-pay

## 2020-02-04 MED ORDER — RIVAROXABAN 15 MG PO TABS: 15.0000 mg | ORAL_TABLET | Freq: Every day | ORAL | 3 refills | Status: DC

## 2020-03-03 ENCOUNTER — Other Ambulatory Visit: Payer: Self-pay

## 2020-03-03 ENCOUNTER — Inpatient Hospital Stay: Payer: BC Managed Care – PPO | Attending: Hematology & Oncology

## 2020-03-03 ENCOUNTER — Inpatient Hospital Stay (HOSPITAL_BASED_OUTPATIENT_CLINIC_OR_DEPARTMENT_OTHER): Payer: BC Managed Care – PPO | Admitting: Hematology & Oncology

## 2020-03-03 ENCOUNTER — Encounter: Payer: Self-pay | Admitting: Hematology & Oncology

## 2020-03-03 VITALS — BP 118/71 | HR 55 | Temp 96.8°F | Resp 16 | Wt 236.0 lb

## 2020-03-03 DIAGNOSIS — I2510 Atherosclerotic heart disease of native coronary artery without angina pectoris: Secondary | ICD-10-CM | POA: Diagnosis not present

## 2020-03-03 DIAGNOSIS — Z7902 Long term (current) use of antithrombotics/antiplatelets: Secondary | ICD-10-CM | POA: Diagnosis not present

## 2020-03-03 DIAGNOSIS — D6851 Activated protein C resistance: Secondary | ICD-10-CM

## 2020-03-03 DIAGNOSIS — Z7901 Long term (current) use of anticoagulants: Secondary | ICD-10-CM | POA: Diagnosis not present

## 2020-03-03 DIAGNOSIS — I252 Old myocardial infarction: Secondary | ICD-10-CM | POA: Insufficient documentation

## 2020-03-03 DIAGNOSIS — I2111 ST elevation (STEMI) myocardial infarction involving right coronary artery: Secondary | ICD-10-CM | POA: Diagnosis not present

## 2020-03-03 DIAGNOSIS — R5383 Other fatigue: Secondary | ICD-10-CM | POA: Diagnosis not present

## 2020-03-03 DIAGNOSIS — Z79899 Other long term (current) drug therapy: Secondary | ICD-10-CM | POA: Insufficient documentation

## 2020-03-03 DIAGNOSIS — I25110 Atherosclerotic heart disease of native coronary artery with unstable angina pectoris: Secondary | ICD-10-CM

## 2020-03-03 LAB — CMP (CANCER CENTER ONLY)
ALT: 56 U/L — ABNORMAL HIGH (ref 0–44)
AST: 27 U/L (ref 15–41)
Albumin: 4.7 g/dL (ref 3.5–5.0)
Alkaline Phosphatase: 70 U/L (ref 38–126)
Anion gap: 6 (ref 5–15)
BUN: 13 mg/dL (ref 6–20)
CO2: 29 mmol/L (ref 22–32)
Calcium: 10 mg/dL (ref 8.9–10.3)
Chloride: 104 mmol/L (ref 98–111)
Creatinine: 0.94 mg/dL (ref 0.61–1.24)
GFR, Est AFR Am: >60 mL/min (ref >60)
GFR, Estimated: >60 mL/min (ref >60)
Glucose, Bld: 98 mg/dL (ref 70–99)
Potassium: 4.3 mmol/L (ref 3.5–5.1)
Sodium: 139 mmol/L (ref 135–145)
Total Bilirubin: 0.7 mg/dL (ref 0.3–1.2)
Total Protein: 7.3 g/dL (ref 6.5–8.1)

## 2020-03-03 LAB — CBC WITH DIFFERENTIAL (CANCER CENTER ONLY)
Abs Immature Granulocytes: 0.01 10*3/uL (ref 0.00–0.07)
Basophils Absolute: 0 10*3/uL (ref 0.0–0.1)
Basophils Relative: 1 %
Eosinophils Absolute: 0.1 10*3/uL (ref 0.0–0.5)
Eosinophils Relative: 2 %
HCT: 46.9 % (ref 39.0–52.0)
Hemoglobin: 16 g/dL (ref 13.0–17.0)
Immature Granulocytes: 0 %
Lymphocytes Relative: 34 %
Lymphs Abs: 2.1 10*3/uL (ref 0.7–4.0)
MCH: 29.7 pg (ref 26.0–34.0)
MCHC: 34.1 g/dL (ref 30.0–36.0)
MCV: 87 fL (ref 80.0–100.0)
Monocytes Absolute: 0.6 10*3/uL (ref 0.1–1.0)
Monocytes Relative: 10 %
Neutro Abs: 3.4 10*3/uL (ref 1.7–7.7)
Neutrophils Relative %: 53 %
Platelet Count: 263 10*3/uL (ref 150–400)
RBC: 5.39 MIL/uL (ref 4.22–5.81)
RDW: 12.2 % (ref 11.5–15.5)
WBC Count: 6.3 10*3/uL (ref 4.0–10.5)
nRBC: 0 % (ref 0.0–0.2)

## 2020-03-03 NOTE — Progress Notes (Signed)
The first time I heard  Hematology and Oncology Follow Up Visit  Randy Cannon [id number] 1976/08/18 44 y.o. 03/03/2020   Principle Diagnosis:   Factor V Leiden mutation-heterozygous  Coronary artery disease-status post myocardial infarctions with stents  Current Therapy:    Xarelto 15 mg p.o. daily  Plavix 75 mg p.o. daily       Interim History:  [name] is back for a follow-up.  He looks fantastic.  We saw him 6 months ago.  Since then, he has had no complaints.  He really has done nicely.  I must say that the cholesterol is incredibly low.  He continues on Repatha.  I think he also might be taking Lipitor.  He is on Plavix.  I think this can be stopped by cardiology as it has been 1 year.  He is on Xarelto.  He is doing well on the Xarelto.  I would not change the Xarelto dose.  He and his family are planning on going to [facility] I think in July.  I am sure he will have a wonderful time out there.  He is working without difficulty.  He has a new house.  He is busy trying to get the house in order.  He has had no bleeding.  He has had no chest wall pain.  He has had no fever.  He has had no issues with the coronavirus.  Overall, his performance status is ECOG 0.    Medications:  Current Outpatient Medications:  .  acetaminophen (TYLENOL) 325 MG tablet, Take 325-650 mg by mouth every 8 (eight) hours as needed for mild pain, moderate pain or headache. , Disp: , Rfl:  .  atorvastatin (LIPITOR) 80 MG tablet, Take 1 tablet (80 mg total) by mouth daily at 6 PM., Disp: 90 tablet, Rfl: 3 .  cetirizine (ZYRTEC) 10 MG tablet, Take 10 mg by mouth daily as needed (for seasonal allergies). , Disp: , Rfl:  .  clopidogrel (PLAVIX) 75 MG tablet, Take 1 tablet (75 mg total) by mouth daily., Disp: 90 tablet, Rfl: 3 .  Evolocumab (REPATHA SURECLICK) 409 MG/ML SOAJ, Inject 140 mg into the skin every 14 (fourteen) days., Disp: 2 pen, Rfl: 12 .  levothyroxine (SYNTHROID)  112 MCG tablet, Take 112 mcg by mouth every morning., Disp: , Rfl:  .  lisinopril (ZESTRIL) 2.5 MG tablet, TAKE 1 TABLET(2.5 MG) BY MOUTH DAILY, Disp: 90 tablet, Rfl: 3 .  nitroGLYCERIN (NITROSTAT) 0.4 MG SL tablet, Place 1 tablet (0.4 mg total) under the tongue every 5 (five) minutes as needed for chest pain., Disp: 25 tablet, Rfl: 3 .  Rivaroxaban (XARELTO) 15 MG TABS tablet, Take 1 tablet (15 mg total) by mouth daily with supper., Disp: 90 tablet, Rfl: 3  Allergies: No Known Allergies  Past Medical History, Surgical history, Social history, and Family History were reviewed and updated.  Review of Systems: Review of Systems  Constitutional: Positive for fatigue.  HENT:  Negative.   Eyes: Negative.   Respiratory: Negative.   Cardiovascular: Negative.   Gastrointestinal: Negative.   Endocrine: Negative.   Genitourinary: Negative.    Musculoskeletal: Negative.   Skin: Negative.   Neurological: Negative.   Hematological: Negative.   Psychiatric/Behavioral: Negative.     Physical Exam:  weight is 236 lb (107 kg). His temporal temperature is 96.8 F (36 C) (abnormal). His blood pressure is 118/71 and his pulse is 55 (abnormal). His respiration is 16 and oxygen saturation is 98%.  Wt Readings from Last 3 Encounters:  03/03/20 236 lb (107 kg)  10/20/19 233 lb (105.7 kg)  09/03/19 230 lb (104.3 kg)    Physical Exam Vitals reviewed.  HENT:     Head: Normocephalic and atraumatic.  Eyes:     Pupils: Pupils are equal, round, and reactive to light.  Cardiovascular:     Rate and Rhythm: Normal rate and regular rhythm.     Heart sounds: Normal heart sounds.  Pulmonary:     Effort: Pulmonary effort is normal.     Breath sounds: Normal breath sounds.  Abdominal:     General: Bowel sounds are normal.     Palpations: Abdomen is soft.  Musculoskeletal:        General: No tenderness or deformity. Normal range of motion.     Cervical back: Normal range of motion.  Lymphadenopathy:      Cervical: No cervical adenopathy.  Skin:    General: Skin is warm and dry.     Findings: No erythema or rash.  Neurological:     Mental Status: He is alert and oriented to person, place, and time.  Psychiatric:        Behavior: Behavior normal.        Thought Content: Thought content normal.        Judgment: Judgment normal.      Lab Results  Component Value Date   WBC 6.3 03/03/2020   HGB 16.0 03/03/2020   HCT 46.9 03/03/2020   MCV 87.0 03/03/2020   PLT 263 03/03/2020     Chemistry      Component Value Date/Time   NA 139 03/03/2020 1156   NA 144 10/16/2019 1051   K 4.3 03/03/2020 1156   CL 104 03/03/2020 1156   CO2 29 03/03/2020 1156   BUN 13 03/03/2020 1156   BUN 14 10/16/2019 1051   CREATININE 0.94 03/03/2020 1156      Component Value Date/Time   CALCIUM 10.0 03/03/2020 1156   ALKPHOS 70 03/03/2020 1156   AST 27 03/03/2020 1156   ALT 56 (H) 03/03/2020 1156   BILITOT 0.7 03/03/2020 1156       Impression and Plan: [name] is a 44 year old white male.  He has a history of factor V Leiden mutation.  He subsequently developed a myocardial infarction.  He is on highly aggressive therapy now.  He is on both Plavix and Xarelto.  I think he is going to need lifelong Xarelto.  I am just glad that he is doing so well right now.  We will plan to get him back in 6 months.  I look forward to seeing him back.  He will bring back pictures of [place].   Josph Macho, MD 6/3/202112:47 PM

## 2020-03-04 LAB — IRON AND TIBC
Iron: 119 ug/dL (ref 42–163)
Saturation Ratios: 38 % (ref 20–55)
TIBC: 314 ug/dL (ref 202–409)
UIBC: 194 ug/dL (ref 117–376)

## 2020-03-04 LAB — FERRITIN: Ferritin: 536 ng/mL — ABNORMAL HIGH (ref 24–336)

## 2020-03-31 ENCOUNTER — Telehealth: Payer: Self-pay | Admitting: Cardiovascular Disease

## 2020-03-31 NOTE — Telephone Encounter (Signed)
Takes Lisinopril 2.5 mg daily and thought this was going to be d/c after 1 year Reviewed notes and looks like Plavix was to be d/c after 1 year  Dry cough for a couple of months.  Does have issues with allergies but does not last this long. Usually has more issues with eyes when his allergies are bothering him  Takes Claritin or Zyrtec daily   1) Continue Plavix until seen in August?  2) Continue Lisinopril or change?  Will forward to Dr Tresa Endo for review

## 2020-03-31 NOTE — Telephone Encounter (Signed)
New message    Patient wants to know if he should continue to take lisinopril (ZESTRIL) 2.5 MG tablet. Please call.

## 2020-04-01 ENCOUNTER — Telehealth: Payer: Self-pay | Admitting: Cardiovascular Disease

## 2020-04-01 NOTE — Telephone Encounter (Signed)
Left message to call back  

## 2020-04-01 NOTE — Telephone Encounter (Signed)
As per my last note, he can DC Plavix after 1 year and per Dr. Myna Hidalgo Xarelto dose will be increased to 20 mg with resumption of 81 mg aspirin.  If he has a dry hacking cough, DC lisinopril and change to losartan 25 mg daily.

## 2020-04-01 NOTE — Telephone Encounter (Signed)
LVM for patient to return call to get follow up visit scheduled from patients recall list.

## 2020-04-05 MED ORDER — LOSARTAN POTASSIUM 25 MG PO TABS: 25.0000 mg | ORAL_TABLET | Freq: Every day | ORAL | 3 refills | Status: DC

## 2020-04-05 NOTE — Telephone Encounter (Signed)
Called patient, he states it has been over a year now- since June of last year.  Just to clarify- okay to increase Xarelto to 20 mg, start baby asa. And stop plavix.    Other medication sent- and patient made aware to stop lisinopril.   Thanks!

## 2020-04-11 NOTE — Telephone Encounter (Signed)
Okay as stated

## 2020-04-12 MED ORDER — RIVAROXABAN 20 MG PO TABS: 20.0000 mg | ORAL_TABLET | Freq: Every day | ORAL | 3 refills | Status: DC

## 2020-04-12 NOTE — Telephone Encounter (Signed)
Called and spoke with pt, notified he is to increase his does of xarelto to 20mg , start taking a baby aspirin (notified he can get OTC), and stop his plavix. Pt states he is out of town right now and will make these changes when he returns on Friday, but will continue the current dose of xarelto and plavix until then. Notified this was fine and I would go ahead and send the increased dose of Xarelto. Pt verbalized understanding and was thankful for the call. No other questions at this time.

## 2020-04-12 NOTE — Addendum Note (Signed)
Addended by: Marye Round on: 04/12/2020 05:17 PM   Modules accepted: Orders

## 2020-04-22 ENCOUNTER — Telehealth: Payer: Self-pay | Admitting: *Deleted

## 2020-04-22 MED ORDER — ATORVASTATIN CALCIUM 80 MG PO TABS: 80.0000 mg | ORAL_TABLET | Freq: Every day | ORAL | 3 refills | Status: DC

## 2020-04-22 NOTE — Telephone Encounter (Signed)
Received call from patient (via medical records) requesting new rx for atorvastatin.    Rx sent to pharmacy.  Patient aware

## 2020-05-26 ENCOUNTER — Other Ambulatory Visit: Payer: Self-pay

## 2020-05-26 ENCOUNTER — Ambulatory Visit (INDEPENDENT_AMBULATORY_CARE_PROVIDER_SITE_OTHER): Payer: BC Managed Care – PPO | Admitting: Cardiovascular Disease

## 2020-05-26 ENCOUNTER — Encounter: Payer: Self-pay | Admitting: Cardiovascular Disease

## 2020-05-26 DIAGNOSIS — I2510 Atherosclerotic heart disease of native coronary artery without angina pectoris: Secondary | ICD-10-CM | POA: Diagnosis not present

## 2020-05-26 DIAGNOSIS — D6851 Activated protein C resistance: Secondary | ICD-10-CM

## 2020-05-26 DIAGNOSIS — R001 Bradycardia, unspecified: Secondary | ICD-10-CM

## 2020-05-26 DIAGNOSIS — E785 Hyperlipidemia, unspecified: Secondary | ICD-10-CM | POA: Diagnosis not present

## 2020-05-26 DIAGNOSIS — E039 Hypothyroidism, unspecified: Secondary | ICD-10-CM

## 2020-05-26 DIAGNOSIS — I2111 ST elevation (STEMI) myocardial infarction involving right coronary artery: Secondary | ICD-10-CM | POA: Diagnosis not present

## 2020-05-26 DIAGNOSIS — E669 Obesity, unspecified: Secondary | ICD-10-CM

## 2020-05-26 LAB — COMPREHENSIVE METABOLIC PANEL
ALT: 65 IU/L — ABNORMAL HIGH (ref 0–44)
AST: 31 IU/L (ref 0–40)
Albumin/Globulin Ratio: 2.5 — ABNORMAL HIGH (ref 1.2–2.2)
Albumin: 5 g/dL (ref 4.0–5.0)
Alkaline Phosphatase: 73 IU/L (ref 48–121)
BUN/Creatinine Ratio: 12 (ref 9–20)
BUN: 12 mg/dL (ref 6–24)
Bilirubin Total: 0.6 mg/dL (ref 0.0–1.2)
CO2: 23 mmol/L (ref 20–29)
Calcium: 9.9 mg/dL (ref 8.7–10.2)
Chloride: 105 mmol/L (ref 96–106)
Creatinine, Ser: 0.98 mg/dL (ref 0.76–1.27)
GFR calc Af Amer: 107 mL/min/{1.73_m2} (ref >59)
GFR calc non Af Amer: 93 mL/min/{1.73_m2} (ref >59)
Globulin, Total: 2 g/dL (ref 1.5–4.5)
Glucose: 86 mg/dL (ref 65–99)
Potassium: 4.8 mmol/L (ref 3.5–5.2)
Sodium: 142 mmol/L (ref 134–144)
Total Protein: 7 g/dL (ref 6.0–8.5)

## 2020-05-26 LAB — LIPID PANEL
Chol/HDL Ratio: 3.2 ratio (ref 0.0–5.0)
Cholesterol, Total: 126 mg/dL (ref 100–199)
HDL: 39 mg/dL — ABNORMAL LOW (ref >39)
LDL Chol Calc (NIH): 71 mg/dL (ref 0–99)
Triglycerides: 80 mg/dL (ref 0–149)
VLDL Cholesterol Cal: 16 mg/dL (ref 5–40)

## 2020-05-26 NOTE — Progress Notes (Signed)
Cardiology Office Note    Date:  05/28/2020   ID:  Randy, Cannon 1976/05/11, MRN [id number]  PCP:  Randy Lass, MD  Cardiologist:  Randy Majestic, MD   History of Present Illness:  Randy Cannon is a 44 y.o. male who presents for a 80-monthfollow-up cardiology evaluation.   [name] a history of heterozygous factor V Leiden deficiency, remote DVTs in 2006 in 2012 with IVC filter and who had been on chronic Xarelto anticoagulation.  He has a longstanding history of hyperlipidemia with high cholesterols at a young age suggesting possible familial hyperlipidemia.  He denied any prior history of chest pain but on March 25, 2019 while [place] a mountain bike [place] difficult trail developed severe chest pain, required EMS [place] [facility] depression and a code STEMI was activated.  I performed emergent cardiac catheterization in the setting of his acute inferior ST elevation MI which was secondary to total occlusion of a very large dominant RCA approximately with extensive thrombus burden.  He was also found to have multivessel CAD with mild ostial tapering of a long left main, 90% proximal stenosis in a very proximal diagonal branch of his LAD, 70% proximal stenosis in the ramus intermediate vessel, total occlusion of the mid circumflex coronary artery with retrograde filling of the distal obtuse marginal branch the LAD collaterals and total proximal occlusion of his RCA with extensive thrombus burden with faint collateralization to the PDA and PLA vessels from the left coronary circulation.  He underwent difficult but successful PCI to his large RCA requiring PTCA, extensive thrombectomy, Aggrastat with reperfusion induced hypotension and bradycardia necessitating significant fluid resuscitation, atropine and Levophed and ultimately a Resolute Onyx 4.0 x 22 mm stent was inserted and  postdilated to 4.4 mm with 100% occlusion being reduced to 0% and resumption of brisk TIMI-3 flow.  Once the vessel was open there was a very complex 80% trifurcation stenosis the distal RCA at the takeoff of a PDA, large inferior LV branch, and continuation branch with thrombus in the mid PLA with which was collateralized from the left circulation.  He was maintained on Aggrastat and 2 days later on March 27, 2019 he was taken back to the catheterization laboratory and I performed staged complex multi lesion intervention involving the trifurcation stenosis of his distal RCA with Cutting Balloon intervention at all sites and ultimate stenting to the distal RCA into the ostium of the PDA to with a 3.0 x 12 mm Resolute Onyx stent with the 80% stenosis being reduced to 0%, Cutting Balloon to the PDA 1 with the 80% stenosis being reduced to 5%, and Cutting Balloon to the PLA vessel at the ostium being reduced to 5%.  His proximal RCA stent that had been placed 2 days previously had mild narrowing and this was further postdilated to 4.56 mm.  He also underwent successful PCI/DES stenting of the diagonal vessel with insertion of a 2.5 x 15 mm Resolute stent postdilated to 2.75 mm.  He was restarted on Xarelto following his intervention the following day with plans for 1 month of aspirin/Plavix and Xarelto with discontinuance of aspirin after 1 month.  Subsequent to his intervention he has been seen in the office by Randy Cannon April 30, 2019.  At that time, he was advised to discontinue aspirin after 1 month.  He was started on  low-dose lisinopril 2.5 mg.  Patient participated in cardiac rehab following his MI.  He has also been evaluated by his hematologist Dr. Marin Cannon.  Who felt that once he is off Plavix he will then resume full dose Xarelto at 20 mg rather than 15 mg.  The patient has been on atorvastatin 80 mg.  Repeat laboratory in August 2020 showed total cholesterol 174, HDL 32, LDL 116 and triglycerides 131.  The  paperwork was initiated for institution of PCSK9 inhibition but this has not yet been completed.    I saw him for follow-up evaluation in January 2021.  Since his initial office visit with me on July 20, 2019, he was approved with Repatha and he has continued to be on atorvastatin 80 mg, Zetia 10 mg in addition to [place].  He is on levothyroxine 2.5 mg following his MI.  He continues to be on Plavix and Xarelto 15 mg dosing.  He has hypothyroidism on levothyroxine.  Over the past several months, he felt well.  He moved to [place].  He was completely asymptomatic without chest pain PND orthopnea.  There is no bleeding.  During that evaluation we discussed continuation of Plavix and his reduced Xarelto dose for 12 months.  Since June 2021, Plavix was discontinued with resumption of aspirin 81 mg and Xarelto was further increased back to 20 mg daily.  He has seen Dr. Marin Cannon.  Randy Cannon continues to be active.  He admits to some weight gain.  He denies chest pain or shortness of breath.  He is unaware of palpitations.  He continues to be on atorvastatin 80 mg in addition to [place] for aggressive lipid-lowering therapy.  He has not had follow-up laboratory.  He is on levothyroxine 112 mcg for hypothyroidism.  He denies bleeding on baby aspirin and Xarelto 20 mg.  He presents for reevaluation.   Past Medical History:  Diagnosis Date  . Coronary artery disease   . DVT (deep venous thrombosis) ([place])   . DVT of proximal leg (deep vein thrombosis) ([place]) 09/07/2011  . Factor V Leiden mutation ([place]) 09/07/2011  . Hyperlipidemia   . Thyroid disease     Past Surgical History:  Procedure Laterality Date  . CARDIAC CATHETERIZATION    . CORONARY STENT INTERVENTION N/A 03/27/2019   Procedure: CORONARY STENT INTERVENTION;  Surgeon: Randy Sine, MD;  Location: [facility];  Service: Cardiovascular;  Laterality: N/A;  . CORONARY/GRAFT ACUTE MI REVASCULARIZATION N/A 03/25/2019   Procedure:  Coronary/Graft Acute MI Revascularization;  Surgeon: Randy Sine, MD;  Location: [facility];  Service: Cardiovascular;  Laterality: N/A;  . LEFT HEART CATH AND CORONARY ANGIOGRAPHY N/A 03/25/2019   Procedure: LEFT HEART CATH AND CORONARY ANGIOGRAPHY;  Surgeon: Randy Sine, MD;  Location: [facility];  Service: Cardiovascular;  Laterality: N/A;    Current Medications: Outpatient Medications Prior to Visit  Medication Sig Dispense Refill  . acetaminophen (TYLENOL) 325 MG tablet Take 325-650 mg by mouth every 8 (eight) hours as needed for mild pain, moderate pain or headache.     Marland Kitchen aspirin EC 81 MG tablet Take 81 mg by mouth daily. Swallow whole.    Marland Kitchen atorvastatin (LIPITOR) 80 MG tablet Take 1 tablet (80 mg total) by mouth daily at 6 PM. 90 tablet 3  . cetirizine (ZYRTEC) 10 MG tablet Take 10 mg by mouth daily as needed (for seasonal allergies).     . Evolocumab (REPATHA SURECLICK) 616 MG/ML SOAJ Inject 140 mg  into the skin every 14 (fourteen) days. 2 pen 12  . levothyroxine (SYNTHROID) 112 MCG tablet Take 112 mcg by mouth every morning.    Marland Kitchen losartan (COZAAR) 25 MG tablet Take 1 tablet (25 mg total) by mouth daily. 90 tablet 3  . Rivaroxaban (XARELTO) 20 MG TABS tablet Take 1 tablet (20 mg total) by mouth daily with supper. 90 tablet 3  . nitroGLYCERIN (NITROSTAT) 0.4 MG SL tablet Place 1 tablet (0.4 mg total) under the tongue every 5 (five) minutes as needed for chest pain. 25 tablet 3   No facility-administered medications prior to visit.     Allergies:   Patient has no known allergies.   Social History   Socioeconomic History  . Marital status: Married    Spouse name: Not on file  . Number of children: 1  . Years of education: 75  . Highest education level: Not on file  Occupational History  . Not on file  Tobacco Use  . Smoking status: Former Smoker    Quit date: 2014    Years since quitting: 7.6  . Smokeless tobacco: Current User  Substance and Sexual  Activity  . Alcohol use: Yes  . Drug use: Not Currently  . Sexual activity: Not on file  Other Topics Concern  . Not on file  Social History Narrative  . Not on file   Social Determinants of Health   Financial Resource Strain:   . Difficulty of Paying Living Expenses: Not on file  Food Insecurity:   . Worried About Charity fundraiser in the Last Year: Not on file  . Ran Out of Food in the Last Year: Not on file  Transportation Needs:   . Lack of Transportation (Medical): Not on file  . Lack of Transportation (Non-Medical): Not on file  Physical Activity:   . Days of Exercise per Week: Not on file  . Minutes of Exercise per Session: Not on file  Stress:   . Feeling of Stress : Not on file  Social Connections:   . Frequency of Communication with Friends and Family: Not on file  . Frequency of Social Gatherings with Friends and Family: Not on file  . Attends Religious Services: Not on file  . Active Member of Clubs or Organizations: Not on file  . Attends Archivist Meetings: Not on file  . Marital Status: Not on file     Family History:  The patient's family history includes Factor V Leiden deficiency in his father; Heart attack (age of onset: 27) in his paternal grandfather; Hyperlipidemia in his father.   ROS General: Negative; No fevers, chills, or night sweats;  HEENT: Negative; No changes in vision or hearing, sinus congestion, difficulty swallowing Pulmonary: Negative; No cough, wheezing, shortness of breath, hemoptysis Cardiovascular: Negative; No chest pain, presyncope, syncope, palpitations GI: Negative; No nausea, vomiting, diarrhea, or abdominal pain GU: Negative; No dysuria, hematuria, or difficulty voiding Musculoskeletal: Negative; no myalgias, joint pain, or weakness Hematologic/Oncology: Heterozygous for factor V deficiency Endocrine: Negative; no heat/cold intolerance; no diabetes Neuro: Negative; no changes in balance, headaches Skin: Negative;  No rashes or skin lesions Psychiatric: Negative; No behavioral problems, depression Sleep: Negative; No snoring, daytime sleepiness, hypersomnolence, bruxism, restless legs, hypnogognic hallucinations, no cataplexy Other comprehensive 14 point system review is negative.   PHYSICAL EXAM:   VS:  BP 122/82   Pulse (!) 49   Temp (!) 97.1 F (36.2 C)   Ht _0  (1.803 m)   Wt  244 lb (110.7 kg)   SpO2 99%   BMI 34.03 kg/m     Repeat blood pressure by me was 130/80  Wt Readings from Last 3 Encounters:  05/26/20 244 lb (110.7 kg)  03/03/20 236 lb (107 kg)  10/20/19 233 lb (105.7 kg)    General: Alert, oriented, no distress.  Mesomorphic body habitus. Skin: normal turgor, no rashes, warm and dry HEENT: Normocephalic, atraumatic. Pupils equal round and reactive to light; sclera anicteric; extraocular muscles intact;  Nose without nasal septal hypertrophy Mouth/Parynx benign; Mallinpatti scale 3 Neck: No JVD, no carotid bruits; normal carotid upstroke Lungs: clear to ausculatation and percussion; no wheezing or rales Chest wall: without tenderness to palpitation Heart: PMI not displaced, RRR, s1 s2 normal, 1/6 systolic murmur, no diastolic murmur, no rubs, gallops, thrills, or heaves Abdomen: soft, nontender; no hepatosplenomehaly, BS+; abdominal aorta nontender and not dilated by palpation. Back: no CVA tenderness Pulses 2+ Musculoskeletal: full range of motion, normal strength, no joint deformities Extremities: no clubbing cyanosis or edema, Homan's sign negative  Neurologic: grossly nonfocal; Cranial nerves grossly wnl Psychologic: Normal mood and affect    Studies/Labs Reviewed:   EKG:  EKG is ordered today. ECG (independently read by me): Sinus bradycardia at 49 with sinus arrythmia. Inferior Q waves  October 20, 2019 ECG (independently read by me): Normal sinus rhythm at 64 bpm.  Q waves in lead III and aVF.    July 20, 2019 ECG (independently read by me): Sinus  bradycardia at 50 bpm with mild sinus arrhythmia.  Small Q-wave in lead III with preserved R waves in lead II small R wave in lead aVF.  Normal intervals.  Recent Labs: BMP Latest Ref Rng & Units 05/26/2020 03/03/2020 10/16/2019  Glucose 65 - 99 mg/dL 86 98 86  BUN 6 - 24 mg/dL _0 Creatinine 0.76 - 1.27 mg/dL 0.98 0.94 1.01  BUN/Creat Ratio 9 - 20 12 - 14  Sodium 134 - 144 mmol/L 142 139 144  Potassium 3.5 - 5.2 mmol/L 4.8 4.3 4.1  Chloride 96 - 106 mmol/L 105 104 106  CO2 20 - 29 mmol/L 23 29 18(L)  Calcium 8.7 - 10.2 mg/dL 9.9 10.0 9.9     Hepatic Function Latest Ref Rng & Units 05/26/2020 03/03/2020 10/16/2019  Total Protein 6.0 - 8.5 g/dL 7.0 7.3 7.5  Albumin 4.0 - 5.0 g/dL 5.0 4.7 5.0  AST 0 - 40 IU/L _1 ALT 0 - 44 IU/L 65(H) 56(H) 47(H)  Alk Phosphatase 48 - 121 IU/L 73 70 84  Total Bilirubin 0.0 - 1.2 mg/dL 0.6 0.7 0.7  Bilirubin, Direct 0.00 - 0.40 mg/dL - - -    CBC Latest Ref Rng & Units 03/03/2020 10/16/2019 09/03/2019  WBC 4.0 - 10.5 K/uL 6.3 6.9 6.7  Hemoglobin 13.0 - 17.0 g/dL 16.0 17.7 16.1  Hematocrit 39 - 52 % 46.9 52.1(H) 47.7  Platelets 150 - 400 K/uL 263 279 244   Lab Results  Component Value Date   MCV 87.0 03/03/2020   MCV 89 10/16/2019   MCV 87.5 09/03/2019   No results found for: TSH Lab Results  Component Value Date   HGBA1C 5.2 04/21/2011     BNP No results found for: BNP  ProBNP No results found for: PROBNP   Lipid Panel     Component Value Date/Time   CHOL 126 05/26/2020 1125   TRIG 80 05/26/2020 1125   HDL 39 (L) 05/26/2020 1125   CHOLHDL 3.2  05/26/2020 1125   CHOLHDL 4.2 03/25/2019 2217   VLDL 20 03/25/2019 2217   LDLCALC 71 05/26/2020 1125   LABVLDL 16 05/26/2020 1125     RADIOLOGY: No results found.   Additional studies/ records that were reviewed today include:  I reviewed the patient's June 2020 hospitalization with both his cardiac catheterization, initial intervention and subsequent staged PCI procedures.   Reviewed the recent follow-up evaluation hematologically with Dr. Marin Cannon and evaluation from [facility], PA-[facility].   1st RPL lesion is 80% stenosed.  RPDA lesion is 80% stenosed.  Dist RCA lesion is 80% stenosed.  Prox RCA lesion is 100% stenosed.  Ramus lesion is 70% stenosed.  1st Diag lesion is 90% stenosed.  Mid Cx to Dist Cx lesion is 100% stenosed.  There is mild left ventricular systolic dysfunction.  LV end diastolic pressure is normal.  The left ventricular ejection fraction is 50-55% by visual estimate.  3rd RPL-1 lesion is 60% stenosed.  3rd RPL-2 lesion is 100% stenosed.  A drug-eluting stent was successfully placed.  Post intervention, there is a 0% residual stenosis.   Acute inferior ST segment elevation myocardial infarction secondary to total occlusion of a very large dominant RCA proximally with extensive thrombus burden.  Multivessel CAD with percent ostial tapering of a long left main; 90% proximal stenosis in a very proximal diagonal branch of the LAD with a normal LAD extending to the apex; 70% proximal stenosis in the ramus intermediate vessel; total occlusion of the mid left circumflex coronary artery with retrograde filling of the distal obtuse marginal branch from LAD collaterals; and total proximal occlusion of a very large dominant RCA with extensive thrombus burden there is some faint collateralization to the PDA a and PLA vessels from the left coronary circulation.  Difficult but successful PCI to the large RCA requiring PTCA, extensive thrombectomy, Aggrastat with reperfusion induced hypotension and bradycardia necessitating significant fluid resuscitation, atropine and Levophed with ultimate insertion of a 4.0 x 22 mm Resolute Onyx DES stent postdilated to 4.4 mm with the 100% occlusion being reduced to 0% and resumption of brisk TIMI-3 flow.  There is a very complex 80% trifurcation stenosis in the distal RCA at the takeoff of the PDA large inferior LV  branch and continuation branch with thrombus in the mid PLA just collateralized from the left circulation.  Mild acute LV dysfunction with inferobasal hypocontractility.  RECOMMENDATION: The patient left the catheterization laboratory on Cangrelor which will be discontinued in the CCU with subsequent administration of Plavix 600 mg daily.  Patient will be maintained on Aggrastat for at least 24 hours and possibly until Friday a.m. prior to subsequent planned intervention to the complex trifurcation distal LAD stenosis and possibly the diagonal and ramus vessels.  Will ask colleagues to review. At present Xarelto will be held but ultimately he will need to be on triple drug therapy with aspirin/Plavix and Xarelto for 1 month and then Plavix/Xarelto.  Plan echo Doppler in a.m.  Titrate to maximum statin dose.  With the patient's history of significant hyperlipidemia at a young age consider possible familial hyperlipidemia and if LDL cannot reach target would initiate PCSK9 inhibition.   Intervention    1st Diag lesion is 90% stenosed.  Mid Cx to Dist Cx lesion is 100% stenosed.  1st RPL lesion is 80% stenosed.  RPDA lesion is 80% stenosed.  Dist RCA lesion is 80% stenosed.  Ramus lesion is 70% stenosed.  Post intervention, there is a 10% residual stenosis.  Previously  placed Prox RCA-2 drug eluting stent is widely patent.  Balloon angioplasty was performed.  Prox RCA-1 lesion is 20% stenosed.  Post intervention, there is a 0% residual stenosis.  Post intervention, there is a 0% residual stenosis.  Post intervention, there is a 0% residual stenosis.  Post intervention, there is a 0% residual stenosis.  3rd RPL-1 lesion is 80% stenosed.  Post intervention, there is a 50% residual stenosis.  3rd RPL-2 lesion is 80% stenosed.  A stent was successfully placed.  A stent was successfully placed.  A stent was successfully placed.   Successful multi lesion intervention  evolving a trifurcation stenosis of the distal RCA, ostial PDA1, ostial PDA2 and ostial PLA vessel with Cutting Balloon intervention at all sites and ultimate stenting of the distal RCA into the ostium of the PDA2 with a 3.0 x 12 mm Resolute Onyx stent with the 80% stenoses being reduced to 0%; Cutting Balloon to the PDA 1 with the 80% stenosis reduced to 5%.  Following cutting balloon to the PLA vessel the ostial lesion was reduced to less than 5% but following stenting into the PDA 2 vessel there was some ostial narrowing of the PLA vessel 50 to 60%.  Repeat noncompliant dilatation in the proximal portion of the previously placed 4.0 x 22 mm Resolute stent which was additionally postdilated today to 4.56 mm with improvement in the mild digital narrowing to 0%.  Successful PCI/DES stenting of the diagonal vessel with insertion of a 2.5 x 15 mm Resolute stent postdilated to 2.75 mm with the 80% stenosis being reduced to 0%.  RECOMMENDATION; The patient will be restarted on Xarelto tomorrow and for the first month continue aspirin/Plavix and Xarelto with discontinuance of aspirin after 1 month.  Adequate therapy for the patient's totally occluded left circumflex vessel with left to left collaterals as well as the ramus intermediate vessel.  65 to 70% stenosed.  Aggressive lipid-lowering therapy with target LDL less than 70 and if he cannot achieve this would recommend PCSK9 inhibition for aggressive treatment.   Intervention      ASSESSMENT:    1. STEMI involving right coronary artery ([place]): 03/25/2019 treated with DES stenting to the proximal RCA with staged multi lesion PCI to the distal RCA   2. Hyperlipidemia LDL goal <70   3. Factor V Leiden mutation ([place])   4. Hypothyroidism, unspecified type   5. Sinus bradycardia   6. Mild obesity     PLAN:  [name] is an active 44 year old gentleman who has a history of heterozygous factor V deficiency and subsequently has been on  lifelong anticoagulation following significant prior episodes of DVTs in the past.  Despite being on anticoagulation, he developed severe new onset chest pain while biking on a mountain bike on March 25, 2019 and was found to have acute inferior ST segment elevation myocardial infarction secondary to total occlusion of a very large dominant RCA proximally.  There was extensive thrombus burden.  He had significant concomitant CAD.  I again reviewed his catheterization findings on initial intervention as well as his subsequent staged PCI to his trifurcation stenosis in his distal RCA and stenting of his diagonal vessel 2 days later on March 27, 2019.  He had been on Plavix and reduced dose Xarelto for 1 year and since June 2021 is now back on aspirin 81 mg and Xarelto 20 mg daily.  He is tolerating this well and denies bleeding.  He is not having anginal symptoms.  He is now on Repatha and atorvastatin 80 mg.  I am recommending comprehensive metabolic panel and fasting lipid studies today since he is fasting.  We did have some discussion regarding the Covid vaccination and he has discussed this with Dr. Marin Cannon in light of his factor V Leiden deficiency.  We discussed the importance of weight loss with a BMI of 34.03.  Blood pressure today is stable and he continues to be on low-dose losartan 25 mg.  He is bradycardic and not on any negative chronotropic medication.  As long as he remains stable I will see him in 6 months for reevaluation or sooner as needed.  Medication Adjustments/Labs and Tests Ordered: Current medicines are reviewed at length with the patient today.  Concerns regarding medicines are outlined above.  Medication changes, Labs and Tests ordered today are listed in the Patient Instructions below. Patient Instructions  Medication Instructions:  CONTINUE WITH CURRENT MEDICATIONS. NO CHANGES.  *If you need a refill on your cardiac medications before your next appointment, please call your  pharmacy*   Lab Work: TODAY: CMET LIPID  If you have labs (blood work) drawn today and your tests are completely normal, you will receive your results only by: Marland Kitchen MyChart Message (if you have MyChart) OR . A paper copy in the mail If you have any lab test that is abnormal or we need to change your treatment, we will call you to review the results.    Follow-Up: At [facility], you and your health needs are our priority.  As part of our continuing mission to provide you with exceptional heart care, we have created designated Provider Care Teams.  These Care Teams include your primary Cardiologist (physician) and Advanced Practice Providers (APPs -  Physician Assistants and Nurse Practitioners) who all work together to provide you with the care you need, when you need it.  We recommend signing up for the patient portal called "MyChart".  Sign up information is provided on this After Visit Summary.  MyChart is used to connect with patients for Virtual Visits (Telemedicine).  Patients are able to view lab/test results, encounter notes, upcoming appointments, etc.  Non-urgent messages can be sent to your provider as well.   To learn more about what you can do with MyChart, go to [email or web address].    Your next appointment:   6 month(s)  The format for your next appointment:   In Person  Provider:   Shelva Majestic, MD        Signed, Randy Majestic, MD  05/28/2020 3:24 PM    [facility] [address] [place], [place], [place]  [address] Phone: [phone]

## 2020-05-26 NOTE — Patient Instructions (Signed)
Medication Instructions:  CONTINUE WITH CURRENT MEDICATIONS. NO CHANGES.  *If you need a refill on your cardiac medications before your next appointment, please call your pharmacy*   Lab Work: TODAY: CMET LIPID  If you have labs (blood work) drawn today and your tests are completely normal, you will receive your results only by: Marland Kitchen MyChart Message (if you have MyChart) OR . A paper copy in the mail If you have any lab test that is abnormal or we need to change your treatment, we will call you to review the results.    Follow-Up: At [facility], you and your health needs are our priority.  As part of our continuing mission to provide you with exceptional heart care, we have created designated Provider Care Teams.  These Care Teams include your primary Cardiologist (physician) and Advanced Practice Providers (APPs -  Physician Assistants and Nurse Practitioners) who all work together to provide you with the care you need, when you need it.  We recommend signing up for the patient portal called "MyChart".  Sign up information is provided on this After Visit Summary.  MyChart is used to connect with patients for Virtual Visits (Telemedicine).  Patients are able to view lab/test results, encounter notes, upcoming appointments, etc.  Non-urgent messages can be sent to your provider as well.   To learn more about what you can do with MyChart, go to [email or web address].    Your next appointment:   6 month(s)  The format for your next appointment:   In Person  Provider:   Nicki Guadalajara, MD

## 2020-05-28 ENCOUNTER — Encounter: Payer: Self-pay | Admitting: Cardiovascular Disease

## 2020-07-17 ENCOUNTER — Other Ambulatory Visit: Payer: Self-pay | Admitting: Cardiovascular Disease

## 2020-08-30 ENCOUNTER — Telehealth: Payer: Self-pay

## 2020-08-30 NOTE — Telephone Encounter (Signed)
Pt called in to r/s his appts from 09/02/20 as his wife had a covid +test on 08/23/20, he had a - at home test on 11/27.    AOM

## 2020-09-01 DIAGNOSIS — Z20822 Contact with and (suspected) exposure to covid-19: Secondary | ICD-10-CM | POA: Diagnosis not present

## 2020-09-01 DIAGNOSIS — U071 COVID-19: Secondary | ICD-10-CM | POA: Diagnosis not present

## 2020-09-02 ENCOUNTER — Ambulatory Visit: Payer: BC Managed Care – PPO | Admitting: Hematology & Oncology

## 2020-09-02 ENCOUNTER — Telehealth (HOSPITAL_COMMUNITY): Payer: Self-pay

## 2020-09-02 ENCOUNTER — Other Ambulatory Visit (HOSPITAL_COMMUNITY): Payer: Self-pay | Admitting: Family

## 2020-09-02 ENCOUNTER — Other Ambulatory Visit: Payer: BC Managed Care – PPO

## 2020-09-02 DIAGNOSIS — U071 COVID-19: Secondary | ICD-10-CM

## 2020-09-02 NOTE — Telephone Encounter (Signed)
Called patient to pre-screen for monoclonal antibody infusion after receiving recent positive test. Patient qualifies based off off co-morbid condition and/or member of an at risk group.   Patient Active Problem List   Diagnosis Date Noted  . Hyperlipidemia LDL goal <70 06/29/2019  . Coronary artery disease involving native coronary artery of native heart with unstable angina pectoris (HCC)   . STEMI involving right coronary artery (HCC) 03/26/2019  . DVT of proximal leg (deep vein thrombosis) (HCC) 09/07/2011  . Factor V Leiden mutation (HCC) 09/07/2011    Patient is interested in learning more about the infusion. RN forwarded information to APP's for additional screening/scheduling. Pt reported symptom onset of 11/27. Informed him of increased demand with limited supply and that we would get him scheduled asap.   Sandford Diop Loyola Mast, RN

## 2020-09-02 NOTE — Progress Notes (Signed)
I connected by phone with Randy Cannon on 09/02/2020 at 7:11 PM to discuss the potential use of a new treatment for mild to moderate COVID-19 viral infection in non-hospitalized patients.  This patient is a 44 y.o. male that meets the FDA criteria for Emergency Use Authorization of COVID monoclonal antibody casirivimab/imdevimab, bamlanivimab/eteseviamb, or sotrovimab.  Has a (+) direct SARS-CoV-2 viral test result  Has mild or moderate COVID-19   Is NOT hospitalized due to COVID-19  Is within 10 days of symptom onset  Has at least one of the high risk factor(s) for progression to severe COVID-19 and/or hospitalization as defined in EUA.  Specific high risk criteria : Cardiovascular disease or hypertension   Symptoms fatigue, congestion, wet cough began 08/27/20.   I have spoken and communicated the following to the patient or parent/caregiver regarding COVID monoclonal antibody treatment:  1. FDA has authorized the emergency use for the treatment of mild to moderate COVID-19 in adults and pediatric patients with positive results of direct SARS-CoV-2 viral testing who are 46 years of age and older weighing at least 40 kg, and who are at high risk for progressing to severe COVID-19 and/or hospitalization.  2. The significant known and potential risks and benefits of COVID monoclonal antibody, and the extent to which such potential risks and benefits are unknown.  3. Information on available alternative treatments and the risks and benefits of those alternatives, including clinical trials.  4. Patients treated with COVID monoclonal antibody should continue to self-isolate and use infection control measures (e.g., wear mask, isolate, social distance, avoid sharing personal items, clean and disinfect high touch surfaces, and frequent handwashing) according to CDC guidelines.   5. The patient or parent/caregiver has the option to accept or refuse COVID monoclonal antibody  treatment.  After reviewing this information with the patient, the patient has agreed to receive one of the available covid 19 monoclonal antibodies and will be provided an appropriate fact sheet prior to infusion. Morton Stall, NP 09/02/2020 7:11 PM

## 2020-09-05 ENCOUNTER — Ambulatory Visit (HOSPITAL_COMMUNITY)
Admission: RE | Admit: 2020-09-05 | Discharge: 2020-09-05 | Disposition: A | Discharge status: Home / Self Care | Payer: BC Managed Care – PPO | Source: Ambulatory Visit | Attending: Pulmonary Disease | Admitting: Pulmonary Disease

## 2020-09-05 DIAGNOSIS — U071 COVID-19: Secondary | ICD-10-CM | POA: Insufficient documentation

## 2020-09-05 MED ORDER — METHYLPREDNISOLONE SODIUM SUCC 125 MG IJ SOLR: 125.0000 mg | Freq: Once | INTRAMUSCULAR | Status: DC | PRN

## 2020-09-05 MED ORDER — ALBUTEROL SULFATE HFA 108 (90 BASE) MCG/ACT IN AERS: 2.0000 | INHALATION_SPRAY | Freq: Once | RESPIRATORY_TRACT | Status: DC | PRN

## 2020-09-05 MED ORDER — SODIUM CHLORIDE 0.9 % IV SOLN: Freq: Once | INTRAVENOUS | Status: AC

## 2020-09-05 MED ORDER — SODIUM CHLORIDE 0.9 % IV SOLN: INTRAVENOUS | Status: DC | PRN

## 2020-09-05 MED ORDER — FAMOTIDINE IN NACL 20-0.9 MG/50ML-% IV SOLN: 20.0000 mg | Freq: Once | INTRAVENOUS | Status: DC | PRN

## 2020-09-05 MED ORDER — EPINEPHRINE 0.3 MG/0.3ML IJ SOAJ: 0.3000 mg | Freq: Once | INTRAMUSCULAR | Status: DC | PRN

## 2020-09-05 MED ORDER — DIPHENHYDRAMINE HCL 50 MG/ML IJ SOLN: 50.0000 mg | Freq: Once | INTRAMUSCULAR | Status: DC | PRN

## 2020-09-05 NOTE — Discharge Instructions (Signed)
10 Things You Can Do to Manage Your COVID-19 Symptoms at Home If you have possible or confirmed COVID-19: 1. Stay home from work and school. And stay away from other public places. If you must go out, avoid using any kind of public transportation, ridesharing, or taxis. 2. Monitor your symptoms carefully. If your symptoms get worse, call your healthcare provider immediately. 3. Get rest and stay hydrated. 4. If you have a medical appointment, call the healthcare provider ahead of time and tell them that you have or may have COVID-19. 5. For medical emergencies, call 911 and notify the dispatch personnel that you have or may have COVID-19. 6. Cover your cough and sneezes with a tissue or use the inside of your elbow. 7. Wash your hands often with soap and water for at least 20 seconds or clean your hands with an alcohol-based hand sanitizer that contains at least 60% alcohol. 8. As much as possible, stay in a specific room and away from other people in your home. Also, you should use a separate bathroom, if available. If you need to be around other people in or outside of the home, wear a mask. 9. Avoid sharing personal items with other people in your household, like dishes, towels, and bedding. 10. Clean all surfaces that are touched often, like counters, tabletops, and doorknobs. Use household cleaning sprays or wipes according to the label instructions. cdc.gov/coronavirus 04/01/2019 This information is not intended to replace advice given to you by your health care provider. Make sure you discuss any questions you have with your health care provider. Document Revised: 09/03/2019 Document Reviewed: 09/03/2019 Elsevier Patient Education  2020 Elsevier Inc. What types of side effects do monoclonal antibody drugs cause?  Common side effects  In general, the more common side effects caused by monoclonal antibody drugs include: . Allergic reactions, such as hives or itching . Flu-like signs and  symptoms, including chills, fatigue, fever, and muscle aches and pains . Nausea, vomiting . Diarrhea . Skin rashes . Low blood pressure   The CDC is recommending patients who receive monoclonal antibody treatments wait at least 90 days before being vaccinated.  Currently, there are no data on the safety and efficacy of mRNA COVID-19 vaccines in persons who received monoclonal antibodies or convalescent plasma as part of COVID-19 treatment. Based on the estimated half-life of such therapies as well as evidence suggesting that reinfection is uncommon in the 90 days after initial infection, vaccination should be deferred for at least 90 days, as a precautionary measure until additional information becomes available, to avoid interference of the antibody treatment with vaccine-induced immune responses. If you have any questions or concerns after the infusion please call the Advanced Practice Provider on call at [phone]. This number is ONLY intended for your use regarding questions or concerns about the infusion post-treatment side-effects.  Please do not provide this number to others for use. For return to work notes please contact your primary care provider.   If someone you know is interested in receiving treatment please have them call the COVID hotline at [phone].   

## 2020-09-05 NOTE — Progress Notes (Signed)
Patient reviewed Fact Sheet for Patients, Parents, and Caregivers for Emergency Use Authorization (EUA) of REGEN-COV2 for the Treatment of Coronavirus. Patient also reviewed and is agreeable to the estimated cost of treatment. Patient is agreeable to proceed.    

## 2020-09-05 NOTE — Progress Notes (Signed)
  Diagnosis: COVID-19  Physician: Dr. Wright  Procedure: Covid Infusion Clinic Med: casirivimab\imdevimab infusion - Provided patient with casirivimab\imdevimab fact sheet for patients, parents and caregivers prior to infusion.  Complications: No immediate complications noted.  Discharge: Discharged home   Randy Cannon 09/05/2020   

## 2020-09-06 ENCOUNTER — Other Ambulatory Visit: Payer: BC Managed Care – PPO

## 2020-09-06 ENCOUNTER — Ambulatory Visit: Payer: BC Managed Care – PPO | Admitting: Hematology & Oncology

## 2020-09-12 ENCOUNTER — Ambulatory Visit (HOSPITAL_COMMUNITY): Payer: BC Managed Care – PPO

## 2020-09-19 ENCOUNTER — Other Ambulatory Visit: Payer: Self-pay

## 2020-09-19 ENCOUNTER — Encounter: Payer: Self-pay | Admitting: Hematology & Oncology

## 2020-09-19 ENCOUNTER — Inpatient Hospital Stay (HOSPITAL_BASED_OUTPATIENT_CLINIC_OR_DEPARTMENT_OTHER): Payer: BC Managed Care – PPO | Admitting: Hematology & Oncology

## 2020-09-19 ENCOUNTER — Inpatient Hospital Stay: Payer: BC Managed Care – PPO | Attending: Hematology & Oncology

## 2020-09-19 VITALS — BP 126/73 | HR 70 | Temp 98.7°F | Resp 18 | Wt 237.0 lb

## 2020-09-19 DIAGNOSIS — Z8616 Personal history of COVID-19: Secondary | ICD-10-CM | POA: Diagnosis not present

## 2020-09-19 DIAGNOSIS — Z7901 Long term (current) use of anticoagulants: Secondary | ICD-10-CM | POA: Insufficient documentation

## 2020-09-19 DIAGNOSIS — I219 Acute myocardial infarction, unspecified: Secondary | ICD-10-CM | POA: Diagnosis not present

## 2020-09-19 DIAGNOSIS — I2111 ST elevation (STEMI) myocardial infarction involving right coronary artery: Secondary | ICD-10-CM

## 2020-09-19 DIAGNOSIS — I2510 Atherosclerotic heart disease of native coronary artery without angina pectoris: Secondary | ICD-10-CM | POA: Insufficient documentation

## 2020-09-19 DIAGNOSIS — D6851 Activated protein C resistance: Secondary | ICD-10-CM | POA: Diagnosis not present

## 2020-09-19 DIAGNOSIS — I252 Old myocardial infarction: Secondary | ICD-10-CM | POA: Diagnosis not present

## 2020-09-19 DIAGNOSIS — I824Y1 Acute embolism and thrombosis of unspecified deep veins of right proximal lower extremity: Secondary | ICD-10-CM | POA: Diagnosis not present

## 2020-09-19 DIAGNOSIS — Z79899 Other long term (current) drug therapy: Secondary | ICD-10-CM | POA: Insufficient documentation

## 2020-09-19 DIAGNOSIS — R5383 Other fatigue: Secondary | ICD-10-CM | POA: Insufficient documentation

## 2020-09-19 LAB — CBC WITH DIFFERENTIAL (CANCER CENTER ONLY)
Abs Immature Granulocytes: 0.02 10*3/uL (ref 0.00–0.07)
Basophils Absolute: 0 10*3/uL (ref 0.0–0.1)
Basophils Relative: 1 %
Eosinophils Absolute: 0.2 10*3/uL (ref 0.0–0.5)
Eosinophils Relative: 2 %
HCT: 45.6 % (ref 39.0–52.0)
Hemoglobin: 15.9 g/dL (ref 13.0–17.0)
Immature Granulocytes: 0 %
Lymphocytes Relative: 25 %
Lymphs Abs: 1.9 10*3/uL (ref 0.7–4.0)
MCH: 29.9 pg (ref 26.0–34.0)
MCHC: 34.9 g/dL (ref 30.0–36.0)
MCV: 85.7 fL (ref 80.0–100.0)
Monocytes Absolute: 0.7 10*3/uL (ref 0.1–1.0)
Monocytes Relative: 9 %
Neutro Abs: 4.8 10*3/uL (ref 1.7–7.7)
Neutrophils Relative %: 63 %
Platelet Count: 309 10*3/uL (ref 150–400)
RBC: 5.32 MIL/uL (ref 4.22–5.81)
RDW: 12.2 % (ref 11.5–15.5)
WBC Count: 7.6 10*3/uL (ref 4.0–10.5)
nRBC: 0 % (ref 0.0–0.2)

## 2020-09-19 LAB — CMP (CANCER CENTER ONLY)
ALT: 50 U/L — ABNORMAL HIGH (ref 0–44)
AST: 24 U/L (ref 15–41)
Albumin: 4.7 g/dL (ref 3.5–5.0)
Alkaline Phosphatase: 86 U/L (ref 38–126)
Anion gap: 8 (ref 5–15)
BUN: 13 mg/dL (ref 6–20)
CO2: 29 mmol/L (ref 22–32)
Calcium: 10.1 mg/dL (ref 8.9–10.3)
Chloride: 103 mmol/L (ref 98–111)
Creatinine: 0.98 mg/dL (ref 0.61–1.24)
GFR, Estimated: >60 mL/min (ref >60)
Glucose, Bld: 117 mg/dL — ABNORMAL HIGH (ref 70–99)
Potassium: 3.7 mmol/L (ref 3.5–5.1)
Sodium: 140 mmol/L (ref 135–145)
Total Bilirubin: 0.7 mg/dL (ref 0.3–1.2)
Total Protein: 7.5 g/dL (ref 6.5–8.1)

## 2020-09-19 NOTE — Progress Notes (Signed)
The first time I heard  Hematology and Oncology Follow Up Visit  Randy Cannon [id number] [birth year] 44 y.o. 09/19/2020   Principle Diagnosis:   Factor V Leiden mutation-heterozygous  Coronary artery disease-status post myocardial infarctions with stents  Current Therapy:    Xarelto 15 mg p.o. daily     Interim History:  [name] is back for a follow-up.  We see him every 6 months.  So far, he is doing quite well.  He [place] out [place] in the [place].  That a wonderful time.  I saw pictures.  It looks like they really enjoyed themselves.  He is working.  Hopefully he will be able to travel soon.  He enjoys going over to [place].  Maybe, he will be able to take his family.  His heart is doing okay.  He is on double blockade of medications for cholesterol.  Cardiology has been very aggressive with this.  He is now off his Plavix.  He is on Xarelto.  He says he needs to have a tooth pulled.  We will have to see about getting him off Xarelto for a couple days prior to the extraction.  I do not see a problem with him being off Xarelto for couple days.  He has had no problems with bleeding.  He has had no change in bowel or bladder habits.  He does exercise.  He is trying to stay active.  He actually had Covid around his birthday.  He has had mild symptoms.  Overall, his performance status is ECOG 1.    Medications:  Current Outpatient Medications:  .  REPATHA SURECLICK 140 MG/ML SOAJ, ADMINISTER 1 ML UNDER THE SKIN EVERY 14 DAYS, Disp: 2 mL, Rfl: 11 .  acetaminophen (TYLENOL) 325 MG tablet, Take 325-650 mg by mouth every 8 (eight) hours as needed for mild pain, moderate pain or headache. , Disp: , Rfl:  .  aspirin EC 81 MG tablet, Take 81 mg by mouth daily. Swallow whole., Disp: , Rfl:  .  atorvastatin (LIPITOR) 80 MG tablet, Take 1 tablet (80 mg total) by mouth daily at 6 PM., Disp: 90 tablet, Rfl: 3 .  cetirizine (ZYRTEC) 10 MG tablet, Take 10 mg by mouth daily  as needed (for seasonal allergies). , Disp: , Rfl:  .  levothyroxine (SYNTHROID) 112 MCG tablet, Take 112 mcg by mouth every morning., Disp: , Rfl:  .  losartan (COZAAR) 25 MG tablet, Take 1 tablet (25 mg total) by mouth daily., Disp: 90 tablet, Rfl: 3 .  nitroGLYCERIN (NITROSTAT) 0.4 MG SL tablet, Place 1 tablet (0.4 mg total) under the tongue every 5 (five) minutes as needed for chest pain., Disp: 25 tablet, Rfl: 3 .  penicillin v potassium (VEETID) 500 MG tablet, Take 500 mg by mouth every 6 (six) hours., Disp: , Rfl:  .  Rivaroxaban (XARELTO) 20 MG TABS tablet, Take 1 tablet (20 mg total) by mouth daily with supper., Disp: 90 tablet, Rfl: 3  Allergies: No Known Allergies  Past Medical History, Surgical history, Social history, and Family History were reviewed and updated.  Review of Systems: Review of Systems  Constitutional: Positive for fatigue.  HENT:  Negative.   Eyes: Negative.   Respiratory: Negative.   Cardiovascular: Negative.   Gastrointestinal: Negative.   Endocrine: Negative.   Genitourinary: Negative.    Musculoskeletal: Negative.   Skin: Negative.   Neurological: Negative.   Hematological: Negative.   Psychiatric/Behavioral: Negative.     Physical  Exam:  weight is 237 lb (107.5 kg). His oral temperature is 98.7 F (37.1 C). His blood pressure is 126/73 and his pulse is 70. His respiration is 18 and oxygen saturation is 97%.   Wt Readings from Last 3 Encounters:  09/19/20 237 lb (107.5 kg)  05/26/20 244 lb (110.7 kg)  03/03/20 236 lb (107 kg)    Physical Exam Vitals reviewed.  HENT:     Head: Normocephalic and atraumatic.  Eyes:     Pupils: Pupils are equal, round, and reactive to light.  Cardiovascular:     Rate and Rhythm: Normal rate and regular rhythm.     Heart sounds: Normal heart sounds.  Pulmonary:     Effort: Pulmonary effort is normal.     Breath sounds: Normal breath sounds.  Abdominal:     General: Bowel sounds are normal.      Palpations: Abdomen is soft.  Musculoskeletal:        General: No tenderness or deformity. Normal range of motion.     Cervical back: Normal range of motion.  Lymphadenopathy:     Cervical: No cervical adenopathy.  Skin:    General: Skin is warm and dry.     Findings: No erythema or rash.  Neurological:     Mental Status: He is alert and oriented to person, place, and time.  Psychiatric:        Behavior: Behavior normal.        Thought Content: Thought content normal.        Judgment: Judgment normal.      Lab Results  Component Value Date   WBC 7.6 09/19/2020   HGB 15.9 09/19/2020   HCT 45.6 09/19/2020   MCV 85.7 09/19/2020   PLT 309 09/19/2020     Chemistry      Component Value Date/Time   NA 140 09/19/2020 1431   NA 142 05/26/2020 1125   K 3.7 09/19/2020 1431   CL 103 09/19/2020 1431   CO2 29 09/19/2020 1431   BUN 13 09/19/2020 1431   BUN 12 05/26/2020 1125   CREATININE 0.98 09/19/2020 1431      Component Value Date/Time   CALCIUM 10.1 09/19/2020 1431   ALKPHOS 86 09/19/2020 1431   AST 24 09/19/2020 1431   ALT 50 (H) 09/19/2020 1431   BILITOT 0.7 09/19/2020 1431       Impression and Plan: [name] is a 44 year old white male.  He has a history of factor V Leiden mutation.  He subsequently developed a myocardial infarction.  He is on highly aggressive therapy now.  He is on Xarelto.  I think he is going to need lifelong Xarelto.  Again, we will have to see about the tooth extraction.  We will have to get hold of the dentist to see when he would like to do this.  I will go ahead and plan to get him back in 6 more months.  I am glad that the COVID infection was just mild.  Of note, he did have monoclonal antibody therapy for this.   Josph Macho, MD 12/20/20213:16 PM

## 2020-11-09 ENCOUNTER — Telehealth: Payer: Self-pay

## 2020-11-09 NOTE — Telephone Encounter (Signed)
Called pt and r/s his 03/20/21 appt due to md on call,       Randy Cannon

## 2020-11-28 ENCOUNTER — Other Ambulatory Visit: Payer: Self-pay

## 2020-11-28 ENCOUNTER — Encounter: Payer: Self-pay | Admitting: Cardiovascular Disease

## 2020-11-28 ENCOUNTER — Ambulatory Visit (INDEPENDENT_AMBULATORY_CARE_PROVIDER_SITE_OTHER): Payer: BC Managed Care – PPO | Admitting: Cardiovascular Disease

## 2020-11-28 DIAGNOSIS — E669 Obesity, unspecified: Secondary | ICD-10-CM

## 2020-11-28 DIAGNOSIS — D6851 Activated protein C resistance: Secondary | ICD-10-CM

## 2020-11-28 DIAGNOSIS — E039 Hypothyroidism, unspecified: Secondary | ICD-10-CM

## 2020-11-28 DIAGNOSIS — E785 Hyperlipidemia, unspecified: Secondary | ICD-10-CM | POA: Diagnosis not present

## 2020-11-28 DIAGNOSIS — I2111 ST elevation (STEMI) myocardial infarction involving right coronary artery: Secondary | ICD-10-CM | POA: Diagnosis not present

## 2020-11-28 DIAGNOSIS — Z7901 Long term (current) use of anticoagulants: Secondary | ICD-10-CM

## 2020-11-28 NOTE — Patient Instructions (Signed)
Medication Instructions:  The current medical regimen is effective;  continue present plan and medications.  *If you need a refill on your cardiac medications before your next appointment, please call your pharmacy*   Lab Work: TSH, CMET, LIPID (come back fasting, nothing to eat or drink, no lab appointment needed)  If you have labs (blood work) drawn today and your tests are completely normal, you will receive your results only by: Marland Kitchen MyChart Message (if you have MyChart) OR . A paper copy in the mail If you have any lab test that is abnormal or we need to change your treatment, we will call you to review the results.   Follow-Up: At [facility], you and your health needs are our priority.  As part of our continuing mission to provide you with exceptional heart care, we have created designated Provider Care Teams.  These Care Teams include your primary Cardiologist (physician) and Advanced Practice Providers (APPs -  Physician Assistants and Nurse Practitioners) who all work together to provide you with the care you need, when you need it.  We recommend signing up for the patient portal called "MyChart".  Sign up information is provided on this After Visit Summary.  MyChart is used to connect with patients for Virtual Visits (Telemedicine).  Patients are able to view lab/test results, encounter notes, upcoming appointments, etc.  Non-urgent messages can be sent to your provider as well.   To learn more about what you can do with MyChart, go to [email or web address].    Your next appointment:   6 month(s)  The format for your next appointment:   In Person  Provider:   Nicki Guadalajara, MD

## 2020-11-28 NOTE — Progress Notes (Signed)
Cardiology Office Note    Date:  11/28/2020   ID:  Randy, Cannon 1976/07/21, MRN [id number]  PCP:  Kathyrn Lass, MD  Cardiologist:  Shelva Majestic, MD   History of Present Illness:  Randy Cannon is a 45 y.o. male who presents for a 43-month follow-up cardiology evaluation.   Randy Cannon has a history of heterozygous factor V Leiden deficiency, remote DVTs in 2006 in 2012 with IVC filter and who had been on chronic Xarelto anticoagulation.  He has a longstanding history of hyperlipidemia with high cholesterols at a young age suggesting possible familial hyperlipidemia.  He denied any prior history of chest pain but on March 25, 2019 while [place] bike [place] developed severe chest pain, required EMS rescue via [place] and ECG upon arrival to [facility] revealed inferolateral ST elevation with precordial ST depression and a code STEMI was activated.  I performed emergent cardiac catheterization in the setting of his acute inferior ST elevation MI which was secondary to total occlusion of a very large dominant RCA approximately with extensive thrombus burden.  He was also found to have multivessel CAD with mild ostial tapering of a long left main, 90% proximal stenosis in a very proximal diagonal branch of his LAD, 70% proximal stenosis in the ramus intermediate vessel, total occlusion of the mid circumflex coronary artery with retrograde filling of the distal obtuse marginal branch the LAD collaterals and total proximal occlusion of his RCA with extensive thrombus burden with faint collateralization to the PDA and PLA vessels from the left coronary circulation.  He underwent difficult but successful PCI to his large RCA requiring PTCA, extensive thrombectomy, Aggrastat with reperfusion induced hypotension and bradycardia necessitating significant fluid resuscitation, atropine and Levophed and ultimately a Resolute Onyx 4.0 x 22 mm stent was inserted and  postdilated to 4.4 mm with 100% occlusion being reduced to 0% and resumption of brisk TIMI-3 flow.  Once the vessel was open there was a very complex 80% trifurcation stenosis the distal RCA at the takeoff of a PDA, large inferior LV branch, and continuation branch with thrombus in the mid PLA with which was collateralized from the left circulation.  He was maintained on Aggrastat and 2 days later on March 27, 2019 he was taken back to the catheterization laboratory and I performed staged complex multi lesion intervention involving the trifurcation stenosis of his distal RCA with Cutting Balloon intervention at all sites and ultimate stenting to the distal RCA into the ostium of the PDA to with a 3.0 x 12 mm Resolute Onyx stent with the 80% stenosis being reduced to 0%, Cutting Balloon to the PDA 1 with the 80% stenosis being reduced to 5%, and Cutting Balloon to the PLA vessel at the ostium being reduced to 5%.  His proximal RCA stent that had been placed 2 days previously had mild narrowing and this was further postdilated to 4.56 mm.  He also underwent successful PCI/DES stenting of the diagonal vessel with insertion of a 2.5 x 15 mm Resolute stent postdilated to 2.75 mm.  He was restarted on Xarelto following his intervention the following day with plans for 1 month of aspirin/Plavix and Xarelto with discontinuance of aspirin after 1 month.  Subsequent to his intervention he has been seen in the office by Almyra Deforest on April 30, 2019.  At that time, he was advised to discontinue aspirin after 1 month.  He was started on  low-dose lisinopril 2.5 mg.  Patient participated in cardiac rehab following his MI.  He has also been evaluated by his hematologist Dr. Marin Olp.  Who felt that once he is off Plavix he will then resume full dose Xarelto at 20 mg rather than 15 mg.  The patient has been on atorvastatin 80 mg.  Repeat laboratory in August 2020 showed total cholesterol 174, HDL 32, LDL 116 and triglycerides 131.  The  paperwork was initiated for institution of PCSK9 inhibition but this has not yet been completed.    I saw him for follow-up evaluation in January 2021.  Since his initial office visit with me on July 20, 2019, he was approved with Repatha and he has continued to be on atorvastatin 80 mg, Zetia 10 mg in addition to [place].  He is on levothyroxine 2.5 mg following his MI.  He continues to be on Plavix and Xarelto 15 mg dosing.  He has hypothyroidism on levothyroxine.  Over the past several months, he felt well.  He moved to [place].  He was completely asymptomatic without chest pain PND orthopnea.  There is no bleeding.  During that evaluation we discussed continuation of Plavix and his reduced Xarelto dose for 12 months.  Since June 2021, Plavix was discontinued with resumption of aspirin 81 mg and Xarelto was further increased back to 20 mg daily.  He has seen Dr. Marin Olp.  I last saw him in August 2021 at which time he remained stable.  He denied any chest pain or shortness of breath and was unaware of palpitations.  He continues to be on atorvastatin 80 mg in addition to [place] for aggressive lipid-lowering therapy.  He had not had any recent laboratory.  He was on thyroid Roxanne 112 mcg for hypothyroidism.  Blood pressure was stable on low-dose losartan 25 mg.  He was bradycardic and not on any negative chronotropic medication.  Over the past 6 months, he has continued to remain stable.  Laboratory in August 2021 showed an LDL cholesterol at 71.  Creatinine was 0.98 in December 2021.  He has recently seen Dr. Marin Olp and has remained stable on Xarelto 20 mg in addition to baby aspirin.  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  To typical weight gain during the winter months and will be trying to lose some weight which he had gained.  He believes he is sleeping well but he has been told that he snores.  He denies daytime sleepiness or  nonrestorative sleep.  He presents for reevaluation.   Past Medical History:  Diagnosis Date  . Coronary artery disease   . DVT (deep venous thrombosis) ([employer])   . DVT of proximal leg (deep vein thrombosis) ([place]) 09/07/2011  . Factor V Leiden mutation ([place]) 09/07/2011  . Hyperlipidemia   . Thyroid disease     Past Surgical History:  Procedure Laterality Date  . CARDIAC CATHETERIZATION    . CORONARY STENT INTERVENTION N/A 03/27/2019   Procedure: CORONARY STENT INTERVENTION;  Surgeon: Troy Sine, MD;  Location: [place] [facility];  Service: Cardiovascular;  Laterality: N/A;  . CORONARY/GRAFT ACUTE MI REVASCULARIZATION N/A 03/25/2019   Procedure: Coronary/Graft Acute MI Revascularization;  Surgeon: Troy Sine, MD;  Location: [place] [facility];  Service: Cardiovascular;  Laterality: N/A;  . LEFT HEART CATH AND CORONARY ANGIOGRAPHY N/A 03/25/2019   Procedure: LEFT HEART CATH AND CORONARY ANGIOGRAPHY;  Surgeon: Troy Sine, MD;  Location: [facility]  INVASIVE CV LAB;  Service: Cardiovascular;  Laterality: N/A;    Current Medications: Outpatient Medications Prior to Visit  Medication Sig Dispense Refill  . acetaminophen (TYLENOL) 325 MG tablet Take 325-650 mg by mouth every 8 (eight) hours as needed for mild pain, moderate pain or headache.     Marland Kitchen aspirin EC 81 MG tablet Take 81 mg by mouth daily. Swallow whole.    Marland Kitchen atorvastatin (LIPITOR) 80 MG tablet Take 1 tablet (80 mg total) by mouth daily at 6 PM. 90 tablet 3  . cetirizine (ZYRTEC) 10 MG tablet Take 10 mg by mouth daily as needed (for seasonal allergies).     Marland Kitchen levothyroxine (SYNTHROID) 112 MCG tablet Take 112 mcg by mouth every morning.    Marland Kitchen losartan (COZAAR) 25 MG tablet Take 1 tablet (25 mg total) by mouth daily. 90 tablet 3  . nitroGLYCERIN (NITROSTAT) 0.4 MG SL tablet Place 1 tablet (0.4 mg total) under the tongue every 5 (five) minutes as needed for chest pain. 25 tablet 3  . REPATHA SURECLICK 595 MG/ML SOAJ ADMINISTER 1 ML  UNDER THE SKIN EVERY 14 DAYS 2 mL 11  . Rivaroxaban (XARELTO) 20 MG TABS tablet Take 1 tablet (20 mg total) by mouth daily with supper. 90 tablet 3  . penicillin v potassium (VEETID) 500 MG tablet Take 500 mg by mouth every 6 (six) hours.     No facility-administered medications prior to visit.     Allergies:   Patient has no known allergies.   Social History   Socioeconomic History  . Marital status: Married    Spouse name: Not on file  . Number of children: 1  . Years of education: 9  . Highest education level: Not on file  Occupational History  . Not on file  Tobacco Use  . Smoking status: Former Smoker    Quit date: 2014    Years since quitting: 8.1  . Smokeless tobacco: Current User  Substance and Sexual Activity  . Alcohol use: Yes  . Drug use: Not Currently  . Sexual activity: Not on file  Other Topics Concern  . Not on file  Social History Narrative  . Not on file   Social Determinants of Health   Financial Resource Strain: Not on file  Food Insecurity: Not on file  Transportation Needs: Not on file  Physical Activity: Not on file  Stress: Not on file  Social Connections: Not on file     Family History:  The patient's family history includes Factor V Leiden deficiency in his father; Heart attack (age of onset: 70) in his paternal grandfather; Hyperlipidemia in his father.   ROS General: Negative; No fevers, chills, or night sweats;  HEENT: Negative; No changes in vision or hearing, sinus congestion, difficulty swallowing Pulmonary: Negative; No cough, wheezing, shortness of breath, hemoptysis Cardiovascular: Negative; No chest pain, presyncope, syncope, palpitations GI: Negative; No nausea, vomiting, diarrhea, or abdominal pain GU: Negative; No dysuria, hematuria, or difficulty voiding Musculoskeletal: Negative; no myalgias, joint pain, or weakness Hematologic/Oncology: Heterozygous for factor V deficiency Endocrine: Negative; no heat/cold intolerance;  no diabetes Neuro: Negative; no changes in balance, headaches Skin: Negative; No rashes or skin lesions Psychiatric: Negative; No behavioral problems, depression Sleep: Negative; No snoring, daytime sleepiness, hypersomnolence, bruxism, restless legs, hypnogognic hallucinations, no cataplexy Other comprehensive 14 point system review is negative.   PHYSICAL EXAM:   VS:  BP 100/68 (BP Location: Left Arm, Patient Position: Sitting)   Pulse 61   Ht $R'5\' 11"'Ev$  (  1.803 m)   Wt 245 lb 4.8 oz (111.3 kg)   SpO2 94%   BMI 34.21 kg/m     Repeat blood pressure by me was 120/70 supine it was 120/74 standing  Wt Readings from Last 3 Encounters:  11/28/20 245 lb 4.8 oz (111.3 kg)  09/19/20 237 lb (107.5 kg)  05/26/20 244 lb (110.7 kg)    General: Alert, oriented, no distress.  Skin: normal turgor, no rashes, warm and dry HEENT: Normocephalic, atraumatic. Pupils equal round and reactive to light; sclera anicteric; extraocular muscles intact;  Nose without nasal septal hypertrophy Mouth/Parynx benign; Mallinpatti scale 3 Neck: Thick neck; no JVD, no carotid bruits; normal carotid upstroke Lungs: clear to ausculatation and percussion; no wheezing or rales Chest wall: without tenderness to palpitation Heart: PMI not displaced, RRR, s1 s2 normal, 1/6 systolic murmur, no diastolic murmur, no rubs, gallops, thrills, or heaves Abdomen: soft, nontender; no hepatosplenomehaly, BS+; abdominal aorta nontender and not dilated by palpation. Back: no CVA tenderness Pulses 2+ Musculoskeletal: full range of motion, normal strength, no joint deformities Extremities: no clubbing cyanosis or edema, Homan's sign negative  Neurologic: grossly nonfocal; Cranial nerves grossly wnl Psychologic: Normal mood and affect   Studies/Labs Reviewed:   EKG:  EKG is ordered today. ECG (independently read by me): NSR at 61 with sinus arrythmia, Q waves inferiorly  August 26,2021 ECG (independently read by me): Sinus  bradycardia at 49 with sinus arrythmia. Inferior Q waves  October 20, 2019 ECG (independently read by me): Normal sinus rhythm at 64 bpm.  Q waves in lead III and aVF.    July 20, 2019 ECG (independently read by me): Sinus bradycardia at 50 bpm with mild sinus arrhythmia.  Small Q-wave in lead III with preserved R waves in lead II small R wave in lead aVF.  Normal intervals.  Recent Labs: BMP Latest Ref Rng & Units 09/19/2020 05/26/2020 03/03/2020  Glucose 70 - 99 mg/dL 117(H) 86 98  BUN 6 - 20 mg/dL _0 Creatinine 0.61 - 1.24 mg/dL 0.98 0.98 0.94  BUN/Creat Ratio 9 - 20 - 12 -  Sodium 135 - 145 mmol/L 140 142 139  Potassium 3.5 - 5.1 mmol/L 3.7 4.8 4.3  Chloride 98 - 111 mmol/L 103 105 104  CO2 22 - 32 mmol/L _1 Calcium 8.9 - 10.3 mg/dL 10.1 9.9 10.0     Hepatic Function Latest Ref Rng & Units 09/19/2020 05/26/2020 03/03/2020  Total Protein 6.5 - 8.1 g/dL 7.5 7.0 7.3  Albumin 3.5 - 5.0 g/dL 4.7 5.0 4.7  AST 15 - 41 U/L _2 ALT 0 - 44 U/L 50(H) 65(H) 56(H)  Alk Phosphatase 38 - 126 U/L 86 73 70  Total Bilirubin 0.3 - 1.2 mg/dL 0.7 0.6 0.7  Bilirubin, Direct 0.00 - 0.40 mg/dL - - -    CBC Latest Ref Rng & Units 09/19/2020 03/03/2020 10/16/2019  WBC 4.0 - 10.5 K/uL 7.6 6.3 6.9  Hemoglobin 13.0 - 17.0 g/dL 15.9 16.0 17.7  Hematocrit 39.0 - 52.0 % 45.6 46.9 52.1(H)  Platelets 150 - 400 K/uL 309 263 279   Lab Results  Component Value Date   MCV 85.7 09/19/2020   MCV 87.0 03/03/2020   MCV 89 10/16/2019   No results found for: TSH Lab Results  Component Value Date   HGBA1C 5.2 04/21/2011     BNP No results found for: BNP  ProBNP No results found for: PROBNP   Lipid Panel  Component Value Date/Time   CHOL 126 05/26/2020 1125   TRIG 80 05/26/2020 1125   HDL 39 (L) 05/26/2020 1125   CHOLHDL 3.2 05/26/2020 1125   CHOLHDL 4.2 03/25/2019 2217   VLDL 20 03/25/2019 2217   LDLCALC 71 05/26/2020 1125   LABVLDL 16 05/26/2020 1125     RADIOLOGY: No  results found.   Additional studies/ records that were reviewed today include:  I reviewed the patient's June 2020 hospitalization with both his cardiac catheterization, initial intervention and subsequent staged PCI procedures.  Reviewed the recent follow-up evaluation hematologically with Dr. Marin Olp and evaluation from [facility].   1st RPL lesion is 80% stenosed.  RPDA lesion is 80% stenosed.  Dist RCA lesion is 80% stenosed.  Prox RCA lesion is 100% stenosed.  Ramus lesion is 70% stenosed.  1st Diag lesion is 90% stenosed.  Mid Cx to Dist Cx lesion is 100% stenosed.  There is mild left ventricular systolic dysfunction.  LV end diastolic pressure is normal.  The left ventricular ejection fraction is 50-55% by visual estimate.  3rd RPL-1 lesion is 60% stenosed.  3rd RPL-2 lesion is 100% stenosed.  A drug-eluting stent was successfully placed.  Post intervention, there is a 0% residual stenosis.   Acute inferior ST segment elevation myocardial infarction secondary to total occlusion of a very large dominant RCA proximally with extensive thrombus burden.  Multivessel CAD with percent ostial tapering of a long left main; 90% proximal stenosis in a very proximal diagonal branch of the LAD with a normal LAD extending to the apex; 70% proximal stenosis in the ramus intermediate vessel; total occlusion of the mid left circumflex coronary artery with retrograde filling of the distal obtuse marginal branch from LAD collaterals; and total proximal occlusion of a very large dominant RCA with extensive thrombus burden there is some faint collateralization to the PDA a and PLA vessels from the left coronary circulation.  Difficult but successful PCI to the large RCA requiring PTCA, extensive thrombectomy, Aggrastat with reperfusion induced hypotension and bradycardia necessitating significant fluid resuscitation, atropine and Levophed with ultimate insertion of a 4.0 x 22 mm  Resolute Onyx DES stent postdilated to 4.4 mm with the 100% occlusion being reduced to 0% and resumption of brisk TIMI-3 flow.  There is a very complex 80% trifurcation stenosis in the distal RCA at the takeoff of the PDA large inferior LV branch and continuation branch with thrombus in the mid PLA just collateralized from the left circulation.  Mild acute LV dysfunction with inferobasal hypocontractility.  RECOMMENDATION: The patient left the catheterization laboratory on Cangrelor which will be discontinued in the CCU with subsequent administration of Plavix 600 mg daily.  Patient will be maintained on Aggrastat for at least 24 hours and possibly until Friday a.m. prior to subsequent planned intervention to the complex trifurcation distal LAD stenosis and possibly the diagonal and ramus vessels.  Will ask colleagues to review. At present Xarelto will be held but ultimately he will need to be on triple drug therapy with aspirin/Plavix and Xarelto for 1 month and then Plavix/Xarelto.  Plan echo Doppler in a.m.  Titrate to maximum statin dose.  With the patient's history of significant hyperlipidemia at a young age consider possible familial hyperlipidemia and if LDL cannot reach target would initiate PCSK9 inhibition.   Intervention    1st Diag lesion is 90% stenosed.  Mid Cx to Dist Cx lesion is 100% stenosed.  1st RPL lesion is 80% stenosed.  RPDA lesion is  80% stenosed.  Dist RCA lesion is 80% stenosed.  Ramus lesion is 70% stenosed.  Post intervention, there is a 10% residual stenosis.  Previously placed Prox RCA-2 drug eluting stent is widely patent.  Balloon angioplasty was performed.  Prox RCA-1 lesion is 20% stenosed.  Post intervention, there is a 0% residual stenosis.  Post intervention, there is a 0% residual stenosis.  Post intervention, there is a 0% residual stenosis.  Post intervention, there is a 0% residual stenosis.  3rd RPL-1 lesion is 80%  stenosed.  Post intervention, there is a 50% residual stenosis.  3rd RPL-2 lesion is 80% stenosed.  A stent was successfully placed.  A stent was successfully placed.  A stent was successfully placed.   Successful multi lesion intervention evolving a trifurcation stenosis of the distal RCA, ostial PDA1, ostial PDA2 and ostial PLA vessel with Cutting Balloon intervention at all sites and ultimate stenting of the distal RCA into the ostium of the PDA2 with a 3.0 x 12 mm Resolute Onyx stent with the 80% stenoses being reduced to 0%; Cutting Balloon to the PDA 1 with the 80% stenosis reduced to 5%.  Following cutting balloon to the PLA vessel the ostial lesion was reduced to less than 5% but following stenting into the PDA 2 vessel there was some ostial narrowing of the PLA vessel 50 to 60%.  Repeat noncompliant dilatation in the proximal portion of the previously placed 4.0 x 22 mm Resolute stent which was additionally postdilated today to 4.56 mm with improvement in the mild digital narrowing to 0%.  Successful PCI/DES stenting of the diagonal vessel with insertion of a 2.5 x 15 mm Resolute stent postdilated to 2.75 mm with the 80% stenosis being reduced to 0%.  RECOMMENDATION; The patient will be restarted on Xarelto tomorrow and for the first month continue aspirin/Plavix and Xarelto with discontinuance of aspirin after 1 month.  Adequate therapy for the patient's totally occluded left circumflex vessel with left to left collaterals as well as the ramus intermediate vessel.  65 to 70% stenosed.  Aggressive lipid-lowering therapy with target LDL less than 70 and if he cannot achieve this would recommend PCSK9 inhibition for aggressive treatment.   Intervention      ASSESSMENT:    1. STEMI involving right coronary artery (HCC): 03/25/2019 treated with DES stenting to the proximal RCA with staged multi lesion PCI to the distal RCA  and diagonal   2. Hyperlipidemia LDL goal <70   3.  Factor V Leiden mutation (HCC)   4. Mild obesity   5. Hypothyroidism, unspecified type   6. Anticoagulated     PLAN:  Randy Cannon is an active 4 -year-old gentleman who has a history of heterozygous factor V deficiency and subsequently has been on lifelong anticoagulation following significant prior episodes of DVTs.   Despite being on anticoagulation, he developed severe new onset chest pain while biking on a mountain bike on March 25, 2019 and was found to have acute inferior ST segment elevation myocardial infarction secondary to total occlusion of a very large dominant RCA proximally.  There was extensive thrombus burden.  He had significant concomitant CAD.  I  reviewed his catheterization findings on initial intervention as well as his subsequent staged PCI to his trifurcation stenosis in his distal RCA and stenting of his diagonal vessel 2 days later on March 27, 2019.  He also had 70% ramus intermediate stenosis and total occlusion of his distal circumflex.  He was on  Plavix and reduced dose of Xarelto for a year 2021 Plavix was discontinued and he is now on aspirin 81 mg in addition to full dose Xarelto at 20 mg daily and continues to see Dr. Marin Olp.  Presently he feels well.  He denies any anginal symptoms.  He denies any exertional shortness of breath.  He admits to some mild weight gain during the winter months.  Pressure today is stable on low-dose losartan at 25 mg.  He is on levothyroxine 112 mcg and TSH was 5.0 in 2021.  He continues to be on Repatha as well as atorvastatin..  Laboratory in December 2021 showed mild ALT increase at 50 with a normal AST.  He is not fasting today.  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  In the fasting state I have recommended we obtain a comprehensive metabolic panel, lipid studies and TSH.  Discussed the importance of weight loss.  He will be monitoring his sleep.  He reports that his wife has noticed that he  snores.  However he believes his sleep is restorative he denies frequent awakenings and denies any excessive daytime sleepiness.  I will see him in 6 months for reevaluation or sooner as needed.   Medication Adjustments/Labs and Tests Ordered: Current medicines are reviewed at length with the patient today.  Concerns regarding medicines are outlined above.  Medication changes, Labs and Tests ordered today are listed in the Patient Instructions below. Patient Instructions  Medication Instructions:  The current medical regimen is effective;  continue present plan and medications.  *If you need a refill on your cardiac medications before your next appointment, please call your pharmacy*   Lab Work: TSH, CMET, LIPID (come back fasting, nothing to eat or drink, no lab appointment needed)  If you have labs (blood work) drawn today and your tests are completely normal, you will receive your results only by: Marland Kitchen MyChart Message (if you have MyChart) OR . A paper copy in the mail If you have any lab test that is abnormal or we need to change your treatment, we will call you to review the results.   Follow-Up: At [facility], you and your health needs are our priority.  As part of our continuing mission to provide you with exceptional heart care, we have created designated Provider Care Teams.  These Care Teams include your primary Cardiologist (physician) and Advanced Practice Providers (APPs -  Physician Assistants and Nurse Practitioners) who all work together to provide you with the care you need, when you need it.  We recommend signing up for the patient portal called "MyChart".  Sign up information is provided on this After Visit Summary.  MyChart is used to connect with patients for Virtual Visits (Telemedicine).  Patients are able to view lab/test results, encounter notes, upcoming appointments, etc.  Non-urgent messages can be sent to your provider as well.   To learn more about what you can  do with MyChart, go to [email or web address].    Your next appointment:   6 month(s)  The format for your next appointment:   In Person  Provider:   Shelva Majestic, MD        Signed, Shelva Majestic, MD  11/28/2020 12:59 PM    [place] [address] [place], [place], [place]  [address] Phone: [phone]

## 2020-11-30 DIAGNOSIS — E785 Hyperlipidemia, unspecified: Secondary | ICD-10-CM | POA: Diagnosis not present

## 2020-11-30 DIAGNOSIS — Z79899 Other long term (current) drug therapy: Secondary | ICD-10-CM | POA: Diagnosis not present

## 2020-11-30 LAB — LIPID PANEL
Chol/HDL Ratio: 3 ratio (ref 0.0–5.0)
Cholesterol, Total: 104 mg/dL (ref 100–199)
HDL: 35 mg/dL — ABNORMAL LOW (ref >39)
LDL Chol Calc (NIH): 43 mg/dL (ref 0–99)
Triglycerides: 151 mg/dL — ABNORMAL HIGH (ref 0–149)
VLDL Cholesterol Cal: 26 mg/dL (ref 5–40)

## 2020-11-30 LAB — COMPREHENSIVE METABOLIC PANEL
ALT: 53 IU/L — ABNORMAL HIGH (ref 0–44)
AST: 25 IU/L (ref 0–40)
Albumin/Globulin Ratio: 1.8 (ref 1.2–2.2)
Albumin: 4.6 g/dL (ref 4.0–5.0)
Alkaline Phosphatase: 67 IU/L (ref 44–121)
BUN/Creatinine Ratio: 15 (ref 9–20)
BUN: 14 mg/dL (ref 6–24)
Bilirubin Total: 0.5 mg/dL (ref 0.0–1.2)
CO2: 22 mmol/L (ref 20–29)
Calcium: 9.4 mg/dL (ref 8.7–10.2)
Chloride: 105 mmol/L (ref 96–106)
Creatinine, Ser: 0.92 mg/dL (ref 0.76–1.27)
Globulin, Total: 2.5 g/dL (ref 1.5–4.5)
Glucose: 91 mg/dL (ref 65–99)
Potassium: 4 mmol/L (ref 3.5–5.2)
Sodium: 142 mmol/L (ref 134–144)
Total Protein: 7.1 g/dL (ref 6.0–8.5)
eGFR: 104 mL/min/{1.73_m2} (ref >59)

## 2020-11-30 LAB — TSH: TSH: 3.43 u[IU]/mL (ref 0.450–4.500)

## 2020-12-02 DIAGNOSIS — Z20822 Contact with and (suspected) exposure to covid-19: Secondary | ICD-10-CM | POA: Diagnosis not present

## 2021-01-20 DIAGNOSIS — Z20822 Contact with and (suspected) exposure to covid-19: Secondary | ICD-10-CM | POA: Diagnosis not present

## 2021-02-12 DIAGNOSIS — Z20822 Contact with and (suspected) exposure to covid-19: Secondary | ICD-10-CM | POA: Diagnosis not present

## 2021-03-10 ENCOUNTER — Ambulatory Visit: Payer: BC Managed Care – PPO | Admitting: Hematology & Oncology

## 2021-03-10 ENCOUNTER — Other Ambulatory Visit: Payer: BC Managed Care – PPO

## 2021-03-20 ENCOUNTER — Ambulatory Visit: Payer: BC Managed Care – PPO | Admitting: Hematology & Oncology

## 2021-03-20 ENCOUNTER — Other Ambulatory Visit: Payer: BC Managed Care – PPO

## 2021-03-29 ENCOUNTER — Other Ambulatory Visit: Payer: Self-pay | Admitting: Cardiovascular Disease

## 2021-03-29 NOTE — Telephone Encounter (Signed)
89m, 111.3kg, scr 0.92 11/30/20, lovw/kelly 11/28/20, ccr 157.9

## 2021-04-07 ENCOUNTER — Telehealth: Payer: Self-pay

## 2021-04-07 NOTE — Telephone Encounter (Signed)
**Note De-Identified Randy Cannon Obfuscation** Xarelto PA started through covermymeds. Key: [id number]

## 2021-04-07 NOTE — Telephone Encounter (Signed)
**Note De-identified Rendi Mapel Obfuscation** -----  **Note De-Identified Hyrum Shaneyfelt Obfuscation** Message from Harvel Ricks, RN sent at 04/06/2021  2:48 PM EDT ----- Regarding: PA Xarelto Received fax-PA needed for Xarelto  Key: [id number]    Thanks!

## 2021-05-31 ENCOUNTER — Telehealth: Payer: Self-pay

## 2021-05-31 NOTE — Telephone Encounter (Signed)
**Note De-Identified Etheridge Geil Obfuscation** Michel Santee ness Key: [id number] - PA Case ID: [id number] - Rx #: [id number] Outcome: Approved on July 8 Coverage Start Date:03/08/2021;Coverage End Date:04/07/2022; Drug: Xarelto 20MG  tablets Form: [employer] Electronic PA Form (2017 NCPDP) Original Claim Info 18  [facility] pharmacy is aware of this approval.

## 2021-05-31 NOTE — Telephone Encounter (Signed)
**Note De-Identified Coby Antrobus Obfuscation** Michel Santee ness Key: [id number] - PA Case ID: [id number] - Rx #: [id number] Outcome: Approved on July 8 Coverage Start Date:03/08/2021;Coverage End Date:04/07/2022; Drug: Xarelto 20MG  tablets Form: [employer] Electronic PA Form (2017 NCPDP) Original Claim Info 73

## 2021-06-28 DIAGNOSIS — E039 Hypothyroidism, unspecified: Secondary | ICD-10-CM | POA: Diagnosis not present

## 2021-08-06 ENCOUNTER — Other Ambulatory Visit: Payer: Self-pay | Admitting: Cardiovascular Disease

## 2021-09-14 ENCOUNTER — Ambulatory Visit: Payer: BC Managed Care – PPO | Admitting: Physician Assistant

## 2021-10-16 ENCOUNTER — Telehealth: Payer: Self-pay | Admitting: Cardiovascular Disease

## 2021-10-16 ENCOUNTER — Encounter: Payer: Self-pay | Admitting: Cardiovascular Disease

## 2021-10-16 DIAGNOSIS — Z7901 Long term (current) use of anticoagulants: Secondary | ICD-10-CM

## 2021-10-16 DIAGNOSIS — Z01818 Encounter for other preprocedural examination: Secondary | ICD-10-CM

## 2021-10-16 MED ORDER — RIVAROXABAN 20 MG PO TABS: ORAL_TABLET | ORAL | 1 refills | Status: DC

## 2021-10-16 MED ORDER — ATORVASTATIN CALCIUM 80 MG PO TABS: ORAL_TABLET | ORAL | 3 refills | Status: DC

## 2021-10-16 MED ORDER — LOSARTAN POTASSIUM 25 MG PO TABS: 25.0000 mg | ORAL_TABLET | Freq: Every day | ORAL | 3 refills | Status: DC

## 2021-10-16 NOTE — Telephone Encounter (Signed)
° °  Pre-operative Risk Assessment    Patient Name: Randy Cannon  DOB: 1975/12/18 MRN: [id number]     Request for Surgical Clearance    Procedure:  Dental Extraction - Amount of Teeth to be Pulled: surgical removal of tooth number 2 with a site preservation graft, please be aware the procedure is a surgical extraction and a will require a soft tissue flap to be elevated and buckle bone removed  Date of Surgery:  Clearance TBD                                 Surgeon:  Dr. Inda Coke  Surgeon's Group or Practice Name:  [facility] Phone number:  [phone] Fax number:  [phone]   Type of Clearance Requested:   - Pharmacy:  Hold Rivaroxaban (Xarelto) hold stop night before and begin the night of surgery   Type of Anesthesia:  Local  and nitrous   Additional requests/questions:    Signed, Selena Zobro   10/16/2021, 2:53 PM

## 2021-10-17 ENCOUNTER — Other Ambulatory Visit: Payer: Self-pay

## 2021-10-17 NOTE — Telephone Encounter (Signed)
I placed a call out to the pt to set up lab appt, though no answer. Will try again later. Pt does also have appt 10/25/21 with Juanda Crumble, NP.  I will update the requesting  office that the pt needs to have lab work before he can be cleared.

## 2021-10-17 NOTE — Telephone Encounter (Signed)
Patient with diagnosis of prior DVT and Factor V Leiden on Xarelto for anticoagulation.    Procedure: surgical removal of tooth Date of procedure: TBD  CrCl 110 (11/2020) Platelet count - none since 2021  Patient does not require pre-op antibiotics for dental procedure.  Will need CBC, BMET for final determination  (Per note from Dr. Marin Olp (12/21) patient okay to hold Xarelto for 1-2 days for dental extractions - dental office is asking for 1 day hold)   .

## 2021-10-17 NOTE — Telephone Encounter (Signed)
I s/w the pt and he is agreeable to come in for lab work. Pt has appt 10/25/21 but said no problem he will come tomorrow and get labs done for pre op clearance. Pt aware not fasting labs, ok to eat. Labs BMET, CBC to be done at [facility] office per pt request. I will place the lab orders. Pt thanked me for the call and the help.

## 2021-10-17 NOTE — Telephone Encounter (Signed)
Preoperative team, patient has not had recent CBC and BMP.  Pharmacy is not able to provide recommendations at this time.  Please order CBC and BMP.  Please contact patient and let them know that they will need updated lab work.  Once results of lab work are reviewed final recommendations will be made.  Thank you.  Thomasene Ripple. Tabytha Gradillas NP-C    10/17/2021, 9:04 AM [facility] [address] Office [phone] Fax [phone]

## 2021-10-17 NOTE — Telephone Encounter (Signed)
BMET, CBC placed and released to be done at [facility] office

## 2021-10-17 NOTE — Telephone Encounter (Signed)
Refill sent yesterday, 10/16/21 by Margaretmary Dys, RPH.

## 2021-10-18 DIAGNOSIS — Z01818 Encounter for other preprocedural examination: Secondary | ICD-10-CM | POA: Diagnosis not present

## 2021-10-18 DIAGNOSIS — Z7901 Long term (current) use of anticoagulants: Secondary | ICD-10-CM | POA: Diagnosis not present

## 2021-10-19 LAB — CBC
Hematocrit: 48.3 % (ref 37.5–51.0)
Hemoglobin: 16.6 g/dL (ref 13.0–17.7)
MCH: 29.9 pg (ref 26.6–33.0)
MCHC: 34.4 g/dL (ref 31.5–35.7)
MCV: 87 fL (ref 79–97)
Platelets: 265 10*3/uL (ref 150–450)
RBC: 5.56 x10E6/uL (ref 4.14–5.80)
RDW: 13 % (ref 11.6–15.4)
WBC: 6.8 10*3/uL (ref 3.4–10.8)

## 2021-10-19 LAB — BASIC METABOLIC PANEL
BUN/Creatinine Ratio: 14 (ref 9–20)
BUN: 14 mg/dL (ref 6–24)
CO2: 21 mmol/L (ref 20–29)
Calcium: 10.3 mg/dL — ABNORMAL HIGH (ref 8.7–10.2)
Chloride: 102 mmol/L (ref 96–106)
Creatinine, Ser: 0.99 mg/dL (ref 0.76–1.27)
Glucose: 70 mg/dL (ref 70–99)
Potassium: 4.2 mmol/L (ref 3.5–5.2)
Sodium: 142 mmol/L (ref 134–144)
eGFR: 95 mL/min/{1.73_m2} (ref >59)

## 2021-10-19 NOTE — Telephone Encounter (Signed)
I am on it already and have sent a message to the lab tech at [facility]. Message asking if she can add on lab, if not then can she call the pt and have him come in for the BMET that has been placed already.

## 2021-10-19 NOTE — Telephone Encounter (Signed)
Waiting on lab, for some reason, I only see CBC resulted but not the BMET, please verifty if BMET was done.

## 2021-10-20 NOTE — Telephone Encounter (Signed)
BMET has resulted, therefore, will send back to preop pool.

## 2021-10-20 NOTE — Telephone Encounter (Signed)
Patient with diagnosis of prior DVT and Factor V Leiden on Xarelto for anticoagulation.     Procedure: surgical removal of tooth Date of procedure: TBD   CrCl 110 (11/2020) Platelet count - none since 2021   Patient does not require pre-op antibiotics for dental procedure.   SCr 117 Plt 265  Ok to hold Xarelto for 1 day prior to procedure.  Please restart Xarelto night of surgery unless advised otherwise by surgeon.   (Per note from Dr. Myna Hidalgo (12/21) patient okay to hold Xarelto for 1-2 days for dental extractions - dental office is asking for 1 day hold)

## 2021-10-20 NOTE — Telephone Encounter (Signed)
° °  Primary Cardiologist: Nicki Guadalajara, MD  Clinical pharmacist have reviewed patient's past medical history and medications.  Randy Cannon has received the following recommendations:   Patient with diagnosis of prior DVT and Factor V Leiden on Xarelto for anticoagulation.     Procedure: surgical removal of tooth Date of procedure: TBD   CrCl 110 (11/2020) Platelet count - none since 2021   Patient does not require pre-op antibiotics for dental procedure.   SCr 117 Plt 265   Ok to hold Xarelto for 1 day prior to procedure.  Please restart Xarelto night of surgery unless advised otherwise by surgeon.  I will route this recommendation to the requesting party via Epic fax function and remove from pre-op pool.  Please call with questions.  Thomasene Ripple. [name] NP-C    10/20/2021, 1:19 PM [facility] [address] Office [phone] Fax [phone]

## 2021-10-24 NOTE — Progress Notes (Signed)
Cardiology Office Note:    Date:  10/25/2021   ID:  Randy Cannon, DOB Aug 07, 1976, MRN [id number]  PCP:  Kathyrn Lass, MD [place] Cardiologist: Shelva Majestic, MD   Reason for visit: 12-month follow-up  History of Present Illness:    Randy Cannon is a 46 y.o. male with a hx of  heterozygous factor V Leiden deficiency, remote DVTs in 2006 in 2012 with IVC filter and who had been on chronic Xarelto anticoagulation, familial hyperlipidemia, inferior STEMI in 2020 status post PCI to the RCA with staged complex multilesion intervention to the distal RCA into the ostium of the PDA, PCI to the diagonal.  He anticipated in cardiac rehab.  He was last seen by Dr. Claiborne Billings in February 2022 and was doing well.  He was continued on Xarelto 20 mg and baby aspirin with his history of factor V Leiden deficiency and multiple complex PCIs.  Today, he feels well.  He denies chest pain, shortness of breath, syncope, orthopnea, PND or significant pedal edema.  He states he has bought gym equipment and in the process of moving that into his garage.  He also has 3 acres on his property which keeps physically active.  They are building a park behind his property.  He admits his activity is down during the winters.  He tries eat healthy but says that is not always easy with children.  He gets infrequent trace edema.  He has had snoring lifelong but no apneic episodes or daytime sleepiness.  He asked about stopping losartan.  He has discussed with Dr. Claiborne Billings previously who stated it would not necessarily be a long-term medication for him.  He wishes to minimize medications if possible.    Past Medical History:  Diagnosis Date   Coronary artery disease    DVT (deep venous thrombosis) (HCC)    DVT of proximal leg (deep vein thrombosis) ([place]) 09/07/2011   Factor V Leiden mutation ([place]) 09/07/2011   Hyperlipidemia    Thyroid disease     Past Surgical History:  Procedure Laterality Date   CARDIAC  CATHETERIZATION     CORONARY STENT INTERVENTION N/A 03/27/2019   Procedure: CORONARY STENT INTERVENTION;  Surgeon: Troy Sine, MD;  Location: [place] [facility];  Service: Cardiovascular;  Laterality: N/A;   CORONARY/GRAFT ACUTE MI REVASCULARIZATION N/A 03/25/2019   Procedure: Coronary/Graft Acute MI Revascularization;  Surgeon: Troy Sine, MD;  Location: [place] [facility];  Service: Cardiovascular;  Laterality: N/A;   LEFT HEART CATH AND CORONARY ANGIOGRAPHY N/A 03/25/2019   Procedure: LEFT HEART CATH AND CORONARY ANGIOGRAPHY;  Surgeon: Troy Sine, MD;  Location: [place];  Service: Cardiovascular;  Laterality: N/A;    Current Medications: Current Meds  Medication Sig   acetaminophen (TYLENOL) 325 MG tablet Take 325-650 mg by mouth every 8 (eight) hours as needed for mild pain, moderate pain or headache.    aspirin EC 81 MG tablet Take 81 mg by mouth daily. Swallow whole.   atorvastatin (LIPITOR) 80 MG tablet TAKE 1 TABLET(80 MG) BY MOUTH DAILY AT 6 PM   cetirizine (ZYRTEC) 10 MG tablet Take 10 mg by mouth daily as needed (for seasonal allergies).    levothyroxine (SYNTHROID) 112 MCG tablet Take 112 mcg by mouth every morning.   nitroGLYCERIN (NITROSTAT) 0.4 MG SL tablet Place 1 tablet (0.4 mg total) under the tongue every 5 (five) minutes as needed for chest pain.   REPATHA SURECLICK [id number] MG/ML SOAJ ADMINISTER 1  ML UNDER THE SKIN EVERY 14 DAYS   rivaroxaban (XARELTO) 20 MG TABS tablet TAKE 1 TABLET(20 MG) BY MOUTH DAILY WITH SUPPER   [DISCONTINUED] losartan (COZAAR) 25 MG tablet Take 1 tablet (25 mg total) by mouth daily.     Allergies:   Patient has no known allergies.   Social History   Socioeconomic History   Marital status: Married    Spouse name: Not on file   Number of children: 1   Years of education: 16   Highest education level: Not on file  Occupational History   Not on file  Tobacco Use   Smoking status: Former    Types: Cigarettes    Quit  date: 2014    Years since quitting: 9.0   Smokeless tobacco: Current  Substance and Sexual Activity   Alcohol use: Yes   Drug use: Not Currently   Sexual activity: Not on file  Other Topics Concern   Not on file  Social History Narrative   Not on file   Social Determinants of Health   Financial Resource Strain: Not on file  Food Insecurity: Not on file  Transportation Needs: Not on file  Physical Activity: Not on file  Stress: Not on file  Social Connections: Not on file     Family History: The patient's family history includes Factor V Leiden deficiency in his father; Heart attack (age of onset: 65) in his paternal grandfather; Hyperlipidemia in his father.  ROS:   Please see the history of present illness.     EKGs/Labs/Other Studies Reviewed:    EKG:  The ekg ordered today demonstrates sinus bradycardia, inferior infarct, heart rate 52, PR interval 180 ms, QRS 114 ms.  Recent Labs: 11/30/2020: ALT 53; TSH 3.430 10/18/2021: BUN 14; Creatinine, Ser 0.99; Hemoglobin 16.6; Platelets 265; Potassium 4.2; Sodium 142   Recent Lipid Panel Lab Results  Component Value Date/Time   CHOL 104 11/30/2020 08:36 AM   TRIG 151 (H) 11/30/2020 08:36 AM   HDL 35 (L) 11/30/2020 08:36 AM   LDLCALC 43 11/30/2020 08:36 AM    Physical Exam:    VS:  BP 126/78    Pulse (!) 52    Ht 5\' 11"  (1.803 m)    Wt 240 lb (108.9 kg)    BMI 33.47 kg/m    No data found.  Wt Readings from Last 3 Encounters:  10/25/21 240 lb (108.9 kg)  11/28/20 245 lb 4.8 oz (111.3 kg)  09/19/20 237 lb (107.5 kg)     GEN:  Well nourished, well developed in no acute distress, muscular  HEENT: Normal NECK: No JVD; No carotid bruits CARDIAC: RRR, no murmurs, rubs, gallops RESPIRATORY:  Clear to auscultation without rales, wheezing or rhonchi  ABDOMEN: Soft, non-tender, non-distended MUSCULOSKELETAL: No edema; No deformity  SKIN: Warm and dry NEUROLOGIC:  Alert and oriented PSYCHIATRIC:  Normal affect      ASSESSMENT AND PLAN   Coronary artery disease with no angina -inferior STEMI - 03/25/2019 treated with DES stenting to the proximal RCA with staged multi lesion PCI to the distal RCA  and diagonal  -Continue lipid therapy and aspirin. -Okay to stop losartan given no heart failure symptoms, no diabetes and no CKD.  Blood pressure is controlled.  Plans to increase physical activity.    Hyperlipidemia with goal LDL <70 -LDL 43 in March 2022.  Continue Crestor and Repatha. -Discussed cholesterol lowering diets - Mediterranean diet, DASH diet, vegetarian diet, low-carbohydrate diet and avoidance of trans fats.  Discussed healthier choice substitutes.  Nuts, high-fiber foods, and fiber supplements may also improve lipids.    Obesity -Discussed how even a 5-10% weight loss can have cardiovascular benefits.   -Recommend moderate intensity activity for 30 minutes 5 days/week and the DASH diet.  Factor V Leiden Mutation -Continue Xarelto 20 mg daily and aspirin 81 mg -this is a long-term plan after discussion with Dr. Claiborne Billings and his hematologist.  Disposition - Follow-up in 6 months with Dr. Claiborne Billings.  Follow-up blood pressure after discontinuing losartan.  Follow-up weight loss efforts and physical activity.    Medication Adjustments/Labs and Tests Ordered: Current medicines are reviewed at length with the patient today.  Concerns regarding medicines are outlined above.  Orders Placed This Encounter  Procedures   EKG 12-Lead   No orders of the defined types were placed in this encounter.   Patient Instructions  Medication Instructions:  Stop Losartan. *If you need a refill on your cardiac medications before your next appointment, please call your pharmacy*   Lab Work: No Labs If you have labs (blood work) drawn today and your tests are completely normal, you will receive your results only by: [place] (if you have MyChart) OR A paper copy in the mail If you have any lab test that  is abnormal or we need to change your treatment, we will call you to review the results.   Testing/Procedures: No Testing   Follow-Up: At [facility], you and your health needs are our priority.  As part of our continuing mission to provide you with exceptional heart care, we have created designated Provider Care Teams.  These Care Teams include your primary Cardiologist (physician) and Advanced Practice Providers (APPs -  Physician Assistants and Nurse Practitioners) who all work together to provide you with the care you need, when you need it.  We recommend signing up for the patient portal called "MyChart".  Sign up information is provided on this After Visit Summary.  MyChart is used to connect with patients for Virtual Visits (Telemedicine).  Patients are able to view lab/test results, encounter notes, upcoming appointments, etc.  Non-urgent messages can be sent to your provider as well.   To learn more about what you can do with MyChart, go to [email or web address].    Your next appointment:   6 month(s)  The format for your next appointment:   In Person  Provider:   Shelva Majestic, MD     Other Instructions Increase physical activity.    Signed, Warren Lacy, PA-C  10/25/2021 9:10 AM    [facility]

## 2021-10-25 ENCOUNTER — Other Ambulatory Visit: Payer: Self-pay

## 2021-10-25 ENCOUNTER — Ambulatory Visit (INDEPENDENT_AMBULATORY_CARE_PROVIDER_SITE_OTHER): Payer: BC Managed Care – PPO | Admitting: Physician Assistant

## 2021-10-25 ENCOUNTER — Encounter: Payer: Self-pay | Admitting: Physician Assistant

## 2021-10-25 VITALS — BP 126/78 | HR 52 | Ht 71.0 in | Wt 240.0 lb

## 2021-10-25 DIAGNOSIS — E669 Obesity, unspecified: Secondary | ICD-10-CM

## 2021-10-25 DIAGNOSIS — I2510 Atherosclerotic heart disease of native coronary artery without angina pectoris: Secondary | ICD-10-CM

## 2021-10-25 DIAGNOSIS — E785 Hyperlipidemia, unspecified: Secondary | ICD-10-CM

## 2021-10-25 DIAGNOSIS — Z7901 Long term (current) use of anticoagulants: Secondary | ICD-10-CM | POA: Diagnosis not present

## 2021-10-25 DIAGNOSIS — D6851 Activated protein C resistance: Secondary | ICD-10-CM | POA: Diagnosis not present

## 2021-10-25 NOTE — Patient Instructions (Signed)
Medication Instructions:  Stop Losartan. *If you need a refill on your cardiac medications before your next appointment, please call your pharmacy*   Lab Work: No Labs If you have labs (blood work) drawn today and your tests are completely normal, you will receive your results only by: MyChart Message (if you have MyChart) OR A paper copy in the mail If you have any lab test that is abnormal or we need to change your treatment, we will call you to review the results.   Testing/Procedures: No Testing   Follow-Up: At [facility], you and your health needs are our priority.  As part of our continuing mission to provide you with exceptional heart care, we have created designated Provider Care Teams.  These Care Teams include your primary Cardiologist (physician) and Advanced Practice Providers (APPs -  Physician Assistants and Nurse Practitioners) who all work together to provide you with the care you need, when you need it.  We recommend signing up for the patient portal called "MyChart".  Sign up information is provided on this After Visit Summary.  MyChart is used to connect with patients for Virtual Visits (Telemedicine).  Patients are able to view lab/test results, encounter notes, upcoming appointments, etc.  Non-urgent messages can be sent to your provider as well.   To learn more about what you can do with MyChart, go to [email or web address].    Your next appointment:   6 month(s)  The format for your next appointment:   In Person  Provider:   Nicki Guadalajara, MD     Other Instructions Increase physical activity.

## 2021-11-15 DIAGNOSIS — E7800 Pure hypercholesterolemia, unspecified: Secondary | ICD-10-CM | POA: Diagnosis not present

## 2021-11-15 DIAGNOSIS — R946 Abnormal results of thyroid function studies: Secondary | ICD-10-CM | POA: Diagnosis not present

## 2021-11-15 DIAGNOSIS — Z125 Encounter for screening for malignant neoplasm of prostate: Secondary | ICD-10-CM | POA: Diagnosis not present

## 2021-11-15 DIAGNOSIS — Z0000 Encounter for general adult medical examination without abnormal findings: Secondary | ICD-10-CM | POA: Diagnosis not present

## 2021-11-20 DIAGNOSIS — Z0000 Encounter for general adult medical examination without abnormal findings: Secondary | ICD-10-CM | POA: Diagnosis not present

## 2021-11-20 DIAGNOSIS — Z23 Encounter for immunization: Secondary | ICD-10-CM | POA: Diagnosis not present

## 2022-02-19 DIAGNOSIS — Z01818 Encounter for other preprocedural examination: Secondary | ICD-10-CM | POA: Diagnosis not present

## 2022-02-21 ENCOUNTER — Telehealth: Payer: Self-pay

## 2022-02-21 NOTE — Telephone Encounter (Signed)
   Pre-operative Risk Assessment    Patient Name: Randy Cannon  DOB: 1976/01/28 MRN: [id number]      Request for Surgical Clearance    Procedure:   Colonoscopy   Date of Surgery:  Clearance 05/25/22                                 Surgeon:   Surgeon's Group or Practice Name:  [facility]  Phone number:  [phone] Fax number:  [phone]   Type of Clearance Requested:   - Medical  - Pharmacy:  Hold Aspirin and Rivaroxaban (Xarelto)     Type of Anesthesia:  Not Indicated   Additional requests/questions:    Deforest Hoyles   02/21/2022, 5:40 PM

## 2022-02-22 NOTE — Telephone Encounter (Signed)
Anticoagulation request has been addressed.  Preoperative team, please contact this patient and set up a phone call appointment for further cardiac evaluation.  Thank you for your help.  Thomasene Ripple. Anieya Helman NP-C    02/22/2022, 2:09 PM [facility] [address] Office [phone] Fax [phone]

## 2022-02-22 NOTE — Telephone Encounter (Signed)
Patient with diagnosis of prior DVTs and Factor V Leiden deficiency on Xarelto for anticoagulation.    Procedure: colonoscopy Date of procedure: 05/25/22  CrCl >141mL/min Platelet count 265K  Per office protocol, patient can hold Xarelto for 1 day prior to procedure. He should resume as soon as safely possible after.

## 2022-02-27 NOTE — Telephone Encounter (Signed)
Left message to call back and schedule a tele pre op appt.  

## 2022-03-09 ENCOUNTER — Telehealth: Payer: Self-pay | Admitting: *Deleted

## 2022-03-09 NOTE — Telephone Encounter (Signed)
I s/w the pt and he has been scheduled for a tele pre op appt 04/04/22 @ 9:20. Med rec and consent are done.      Patient Consent for Virtual Visit        Randy Cannon has provided verbal consent on 03/09/2022 for a virtual visit (video or telephone).   CONSENT FOR VIRTUAL VISIT FOR:  Randy Cannon  By participating in this virtual visit I agree to the following:  I hereby voluntarily request, consent and authorize [facility] and its employed or contracted physicians, physician assistants, nurse practitioners or other licensed health care professionals (the Practitioner), to provide me with telemedicine health care services (the "Services") as deemed necessary by the treating Practitioner. I acknowledge and consent to receive the Services by the Practitioner via telemedicine. I understand that the telemedicine visit will involve communicating with the Practitioner through live audiovisual communication technology and the disclosure of certain medical information by electronic transmission. I acknowledge that I have been given the opportunity to request an in-person assessment or other available alternative prior to the telemedicine visit and am voluntarily participating in the telemedicine visit.  I understand that I have the right to withhold or withdraw my consent to the use of telemedicine in the course of my care at any time, without affecting my right to future care or treatment, and that the Practitioner or I may terminate the telemedicine visit at any time. I understand that I have the right to inspect all information obtained and/or recorded in the course of the telemedicine visit and may receive copies of available information for a reasonable fee.  I understand that some of the potential risks of receiving the Services via telemedicine include:  Delay or interruption in medical evaluation due to technological equipment failure or disruption; Information transmitted may not be  sufficient (e.g. poor resolution of images) to allow for appropriate medical decision making by the Practitioner; and/or  In rare instances, security protocols could fail, causing a breach of personal health information.  Furthermore, I acknowledge that it is my responsibility to provide information about my medical history, conditions and care that is complete and accurate to the best of my ability. I acknowledge that Practitioner's advice, recommendations, and/or decision may be based on factors not within their control, such as incomplete or inaccurate data provided by me or distortions of diagnostic images or specimens that may result from electronic transmissions. I understand that the practice of medicine is not an exact science and that Practitioner makes no warranties or guarantees regarding treatment outcomes. I acknowledge that a copy of this consent can be made available to me via my patient portal ([place]), or I can request a printed copy by calling the office of [facility].    I understand that my insurance will be billed for this visit.   I have read or had this consent read to me. I understand the contents of this consent, which adequately explains the benefits and risks of the Services being provided via telemedicine.  I have been provided ample opportunity to ask questions regarding this consent and the Services and have had my questions answered to my satisfaction. I give my informed consent for the services to be provided through the use of telemedicine in my medical care

## 2022-03-09 NOTE — Telephone Encounter (Signed)
I s/w the pt and he has been scheduled for a tele pre op appt 04/04/22 @ 9:20. Med rec and consent are done.      Patient Consent for Virtual Visit        Randy Cannon has provided verbal consent on 03/09/2022 for a virtual visit (video or telephone).   CONSENT FOR VIRTUAL VISIT FOR:  Randy Cannon  By participating in this virtual visit I agree to the following:  I hereby voluntarily request, consent and authorize [facility] and its employed or contracted physicians, physician assistants, nurse practitioners or other licensed health care professionals (the Practitioner), to provide me with telemedicine health care services (the "Services") as deemed necessary by the treating Practitioner. I acknowledge and consent to receive the Services by the Practitioner via telemedicine. I understand that the telemedicine visit will involve communicating with the Practitioner through live audiovisual communication technology and the disclosure of certain medical information by electronic transmission. I acknowledge that I have been given the opportunity to request an in-person assessment or other available alternative prior to the telemedicine visit and am voluntarily participating in the telemedicine visit.  I understand that I have the right to withhold or withdraw my consent to the use of telemedicine in the course of my care at any time, without affecting my right to future care or treatment, and that the Practitioner or I may terminate the telemedicine visit at any time. I understand that I have the right to inspect all information obtained and/or recorded in the course of the telemedicine visit and may receive copies of available information for a reasonable fee.  I understand that some of the potential risks of receiving the Services via telemedicine include:  Delay or interruption in medical evaluation due to technological equipment failure or disruption; Information transmitted may not be  sufficient (e.g. poor resolution of images) to allow for appropriate medical decision making by the Practitioner; and/or  In rare instances, security protocols could fail, causing a breach of personal health information.  Furthermore, I acknowledge that it is my responsibility to provide information about my medical history, conditions and care that is complete and accurate to the best of my ability. I acknowledge that Practitioner's advice, recommendations, and/or decision may be based on factors not within their control, such as incomplete or inaccurate data provided by me or distortions of diagnostic images or specimens that may result from electronic transmissions. I understand that the practice of medicine is not an exact science and that Practitioner makes no warranties or guarantees regarding treatment outcomes. I acknowledge that a copy of this consent can be made available to me via my patient portal ([facility] MyChart), or I can request a printed copy by calling the office of [facility].    I understand that my insurance will be billed for this visit.   I have read or had this consent read to me. I understand the contents of this consent, which adequately explains the benefits and risks of the Services being provided via telemedicine.  I have been provided ample opportunity to ask questions regarding this consent and the Services and have had my questions answered to my satisfaction. I give my informed consent for the services to be provided through the use of telemedicine in my medical care    

## 2022-04-04 ENCOUNTER — Ambulatory Visit (INDEPENDENT_AMBULATORY_CARE_PROVIDER_SITE_OTHER): Payer: BC Managed Care – PPO | Admitting: Nurse Practitioner

## 2022-04-04 DIAGNOSIS — Z01810 Encounter for preprocedural cardiovascular examination: Secondary | ICD-10-CM

## 2022-04-04 NOTE — Progress Notes (Signed)
Virtual Visit via Telephone Note   Because of Randy Cannon's co-morbid illnesses, he is at least at moderate risk for complications without adequate follow up.  This format is felt to be most appropriate for this patient at this time.  The patient did not have access to video technology/had technical difficulties with video requiring transitioning to audio format only (telephone).  All issues noted in this document were discussed and addressed.  No physical exam could be performed with this format.  Please refer to the patient's chart for his consent to telehealth for [facility].  Evaluation Performed:  Preoperative cardiovascular risk assessment _____________   Date:  04/04/2022   Patient ID:  Randy Cannon, DOB 01/20/1976, MRN [id number] Patient Location:  Home Provider location:   Office  Primary Care Provider:  Sigmund Hazel, MD Primary Cardiologist:  Nicki Guadalajara, MD  Chief Complaint / Patient Profile   46 y.o. y/o male with a h/o CAD s/p STEMI, DES-RCA and Diagonal, PCI-RPDA in 2020, factor V leiden mutation on Xarelto, DVT, hyperlipidemia, and obesity who is pending colonoscopy on 05/25/2022 with [facility] and presents today for telephonic preoperative cardiovascular risk assessment.  Past Medical History    Past Medical History:  Diagnosis Date   Coronary artery disease    DVT (deep venous thrombosis) (HCC)    DVT of proximal leg (deep vein thrombosis) (HCC) 09/07/2011   Factor V Leiden mutation (HCC) 09/07/2011   Hyperlipidemia    Thyroid disease    Past Surgical History:  Procedure Laterality Date   CARDIAC CATHETERIZATION     CORONARY STENT INTERVENTION N/A 03/27/2019   Procedure: CORONARY STENT INTERVENTION;  Surgeon: Lennette Bihari, MD;  Location: [facility] INVASIVE CV LAB;  Service: Cardiovascular;  Laterality: N/A;   CORONARY/GRAFT ACUTE MI REVASCULARIZATION N/A 03/25/2019   Procedure: Coronary/Graft Acute MI Revascularization;  Surgeon: Lennette Bihari, MD;  Location: [facility] INVASIVE CV LAB;  Service: Cardiovascular;  Laterality: N/A;   LEFT HEART CATH AND CORONARY ANGIOGRAPHY N/A 03/25/2019   Procedure: LEFT HEART CATH AND CORONARY ANGIOGRAPHY;  Surgeon: Lennette Bihari, MD;  Location: [facility] INVASIVE CV LAB;  Service: Cardiovascular;  Laterality: N/A;    Allergies  No Known Allergies  History of Present Illness    Randy Cannon is a 46 y.o. male who presents via audio/video conferencing for a telehealth visit today.  Pt was last seen in cardiology clinic on 10/25/2021 by Juanda Crumble, P.A. At that time Garrison Michie was doing well from a cardiac standpoint. The patient is now pending procedure as outlined above. Since his last visit, he has done well from a cardiac standpoint.  He is active and at the time of his telephone visit he is mountain climbing in [place]. He denies chest pain, palpitations, dyspnea, pnd, orthopnea, n, v, dizziness, syncope, edema, weight gain, or early satiety. All other systems reviewed and are otherwise negative except as noted above.   Home Medications    Prior to Admission medications   Medication Sig Start Date End Date Taking? Authorizing Provider  acetaminophen (TYLENOL) 325 MG tablet Take 325-650 mg by mouth every 8 (eight) hours as needed for mild pain, moderate pain or headache.     [provider]  aspirin EC 81 MG tablet Take 81 mg by mouth daily. Swallow whole.    [provider]  atorvastatin (LIPITOR) 80 MG tablet TAKE 1 TABLET(80 MG) BY MOUTH DAILY AT 6 PM 10/16/21  Lennette Bihari, MD  cetirizine (ZYRTEC) 10 MG tablet Take 10 mg by mouth daily as needed (for seasonal allergies).     [provider]  levothyroxine (SYNTHROID) 112 MCG tablet Take 112 mcg by mouth every morning. 02/04/20   [provider]  nitroGLYCERIN (NITROSTAT) 0.4 MG SL tablet Place 1 tablet (0.4 mg total) under the tongue every 5 (five) minutes as needed for chest pain. 03/28/19  03/09/22  Kroeger, Ovidio Kin., PA-C  REPATHA SURECLICK 140 MG/ML SOAJ ADMINISTER 1 ML UNDER THE SKIN EVERY 14 DAYS 08/07/21   Lennette Bihari, MD  rivaroxaban (XARELTO) 20 MG TABS tablet TAKE 1 TABLET(20 MG) BY MOUTH DAILY WITH SUPPER 10/16/21   Lennette Bihari, MD    Physical Exam    Vital Signs:  Khaleel Beckom does not have vital signs available for review today.  Given telephonic nature of communication, physical exam is limited. AAOx3. NAD. Normal affect.  Speech and respirations are unlabored.  Accessory Clinical Findings    None  Assessment & Plan    1.  Preoperative Cardiovascular Risk Assessment: According to the Revised Cardiac Risk Index (RCRI), his Perioperative Risk of Major Cardiac Event is (%): 0.9. His Functional Capacity in METs is: 8.97 according to the Duke Activity Status Index (DASI). Therefore, based on ACC/AHA guidelines, patient would be at acceptable risk for the planned procedure without further cardiovascular testing.  Patient with diagnosis of prior DVTs and Factor V Leiden deficiency on Xarelto for anticoagulation.     Procedure: colonoscopy Date of procedure: 05/25/22   CrCl >152mL/min Platelet count 265K   Per office protocol, patient can hold Xarelto for 1 day prior to procedure. He should resume as soon as safely possible after.  A copy of this note will be routed to requesting surgeon.  Time:   Today, I have spent 5 minutes with the patient with telehealth technology discussing medical history, symptoms, and management plan.     Joylene Grapes, NP  04/04/2022, 9:19 AM

## 2022-05-17 ENCOUNTER — Telehealth: Payer: Self-pay | Admitting: *Deleted

## 2022-05-17 NOTE — Telephone Encounter (Signed)
Received a call from Amy from [facility], regarding pt and scheduled Colonoscopy scheduled 05/25/22. They are asking for pt to hold Xarelto 3 days prior to procedure.  They have the clearance and it states 1 day.  She advised protocol for Xarelto was 3 days and she wanted to see if pt can be cleared for that?  Will send back to preop to address.

## 2022-05-17 NOTE — Telephone Encounter (Signed)
Will route back to Amy @ [facility].

## 2022-05-17 NOTE — Telephone Encounter (Signed)
   Patient Name: Randy Cannon  DOB: [birth year] MRN: [id number]  Primary Cardiologist: Nicki Guadalajara, MD  Chart reviewed as part of pre-operative protocol coverage.   As previously noted, per our office protocol, patient may hold Xarelto for 1 day.   Preoperative coverage team, please notify requesting office. I will remove request from preop pool.   Thank you.   Joylene Grapes, NP 05/17/2022, 11:37 AM

## 2022-05-25 DIAGNOSIS — Z1211 Encounter for screening for malignant neoplasm of colon: Secondary | ICD-10-CM | POA: Diagnosis not present

## 2022-05-25 DIAGNOSIS — K5730 Diverticulosis of large intestine without perforation or abscess without bleeding: Secondary | ICD-10-CM | POA: Diagnosis not present

## 2022-08-07 ENCOUNTER — Other Ambulatory Visit: Payer: Self-pay | Admitting: Cardiovascular Disease

## 2022-08-07 DIAGNOSIS — E785 Hyperlipidemia, unspecified: Secondary | ICD-10-CM

## 2022-08-07 DIAGNOSIS — I2111 ST elevation (STEMI) myocardial infarction involving right coronary artery: Secondary | ICD-10-CM

## 2022-09-30 ENCOUNTER — Other Ambulatory Visit: Payer: Self-pay | Admitting: Cardiovascular Disease

## 2022-09-30 DIAGNOSIS — I824Y9 Acute embolism and thrombosis of unspecified deep veins of unspecified proximal lower extremity: Secondary | ICD-10-CM

## 2022-10-12 DIAGNOSIS — B9689 Other specified bacterial agents as the cause of diseases classified elsewhere: Secondary | ICD-10-CM | POA: Diagnosis not present

## 2022-10-12 DIAGNOSIS — R0981 Nasal congestion: Secondary | ICD-10-CM | POA: Diagnosis not present

## 2022-10-12 DIAGNOSIS — R059 Cough, unspecified: Secondary | ICD-10-CM | POA: Diagnosis not present

## 2022-10-12 DIAGNOSIS — J208 Acute bronchitis due to other specified organisms: Secondary | ICD-10-CM | POA: Diagnosis not present

## 2022-10-30 ENCOUNTER — Other Ambulatory Visit: Payer: Self-pay | Admitting: Cardiovascular Disease

## 2022-11-06 ENCOUNTER — Other Ambulatory Visit: Payer: Self-pay | Admitting: Cardiovascular Disease

## 2022-11-06 DIAGNOSIS — I2111 ST elevation (STEMI) myocardial infarction involving right coronary artery: Secondary | ICD-10-CM

## 2022-11-06 DIAGNOSIS — E785 Hyperlipidemia, unspecified: Secondary | ICD-10-CM

## 2022-12-05 DIAGNOSIS — Z0000 Encounter for general adult medical examination without abnormal findings: Secondary | ICD-10-CM | POA: Diagnosis not present

## 2022-12-06 DIAGNOSIS — Z125 Encounter for screening for malignant neoplasm of prostate: Secondary | ICD-10-CM | POA: Diagnosis not present

## 2022-12-06 DIAGNOSIS — E039 Hypothyroidism, unspecified: Secondary | ICD-10-CM | POA: Diagnosis not present

## 2022-12-06 DIAGNOSIS — D6851 Activated protein C resistance: Secondary | ICD-10-CM | POA: Diagnosis not present

## 2022-12-06 DIAGNOSIS — E7800 Pure hypercholesterolemia, unspecified: Secondary | ICD-10-CM | POA: Diagnosis not present

## 2022-12-06 DIAGNOSIS — D6869 Other thrombophilia: Secondary | ICD-10-CM | POA: Diagnosis not present

## 2023-01-31 ENCOUNTER — Other Ambulatory Visit: Payer: Self-pay | Admitting: Cardiovascular Disease

## 2023-01-31 DIAGNOSIS — I2111 ST elevation (STEMI) myocardial infarction involving right coronary artery: Secondary | ICD-10-CM

## 2023-01-31 DIAGNOSIS — E785 Hyperlipidemia, unspecified: Secondary | ICD-10-CM

## 2023-03-27 ENCOUNTER — Other Ambulatory Visit: Payer: Self-pay | Admitting: Cardiovascular Disease

## 2023-03-27 DIAGNOSIS — I824Y9 Acute embolism and thrombosis of unspecified deep veins of unspecified proximal lower extremity: Secondary | ICD-10-CM

## 2023-03-27 NOTE — Telephone Encounter (Signed)
Prescription refill request for Xarelto received.  Indication: DVT Last office visit: 04/04/22 ([facility])  Weight: 108.9kg Age: 47 Scr:1.05 (12/06/22 via [facility])  CrCl: 132.38ml/min  Appropriate dose. Refill sent.

## 2023-04-16 ENCOUNTER — Ambulatory Visit: Payer: BC Managed Care – PPO | Attending: Nurse Practitioner | Admitting: Nurse Practitioner

## 2023-04-16 ENCOUNTER — Encounter: Payer: Self-pay | Admitting: Nurse Practitioner

## 2023-04-16 VITALS — BP 112/70 | HR 58 | Ht 71.0 in | Wt 240.0 lb

## 2023-04-16 DIAGNOSIS — E785 Hyperlipidemia, unspecified: Secondary | ICD-10-CM | POA: Diagnosis not present

## 2023-04-16 DIAGNOSIS — E669 Obesity, unspecified: Secondary | ICD-10-CM

## 2023-04-16 DIAGNOSIS — E039 Hypothyroidism, unspecified: Secondary | ICD-10-CM

## 2023-04-16 DIAGNOSIS — D6851 Activated protein C resistance: Secondary | ICD-10-CM

## 2023-04-16 DIAGNOSIS — I2510 Atherosclerotic heart disease of native coronary artery without angina pectoris: Secondary | ICD-10-CM

## 2023-04-16 DIAGNOSIS — Z86718 Personal history of other venous thrombosis and embolism: Secondary | ICD-10-CM

## 2023-04-16 MED ORDER — NITROGLYCERIN 0.4 MG SL SUBL: 0.4000 mg | SUBLINGUAL_TABLET | SUBLINGUAL | 3 refills | Status: DC | PRN

## 2023-04-16 NOTE — Patient Instructions (Signed)
Medication Instructions:  Your physician recommends that you continue on your current medications as directed. Please refer to the Current Medication list given to you today.  *If you need a refill on your cardiac medications before your next appointment, please call your pharmacy*   Lab Work: Fasting Lipid panel & LFTs at your convenience.    Testing/Procedures: NONE ordered at this time of appointment     Follow-Up: At [facility], you and your health needs are our priority.  As part of our continuing mission to provide you with exceptional heart care, we have created designated Provider Care Teams.  These Care Teams include your primary Cardiologist (physician) and Advanced Practice Providers (APPs -  Physician Assistants and Nurse Practitioners) who all work together to provide you with the care you need, when you need it.  We recommend signing up for the patient portal called "MyChart".  Sign up information is provided on this After Visit Summary.  MyChart is used to connect with patients for Virtual Visits (Telemedicine).  Patients are able to view lab/test results, encounter notes, upcoming appointments, etc.  Non-urgent messages can be sent to your provider as well.   To learn more about what you can do with MyChart, go to [email or web address].    Your next appointment:   6 month(s)  Provider:   Nicki Guadalajara, MD

## 2023-04-16 NOTE — Progress Notes (Unsigned)
Office Visit    Patient Name: Randy Cannon Date of Encounter: 04/16/2023  Primary Care Provider:  Sigmund Hazel, MD Primary Cardiologist:  Nicki Guadalajara, MD  Chief Complaint    47 year old male with a history of CAD s/p NSTEMI, DES-RCA, diagonal, PCI-RPDA in 2020, DVT, factor V Leiden mutation on chronic Xarelto, hyperlipidemia, hypothyroidism, and obesity who presents for follow-up related to CAD.  Past Medical History    Past Medical History:  Diagnosis Date   Coronary artery disease    DVT (deep venous thrombosis) (HCC)    DVT of proximal leg (deep vein thrombosis) (HCC) 09/07/2011   Factor V Leiden mutation (HCC) 09/07/2011   Hyperlipidemia    Thyroid disease    Past Surgical History:  Procedure Laterality Date   CARDIAC CATHETERIZATION     CORONARY STENT INTERVENTION N/A 03/27/2019   Procedure: CORONARY STENT INTERVENTION;  Surgeon: Lennette Bihari, MD;  Location: [facility] INVASIVE CV LAB;  Service: Cardiovascular;  Laterality: N/A;   CORONARY/GRAFT ACUTE MI REVASCULARIZATION N/A 03/25/2019   Procedure: Coronary/Graft Acute MI Revascularization;  Surgeon: Lennette Bihari, MD;  Location: [facility] INVASIVE CV LAB;  Service: Cardiovascular;  Laterality: N/A;   LEFT HEART CATH AND CORONARY ANGIOGRAPHY N/A 03/25/2019   Procedure: LEFT HEART CATH AND CORONARY ANGIOGRAPHY;  Surgeon: Lennette Bihari, MD;  Location: [facility] INVASIVE CV LAB;  Service: Cardiovascular;  Laterality: N/A;    Allergies  No Known Allergies   Labs/Other Studies Reviewed    The following studies were reviewed today:  Cardiac Studies & Procedures   CARDIAC CATHETERIZATION  CARDIAC CATHETERIZATION 03/27/2019  Narrative  1st Diag lesion is 90% stenosed.  Mid Cx to Dist Cx lesion is 100% stenosed.  1st RPL lesion is 80% stenosed.  RPDA lesion is 80% stenosed.  Dist RCA lesion is 80% stenosed.  Ramus lesion is 70% stenosed.  Post intervention, there is a 10% residual stenosis.  Previously placed Prox RCA-2  drug eluting stent is widely patent.  Balloon angioplasty was performed.  Prox RCA-1 lesion is 20% stenosed.  Post intervention, there is a 0% residual stenosis.  Post intervention, there is a 0% residual stenosis.  Post intervention, there is a 0% residual stenosis.  Post intervention, there is a 0% residual stenosis.  3rd RPL-1 lesion is 80% stenosed.  Post intervention, there is a 50% residual stenosis.  3rd RPL-2 lesion is 80% stenosed.  A stent was successfully placed.  A stent was successfully placed.  A stent was successfully placed.  Successful multi lesion intervention evolving a trifurcation stenosis of the distal RCA, ostial PDA1, ostial PDA2 and ostial PLA vessel with Cutting Balloon intervention at all sites and ultimate stenting of the distal RCA into the ostium of the PDA2 with a 3.0 x 12 mm Resolute Onyx stent with the 80% stenoses being reduced to 0%; Cutting Balloon to the PDA 1 with the 80% stenosis reduced to 5%.  Following cutting balloon to the PLA vessel the ostial lesion was reduced to less than 5% but following stenting into the PDA 2 vessel there was some ostial narrowing of the PLA vessel 50 to 60%.  Repeat noncompliant dilatation in the proximal portion of the previously placed 4.0 x 22 mm Resolute stent which was additionally postdilated today to 4.56 mm with improvement in the mild digital narrowing to 0%.  Successful PCI/DES stenting of the diagonal vessel with insertion of a 2.5 x 15 mm Resolute stent postdilated to 2.75 mm with the 80% stenosis being reduced  to 0%.  RECOMMENDATION; The patient will be restarted on Xarelto tomorrow and for the first month continue aspirin/Plavix and Xarelto with discontinuance of aspirin after 1 month.  Adequate therapy for the patient's totally occluded left circumflex vessel with left to left collaterals as well as the ramus intermediate vessel.  65 to 70% stenosed.  Aggressive lipid-lowering therapy with target LDL  less than 70 and if he cannot achieve this would recommend PCSK9 inhibition for aggressive treatment.  Findings Coronary Findings Diagnostic  Dominance: Right  Left Anterior Descending  First Diagonal Branch 1st Diag lesion is 90% stenosed.  Ramus Intermedius Ramus lesion is 70% stenosed.  Left Circumflex Mid Cx to Dist Cx lesion is 100% stenosed.  First Obtuse Marginal Branch Vessel is small in size.  Third Obtuse Marginal Branch Collaterals 3rd Mrg filled by collaterals from Dist LAD.  Right Coronary Artery Prox RCA-1 lesion is 20% stenosed. Previously placed Prox RCA-2 drug eluting stent is widely patent. Dist RCA lesion is 80% stenosed.  Right Posterior Descending Artery RPDA lesion is 80% stenosed.  Right Posterior Atrioventricular Artery  First Right Posterolateral Branch 1st RPL lesion is 80% stenosed.  Third Right Posterolateral Branch Collaterals 3rd RPL filled by collaterals from Dist LAD.  3rd RPL-1 lesion is 80% stenosed. 3rd RPL-2 lesion is 80% stenosed.  Intervention  1st Diag lesion Stent A stent was successfully placed. Post-Intervention Lesion Assessment The intervention was successful. Pre-interventional TIMI flow is 3. Post-intervention TIMI flow is 3. No complications occurred at this lesion. There is a 0% residual stenosis post intervention.  Prox RCA-1 lesion Angioplasty Post-Intervention Lesion Assessment The intervention was successful. Pre-interventional TIMI flow is 3. Post-intervention TIMI flow is 3. No complications occurred at this lesion. There is a 0% residual stenosis post intervention.  Dist RCA lesion Stent A stent was successfully placed. Post-Intervention Lesion Assessment The intervention was successful. Pre-interventional TIMI flow is 3. Post-intervention TIMI flow is 3. There is a 0% residual stenosis post intervention.  RPDA lesion Angioplasty Post-Intervention Lesion Assessment The intervention was  successful. Pre-interventional TIMI flow is 3. Post-intervention TIMI flow is 3. No complications occurred at this lesion. There is a 10% residual stenosis post intervention.  1st RPL lesion Stent A stent was successfully placed. Post-Intervention Lesion Assessment The intervention was successful. Pre-interventional TIMI flow is 3. Post-intervention TIMI flow is 3. No complications occurred at this lesion. There is a 0% residual stenosis post intervention.  3rd RPL-1 lesion Angioplasty Post-Intervention Lesion Assessment The intervention was successful. Pre-interventional TIMI flow is 3. Post-intervention TIMI flow is 3. There is a 50% residual stenosis post intervention.   CARDIAC CATHETERIZATION 03/25/2019  Narrative  1st RPL lesion is 80% stenosed.  RPDA lesion is 80% stenosed.  Dist RCA lesion is 80% stenosed.  Prox RCA lesion is 100% stenosed.  Ramus lesion is 70% stenosed.  1st Diag lesion is 90% stenosed.  Mid Cx to Dist Cx lesion is 100% stenosed.  There is mild left ventricular systolic dysfunction.  LV end diastolic pressure is normal.  The left ventricular ejection fraction is 50-55% by visual estimate.  3rd RPL-1 lesion is 60% stenosed.  3rd RPL-2 lesion is 100% stenosed.  A drug-eluting stent was successfully placed.  Post intervention, there is a 0% residual stenosis.  Acute inferior ST segment elevation myocardial infarction secondary to total occlusion of a very large dominant RCA proximally with extensive thrombus burden.  Multivessel CAD with percent ostial tapering of a long left main; 90% proximal stenosis in a very  proximal diagonal branch of the LAD with a normal LAD extending to the apex; 70% proximal stenosis in the ramus intermediate vessel; total occlusion of the mid left circumflex coronary artery with retrograde filling of the distal obtuse marginal branch from LAD collaterals; and total proximal occlusion of a very large dominant RCA with  extensive thrombus burden there is some faint collateralization to the PDA a and PLA vessels from the left coronary circulation.  Difficult but successful PCI to the large RCA requiring PTCA, extensive thrombectomy, Aggrastat with reperfusion induced hypotension and bradycardia necessitating significant fluid resuscitation, atropine and Levophed with ultimate insertion of a 4.0 x 22 mm Resolute Onyx DES stent postdilated to 4.4 mm with the 100% occlusion being reduced to 0% and resumption of brisk TIMI-3 flow.  There is a very complex 80% trifurcation stenosis in the distal RCA at the takeoff of the PDA large inferior LV branch and continuation branch with thrombus in the mid PLA just collateralized from the left circulation.  Mild acute LV dysfunction with inferobasal hypocontractility.  RECOMMENDATION: The patient left the catheterization laboratory on Cangrelor which will be discontinued in the CCU with subsequent administration of Plavix 600 mg daily.  Patient will be maintained on Aggrastat for at least 24 hours and possibly until Friday a.m. prior to subsequent planned intervention to the complex trifurcation distal LAD stenosis and possibly the diagonal and ramus vessels.  Will ask colleagues to review. At present Xarelto will be held but ultimately he will need to be on triple drug therapy with aspirin/Plavix and Xarelto for 1 month and then Plavix/Xarelto.  Plan echo Doppler in a.m.  Titrate to maximum statin dose.  With the patient's history of significant hyperlipidemia at a young age consider possible familial hyperlipidemia and if LDL cannot reach target would initiate PCSK9 inhibition.  Findings Coronary Findings Diagnostic  Dominance: Right  Left Anterior Descending  First Diagonal Branch 1st Diag lesion is 90% stenosed.  Ramus Intermedius Ramus lesion is 70% stenosed.  Left Circumflex Mid Cx to Dist Cx lesion is 100% stenosed.  First Obtuse Marginal Branch Vessel is small in  size.  Third Obtuse Marginal Branch Collaterals 3rd Mrg filled by collaterals from Dist LAD.  Right Coronary Artery Prox RCA lesion is 100% stenosed. Dist RCA lesion is 80% stenosed.  Right Posterior Descending Artery RPDA lesion is 80% stenosed.  Right Posterior Atrioventricular Artery  First Right Posterolateral Branch 1st RPL lesion is 80% stenosed.  Third Right Posterolateral Branch Collaterals 3rd RPL filled by collaterals from Dist LAD.  3rd RPL-1 lesion is 60% stenosed. 3rd RPL-2 lesion is 100% stenosed.  Intervention  Prox RCA lesion Stent Lesion crossed with guidewire. Pre-stent angioplasty was performed. A drug-eluting stent was successfully placed. Stent strut is well apposed. Post-Intervention Lesion Assessment The intervention was successful. Pre-interventional TIMI flow is 0. Post-intervention TIMI flow is 3. At this lesion, a thrombus occurred. There is a 0% residual stenosis post intervention.     ECHOCARDIOGRAM  ECHOCARDIOGRAM COMPLETE 03/26/2019  Narrative ECHOCARDIOGRAM REPORT    Patient Name:   Randy Cannon Date of Exam: 03/26/2019 Medical Rec #:  [id number]      Height:       71.0 in Accession #:    [id number]     Weight:       234.1 lb Date of Birth:  [birth year]     BSA:          2.25 m Patient Age:    19 years  BP:           116/78 mmHg Patient Gender: M              HR:           82 bpm. Exam Location:  Inpatient   Procedure: 2D Echo  Indications:    acute myocardial infarction 410  History:        Patient has no prior history of Echocardiogram examinations.  Sonographer:    Delcie Roch Referring Phys: [phone] THOMAS A KELLY  IMPRESSIONS   1. The left ventricle has low normal systolic function, with an ejection fraction of 50-55%. The cavity size was normal. Left ventricular diastolic parameters were normal. 2. Inferior hypokinesis, particularly in the basal segment. 3. The right ventricle has moderately reduced  systolic function. The cavity was normal. There is no increase in right ventricular wall thickness. 4. The mitral valve is abnormal. Mild thickening of the mitral valve leaflet. There is mild mitral annular calcification present. 5. The tricuspid valve is grossly normal. 6. The aortic valve is tricuspid. Mild thickening of the aortic valve. 7. The inferior vena cava was dilated in size with <50% respiratory variability. 8. The interatrial septum was not assessed.  FINDINGS Left Ventricle: The left ventricle has low normal systolic function, with an ejection fraction of 50-55%. The cavity size was normal. There is no increase in left ventricular wall thickness. Left ventricular diastolic parameters were normal. Inferior hypokinesis, particularly in the basal segment.  Right Ventricle: The right ventricle has moderately reduced systolic function. The cavity was normal. There is no increase in right ventricular wall thickness.  Left Atrium: Left atrial size was normal in size.  Right Atrium: Right atrial size was normal in size. Right atrial pressure is estimated at 10 mmHg.  Interatrial Septum: The interatrial septum was not assessed.  Pericardium: There is no evidence of pericardial effusion.  Mitral Valve: The mitral valve is abnormal. Mild thickening of the mitral valve leaflet. There is mild mitral annular calcification present. Mitral valve regurgitation is trivial by color flow Doppler.  Tricuspid Valve: The tricuspid valve is grossly normal. Tricuspid valve regurgitation is trivial by color flow Doppler.  Aortic Valve: The aortic valve is tricuspid Mild thickening of the aortic valve. Aortic valve regurgitation was not visualized by color flow Doppler.  Pulmonic Valve: The pulmonic valve was normal in structure. Pulmonic valve regurgitation is trivial by color flow Doppler.  Venous: The inferior vena cava is dilated in size with less than 50% respiratory  variability.   +--------------+--------++ LEFT VENTRICLE         +----------------+----------++ +--------------+--------++ Diastology                 PLAX 2D                +----------------+----------++ +--------------+--------++ LV e' lateral:  12.60 cm/s LVIDd:        4.75 cm  +----------------+----------++ +--------------+--------++ LV E/e' lateral:5.3        LVIDs:        3.60 cm  +----------------+----------++ +--------------+--------++ LV e' medial:   9.25 cm/s  LV PW:        1.10 cm  +----------------+----------++ +--------------+--------++ LV E/e' medial: 7.3        LV IVS:       1.05 cm  +----------------+----------++ +--------------+--------++ LVOT diam:    2.80 cm  +--------------+--------++ LV SV:        50 ml    +--------------+--------++ LV SV Index:  21.59    +--------------+--------++ LVOT Area:    6.16 cm +--------------+--------++                        +--------------+--------++  +---------------+----------++ RIGHT VENTRICLE           +---------------+----------++ RV S prime:    12.70 cm/s +---------------+----------++ TAPSE (M-mode):1.6 cm     +---------------+----------++  +---------------+-------++-----------++ LEFT ATRIUM           Index       +---------------+-------++-----------++ LA diam:       4.40 cm1.95 cm/m  +---------------+-------++-----------++ LA Vol (A2C):  43.2 ml19.16 ml/m +---------------+-------++-----------++ LA Vol (A4C):  64.1 ml28.43 ml/m +---------------+-------++-----------++ LA Biplane Vol:52.4 ml23.24 ml/m +---------------+-------++-----------++ +------------+---------++-----------++ RIGHT ATRIUM         Index       +------------+---------++-----------++ RA Pressure:3.00 mmHg            +------------+---------++-----------++ RA Area:    14.40 cm             +------------+---------++-----------++ RA Volume:  34.90 ml 15.48 ml/m +------------+---------++-----------++ +------------+-----------++ AORTIC VALVE            +------------+-----------++ LVOT Vmax:  88.80 cm/s  +------------+-----------++ LVOT Vmean: 59.400 cm/s +------------+-----------++ LVOT VTI:   0.179 m     +------------+-----------++  +-------------+-------++ AORTA                +-------------+-------++ Ao Root diam:3.20 cm +-------------+-------++  +--------------+----------++ +---------------+---------++ MITRAL VALVE             TRICUSPID VALVE          +--------------+----------++ +---------------+---------++ MV Area (PHT):4.06 cm   Estimated RAP: 3.00 mmHg +--------------+----------++ +---------------+---------++ MV PHT:       54.23 msec +--------------+----------++ +--------------+-------+ MV Decel Time:187 msec   SHUNTS                +--------------+----------++ +--------------+-------+ +--------------+----------++ Systemic VTI: 0.18 m  MV E velocity:67.30 cm/s +--------------+-------+ +--------------+----------++ Systemic Diam:2.80 cm MV A velocity:60.00 cm/s +--------------+-------+ +--------------+----------++ MV E/A ratio: 1.12       +--------------+----------++   Dietrich Pates MD Electronically signed by Dietrich Pates MD Signature Date/Time: 03/26/2019/3:38:58 PM    Final            Recent Labs: No results found for requested labs within last 365 days.  Recent Lipid Panel    Component Value Date/Time   CHOL 104 11/30/2020 0836   TRIG 151 (H) 11/30/2020 0836   HDL 35 (L) 11/30/2020 0836   CHOLHDL 3.0 11/30/2020 0836   CHOLHDL 4.2 03/25/2019 2217   VLDL 20 03/25/2019 2217   LDLCALC 43 11/30/2020 0836    History of Present Illness    47 year old male with the above past medical history including CAD s/p NSTEMI, DES-RCA, diagonal, PCI-RPDA in 2020, DVT, factor V  Leiden mutation on chronic Xarelto, hyperlipidemia, hypothyroidism, and obesity.  He has a history of heterozygous factor V Leiden deficiency with remote DVTs in 2006 in 2012 s/p IVC filter on chronic Xarelto.  He was hospitalized in 2020 in the setting of STEMI s/p DES-RCA, diagonal, PCI-RPDA.  Echocardiogram at the time showed EF 50 to 55%, low normal LV function, inferior hypokinesis, moderately reduced RV systolic function, mild thickening of the mitral valve leaflet.  He was last seen in the office on 10/25/2021 and was doing well from a cardiac standpoint.  He was seen virtually on 04/04/2022 for preoperative cardiac evaluation.   He presents today for follow-up.  Since his last visit he has done well from a cardiac standpoint.  He denies any symptoms concerning for angina.  He is active and exercises regularly.  He has noted some elevated BP readings at home with his blood pressure cuff.  BP has been stable in the office.  He is no longer taking blood pressure medication.  Question accuracy of BP cuff.  Advised him to continue to monitor BP and report BP consistent greater than 130/80.  Recommend he bring his blood pressure cuff to next follow-up visit if ongoing concerns.  Will update fasting lipid panel, LFTs.  He was not taking Repatha at the time and was not fasting at time of his most recent lipid panel.  Follow-up in 6 months, sooner if needed.  1. CAD:  S/p NSTEMI, DES-RCA, diagonal, PCI-RPDA in 2020. Stable with no anginal symptoms. No indication for ischemic evaluation.   2. History of DVT/factor V Leiden mutation:    3. Hyperlipidemia: LDL was 142 in 11/2022.   4. Hypothyroidism: TSH was 2.88 in 11/2022.  Monitor managed per PCP.  5. Obesity: Encouraged ongoing lifestyle modification with diet and exercise.  6. Disposition: Follow-up in   Home Medications    Current Outpatient Medications  Medication Sig Dispense Refill   acetaminophen (TYLENOL) 325 MG tablet Take 325-650 mg by  mouth every 8 (eight) hours as needed for mild pain, moderate pain or headache.      aspirin EC 81 MG tablet Take 81 mg by mouth daily. Swallow whole.     atorvastatin (LIPITOR) 80 MG tablet TAKE 1 TABLET DAILY AT 6 P.M. 90 tablet 2   cetirizine (ZYRTEC) 10 MG tablet Take 10 mg by mouth daily as needed (for seasonal allergies).      Evolocumab (REPATHA SURECLICK) 140 MG/ML SOAJ ADMINISTER 1 ML UNDER THE SKIN EVERY 14 DAYS 6 mL 3   levothyroxine (SYNTHROID) 112 MCG tablet Take 112 mcg by mouth every morning.     rivaroxaban (XARELTO) 20 MG TABS tablet TAKE 1 TABLET DAILY WITH SUPPER 90 tablet 1   nitroGLYCERIN (NITROSTAT) 0.4 MG SL tablet Place 1 tablet (0.4 mg total) under the tongue every 5 (five) minutes as needed for chest pain. (Patient not taking: Reported on 04/16/2023) 25 tablet 3   No current facility-administered medications for this visit.     Review of Systems    ***.  All other systems reviewed and are otherwise negative except as noted above.    Physical Exam    VS:  BP 112/70 (BP Location: Left Arm, Patient Position: Sitting, Cuff Size: Large)   Pulse (!) 58   Ht 5\' 11"  (1.803 m)   Wt 240 lb (108.9 kg)   SpO2 97%   BMI 33.47 kg/m   GEN: Well nourished, well developed, in no acute distress. HEENT: normal. Neck: Supple, no JVD, carotid bruits, or masses. Cardiac: RRR, no murmurs, rubs, or gallops. No clubbing, cyanosis, edema.  Radials/DP/PT 2+ and equal bilaterally.  Respiratory:  Respirations regular and unlabored, clear to auscultation bilaterally. GI: Soft, nontender, nondistended, BS + x 4. MS: no deformity or atrophy. Skin: warm and dry, no rash. Neuro:  Strength and sensation are intact. Psych: Normal affect.  Accessory Clinical Findings    ECG personally reviewed by me today -    - no acute changes.   Lab Results  Component Value Date   WBC 6.8 10/18/2021   HGB 16.6 10/18/2021   HCT 48.3 10/18/2021   MCV 87 10/18/2021  PLT 265 10/18/2021   Lab  Results  Component Value Date   CREATININE 0.99 10/18/2021   BUN 14 10/18/2021   NA 142 10/18/2021   K 4.2 10/18/2021   CL 102 10/18/2021   CO2 21 10/18/2021   Lab Results  Component Value Date   ALT 53 (H) 11/30/2020   AST 25 11/30/2020   ALKPHOS 67 11/30/2020   BILITOT 0.5 11/30/2020   Lab Results  Component Value Date   CHOL 104 11/30/2020   HDL 35 (L) 11/30/2020   LDLCALC 43 11/30/2020   TRIG 151 (H) 11/30/2020   CHOLHDL 3.0 11/30/2020    Lab Results  Component Value Date   HGBA1C 5.2 04/21/2011    Assessment & Plan    1.  ***      Joylene Grapes, NP 04/16/2023, 2:34 PM

## 2023-04-17 ENCOUNTER — Encounter: Payer: Self-pay | Admitting: Nurse Practitioner

## 2023-04-23 DIAGNOSIS — I2510 Atherosclerotic heart disease of native coronary artery without angina pectoris: Secondary | ICD-10-CM | POA: Diagnosis not present

## 2023-04-23 DIAGNOSIS — E785 Hyperlipidemia, unspecified: Secondary | ICD-10-CM | POA: Diagnosis not present

## 2023-04-23 DIAGNOSIS — D6851 Activated protein C resistance: Secondary | ICD-10-CM | POA: Diagnosis not present

## 2023-04-23 DIAGNOSIS — Z86718 Personal history of other venous thrombosis and embolism: Secondary | ICD-10-CM | POA: Diagnosis not present

## 2023-04-24 LAB — HEPATIC FUNCTION PANEL
ALT: 48 IU/L — ABNORMAL HIGH (ref 0–44)
AST: 24 IU/L (ref 0–40)
Albumin: 4.7 g/dL (ref 4.1–5.1)
Alkaline Phosphatase: 86 IU/L (ref 44–121)
Bilirubin Total: 0.6 mg/dL (ref 0.0–1.2)
Bilirubin, Direct: 0.2 mg/dL (ref 0.00–0.40)
Total Protein: 7 g/dL (ref 6.0–8.5)

## 2023-04-25 ENCOUNTER — Other Ambulatory Visit: Payer: Self-pay

## 2023-04-25 ENCOUNTER — Other Ambulatory Visit (HOSPITAL_COMMUNITY): Payer: Self-pay

## 2023-05-07 ENCOUNTER — Other Ambulatory Visit: Payer: Self-pay

## 2023-05-07 DIAGNOSIS — E785 Hyperlipidemia, unspecified: Secondary | ICD-10-CM

## 2023-05-21 ENCOUNTER — Telehealth: Payer: Self-pay | Admitting: Cardiovascular Disease

## 2023-05-21 DIAGNOSIS — E785 Hyperlipidemia, unspecified: Secondary | ICD-10-CM | POA: Diagnosis not present

## 2023-05-21 LAB — LIPID PANEL
Chol/HDL Ratio: 2.4 ratio (ref 0.0–5.0)
Cholesterol, Total: 89 mg/dL — ABNORMAL LOW (ref 100–199)
HDL: 37 mg/dL — ABNORMAL LOW (ref >39)
LDL Chol Calc (NIH): 25 mg/dL (ref 0–99)
Triglycerides: 160 mg/dL — ABNORMAL HIGH (ref 0–149)
VLDL Cholesterol Cal: 27 mg/dL (ref 5–40)

## 2023-05-21 NOTE — Telephone Encounter (Signed)
Caller stated patient is in her lab and will need orders released for patient's blood work.

## 2023-05-21 NOTE — Telephone Encounter (Signed)
[facility] called to have lab orders released. This nursed released the lab.

## 2023-07-31 DIAGNOSIS — M25532 Pain in left wrist: Secondary | ICD-10-CM | POA: Diagnosis not present

## 2023-08-06 DIAGNOSIS — R0981 Nasal congestion: Secondary | ICD-10-CM | POA: Diagnosis not present

## 2023-08-06 DIAGNOSIS — R509 Fever, unspecified: Secondary | ICD-10-CM | POA: Diagnosis not present

## 2023-08-06 DIAGNOSIS — R059 Cough, unspecified: Secondary | ICD-10-CM | POA: Diagnosis not present

## 2023-08-08 DIAGNOSIS — M67432 Ganglion, left wrist: Secondary | ICD-10-CM | POA: Diagnosis not present

## 2023-09-11 ENCOUNTER — Other Ambulatory Visit: Payer: Self-pay | Admitting: Cardiovascular Disease

## 2023-09-11 DIAGNOSIS — I824Y9 Acute embolism and thrombosis of unspecified deep veins of unspecified proximal lower extremity: Secondary | ICD-10-CM

## 2023-09-11 NOTE — Telephone Encounter (Addendum)
Xarelto 20mg  refill request received. Pt is 47 years old, weight-108.9kg, Crea-0.99 on 10/18/21-needs labs, last seen by Bernadene Person on 04/16/23, Diagnosis-DVT & Factor V Leiden, CrCl-139.03 mL/min; Dose is appropriate based on dosing criteria.     Per last OV note: History of DVT/factor V Leiden mutation: Previously followed with hematology/oncology.  Dr. Tresa Endo has been prescribing his Xarelto since. Continue Xarelto.   Called pt's PCP and requested latest labs; message was taken and will be sent to the nurse per the Operator.   Received labs from PCP and Crea-1.05 on 12/06/22; CrCl-131.08 mL/min. Will send in refill to requested pharmacy.

## 2023-10-07 DIAGNOSIS — M25522 Pain in left elbow: Secondary | ICD-10-CM | POA: Diagnosis not present

## 2023-10-07 DIAGNOSIS — M25561 Pain in right knee: Secondary | ICD-10-CM | POA: Diagnosis not present

## 2023-10-15 NOTE — Therapy (Signed)
 OUTPATIENT PHYSICAL THERAPY LOWER EXTREMITY EVALUATION   Patient Name: Randy Cannon MRN: [id number] DOB:11-Oct-1975, 48 y.o., male Today's Date: 10/16/2023  END OF SESSION:  PT End of Session - 10/16/23 1401     Visit Number 1    Number of Visits 4    Date for PT Re-Evaluation 11/13/23    Authorization Type BCBS    PT Start Time 1402    PT Stop Time 1500    PT Time Calculation (min) 58 min    Activity Tolerance Patient tolerated treatment well             Past Medical History:  Diagnosis Date   Coronary artery disease    DVT (deep venous thrombosis) (HCC)    DVT of proximal leg (deep vein thrombosis) (HCC) 09/07/2011   Factor V Leiden mutation (HCC) 09/07/2011   Hyperlipidemia    Thyroid  disease    Past Surgical History:  Procedure Laterality Date   CARDIAC CATHETERIZATION     CORONARY STENT INTERVENTION N/A 03/27/2019   Procedure: CORONARY STENT INTERVENTION;  Surgeon: Millicent Ally, MD;  Location: [facility] INVASIVE CV LAB;  Service: Cardiovascular;  Laterality: N/A;   CORONARY/GRAFT ACUTE MI REVASCULARIZATION N/A 03/25/2019   Procedure: Coronary/Graft Acute MI Revascularization;  Surgeon: Millicent Ally, MD;  Location: [facility] INVASIVE CV LAB;  Service: Cardiovascular;  Laterality: N/A;   LEFT HEART CATH AND CORONARY ANGIOGRAPHY N/A 03/25/2019   Procedure: LEFT HEART CATH AND CORONARY ANGIOGRAPHY;  Surgeon: Millicent Ally, MD;  Location: [facility] INVASIVE CV LAB;  Service: Cardiovascular;  Laterality: N/A;   Patient Active Problem List   Diagnosis Date Noted   Hyperlipidemia LDL goal <70 06/29/2019   Coronary artery disease involving native coronary artery of native heart with unstable angina pectoris [facility])    STEMI involving right coronary artery (HCC) 03/26/2019   DVT of proximal leg (deep vein thrombosis) (HCC) 09/07/2011   Factor V Leiden mutation (HCC) 09/07/2011    PCP: Perley Bradley, MD  REFERRING PROVIDER: Rance Burrows, MD  REFERRING DIAG: [id number] (ICD-10-CM) -  Right knee pain  THERAPY DIAG:  Chronic pain of right knee  Rationale for Evaluation and Treatment: Rehabilitation  ONSET DATE: 8-10 years ago approximately   SUBJECTIVE:   SUBJECTIVE STATEMENT: Pt reports longstanding history of BIL knee pain - remote ACL repair on L knee. Reports about 10 years ago he was playing beach volleyball and injured his R knee, believes he hyperextended it. Felt immediate pain/swelling. Eventually sought an MRI but was unable to complete given some uncertainty about the compatibility of his IVC filter. He states since this incident he has had intermittent R knee pain. Generally, pt describes himself as fairly active with recreational activities and work around [employer], travels for work. Denies overt limitations due to his knee but does have increased time/difficulty with floor transfers, pain with prolonged standing/sitting and full ROM squats. Denies any issues w/ swelling, discoloration, N/T, or buckling. Does have some crepitus and popping at times.  He endorses cardiac and DVT hx but remains active without limitations, follows with cardiology and no current concerns that he is aware of.    PERTINENT HISTORY: CAD s/p stent 2020, hx MI, DVT, thyroid  disease  PAIN:  Are you having pain: none currently Location/description: R knee, occasional crepitus/popping. Mostly anterior/lateral, sometimes posterior - aggravating factors: standing for work activities (audits), prolonged sitting, sports (dirtbikes, football), running/sprinting  - Easing factors: stretching legs out    PRECAUTIONS: cardiac hx including MI 2020, DVT  hx IVC filter   WEIGHT BEARING RESTRICTIONS: No  FALLS:  Has patient fallen in last 6 months? No  LIVING ENVIRONMENT: Lives on 3 acres, does a lot of yardwork.  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   PLOF: Independent  PATIENT GOALS: be able to  self manage knees long term, maintain/preserve mobility/function   OBJECTIVE:  Note: Objective measures were completed at Evaluation unless otherwise noted.  DIAGNOSTIC FINDINGS:  No recent imaging available in EPIC - per paper referral, no acute findings on XR for R knee  PATIENT SURVEYS:  FOTO deferred given lack of reported limitation  COGNITION: Overall cognitive status: Within functional limits for tasks assessed     SENSATION: Denies sensory complaints  EDEMA:  Denies swelling complaints, nothing overt on visual inspection today   PALPATION: No TTP throughout R knee  LOWER EXTREMITY ROM:      Right eval Left eval  Hip flexion    Hip extension    Hip internal rotation    Hip external rotation    Knee extension A: 0   Knee flexion A: 136   (Blank rows = not tested) (Key: WFL = within functional limits not formally assessed, * = concordant pain, s = stiffness/stretching sensation, NT = not tested)  Comments:    LOWER EXTREMITY MMT:    MMT Right eval Left eval  Hip flexion 5 5  Hip abduction  4 4+  Hip internal rotation 4+ mild hip strain 5  Hip external rotation 5 5  Knee flexion 5 5  Knee extension 5 5  Ankle dorsiflexion 5 5   (Blank rows = not tested) (Key: WFL = within functional limits not formally assessed, * = concordant pain, s = stiffness/stretching sensation, NT = not tested)  Comments:    LOWER EXTREMITY SPECIAL TESTS:  Unremarkable varus/valgus testing BIL knees Negative lachman on RLE  Patellar mobility WNL, no crepitus Does have some crepitus at end range knee extension on R and with crossing his R leg over L Able to perform 20 SLR BIL without lag - on RLE reports increased anterior hip/thigh fatigue compared to LLE  FUNCTIONAL TESTS:  5xSTS: 6.50sec no UE support   BW squat: mild concordant knee pain at bottom of movement, mechanics grossly WNL (mild valgus but symmetrical)  GAIT: Distance walked: within clinic Assistive device  utilized: None Level of assistance: Complete Independence Comments: WNL                                                                                                                                TREATMENT DATE:  [facility] Adult PT Treatment:  DATE: 10/16/23 Therapeutic Exercise: Green band hip IR w/ ball as fulcrum x10 Squat w/ UE support; x10 past parallel with cues for appropriate ROM, positioning, BOS Side lunge x10 towards R with cues for foot positioning, appropriate ROM HEP education/handout, significant time spent w/ rationale for interventions and relevant anatomy/physiology     PATIENT EDUCATION:  Education details: Pt education on PT impairments, prognosis, and POC. Informed consent. Rationale for interventions, safe/appropriate HEP performance Person educated: Patient Education method: Explanation, Demonstration, Tactile cues, Verbal cues, and Handouts Education comprehension: verbalized understanding, returned demonstration, verbal cues required, tactile cues required, and needs further education    HOME EXERCISE PROGRAM: Access Code: [email or web address] URL: [email or web address] Date: 10/16/2023 Prepared by: Mayme Spearman  Exercises - Seated Hip Internal Rotation with Celeste Cola and Resistance  - 3-4 x weekly - 2-3 sets - 8-10 reps - Squat with Counter Support  - 3-4 x weekly - 2-3 sets - 8-10 reps - Side Lunge with Counter Support  - 3-4 x weekly - 2-3 sets - 8-10 reps  ASSESSMENT:  CLINICAL IMPRESSION: Patient is a pleasant 48 y.o. gentleman who was seen today for physical therapy evaluation and treatment for chronic R knee pain, generally non-limiting but does bother him with heavier activities. His XR was reportedly reassuring, and special tests for knee today are also reassuring for overall structural integrity. Mild hip weakness on R that may be contributing to symptoms, and concordant symptoms are provoked with full BW  squat although mechanics overall WNL. Anticipate he would benefit from hip/knee strengthening and progression of closed chain sagittal/frontal plane strengthening with aim of maximizing independence with long term HEP. Pt tolerates exam/HEP well with report of some concordant fatigue but no adverse events or increased pain on departure. Recommend trial of skilled PT to address aforementioned deficits with aim of maximizing functional tolerance and reducing pain with typical activities. Pt departs today's session in no acute distress, all voiced concerns/questions addressed appropriately from PT perspective.    OBJECTIVE IMPAIRMENTS: decreased activity tolerance, decreased mobility, decreased strength, and pain.   ACTIVITY LIMITATIONS: sitting, standing, squatting, and locomotion level  PARTICIPATION LIMITATIONS: community activity and occupation  PERSONAL FACTORS: Time since onset of injury/illness/exacerbation and 3+ comorbidities: CAD s/p stent 2020, hx MI, DVT, thyroid  disease  are also affecting patient's functional outcome.   REHAB POTENTIAL: Excellent  CLINICAL DECISION MAKING: Stable/uncomplicated  EVALUATION COMPLEXITY: Low   GOALS: Goals reviewed with patient? Yes  SHORT TERM GOALS: Target date: 11/13/2023 Pt will demonstrate appropriate understanding and performance of initially prescribed HEP in order to facilitate improved independence with management of symptoms.  Baseline: HEP provided on eval Goal status: INITIAL   2. Pt will be able to perform floor transfer w/o UE support and less than 2/10 pain on NPS in order to facilitate improved safety w/ mobility.  Baseline: not formally tested on eval, pt reports UE use and increased time/effort  Goal status: INITIAL  3. Pt will be able to perform full bodyweight squat without increase in pain in order to facilitate improved functional strength.  Baseline: mild concordant pain with full squat  Goal status: INITIAL   4. Pt will  demonstrate symmetrical hip MMT in order to facilitate improved functional strength.  Baseline: see MMT chart above  Goal status: INITIAL    PLAN:  PT FREQUENCY: 1-2 additional visits  PT DURATION: 4 weeks  PLANNED INTERVENTIONS: 97164- PT Re-evaluation, 97110-Therapeutic exercises, 97530- Therapeutic activity, [id number]- Neuromuscular re-education, 97535- Self Care, 54098- Manual therapy, [phone]- Gait training, Patient/Family  education, Balance training, Stair training, Taping, Dry Needling, Joint mobilization, Cryotherapy, and Moist heat  PLAN FOR NEXT SESSION: Review/update HEP PRN. Work on [employer] exercises as appropriate with emphasis on rotational hip strength, hip abduction strength. In closed chain recommend appropriate progression for ROM and WB in sagittal and frontal plane. Symptom modification strategies as indicated/appropriate.    Lovett Ruck PT, DPT 10/16/2023 4:20 PM

## 2023-10-16 ENCOUNTER — Encounter: Payer: Self-pay | Admitting: Physical Therapy

## 2023-10-16 ENCOUNTER — Ambulatory Visit: Payer: BC Managed Care – PPO | Attending: Sports Medicine | Admitting: Physical Therapy

## 2023-10-16 DIAGNOSIS — M25561 Pain in right knee: Secondary | ICD-10-CM | POA: Insufficient documentation

## 2023-10-16 DIAGNOSIS — G8929 Other chronic pain: Secondary | ICD-10-CM | POA: Insufficient documentation

## 2023-10-30 NOTE — Therapy (Signed)
OUTPATIENT PHYSICAL THERAPY TREATMENT   Patient Name: Randy Cannon MRN: [id number] DOB:01/02/1976, 48 y.o., male Today's Date: 10/31/2023  END OF SESSION:  PT End of Session - 10/31/23 1402     Visit Number 2    Number of Visits 4    Date for PT Re-Evaluation 11/13/23    Authorization Type BCBS    PT Start Time 1402    PT Stop Time 1450    PT Time Calculation (min) 48 min    Activity Tolerance Patient tolerated treatment well              Past Medical History:  Diagnosis Date   Coronary artery disease    DVT (deep venous thrombosis) (HCC)    DVT of proximal leg (deep vein thrombosis) (HCC) 09/07/2011   Factor V Leiden mutation (HCC) 09/07/2011   Hyperlipidemia    Thyroid disease    Past Surgical History:  Procedure Laterality Date   CARDIAC CATHETERIZATION     CORONARY STENT INTERVENTION N/A 03/27/2019   Procedure: CORONARY STENT INTERVENTION;  Surgeon: Lennette Bihari, MD;  Location: [facility] INVASIVE CV LAB;  Service: Cardiovascular;  Laterality: N/A;   CORONARY/GRAFT ACUTE MI REVASCULARIZATION N/A 03/25/2019   Procedure: Coronary/Graft Acute MI Revascularization;  Surgeon: Lennette Bihari, MD;  Location: [facility] INVASIVE CV LAB;  Service: Cardiovascular;  Laterality: N/A;   LEFT HEART CATH AND CORONARY ANGIOGRAPHY N/A 03/25/2019   Procedure: LEFT HEART CATH AND CORONARY ANGIOGRAPHY;  Surgeon: Lennette Bihari, MD;  Location: [facility] INVASIVE CV LAB;  Service: Cardiovascular;  Laterality: N/A;   Patient Active Problem List   Diagnosis Date Noted   Hyperlipidemia LDL goal <70 06/29/2019   Coronary artery disease involving native coronary artery of native heart with unstable angina pectoris [facility])    STEMI involving right coronary artery (HCC) 03/26/2019   DVT of proximal leg (deep vein thrombosis) (HCC) 09/07/2011   Factor V Leiden mutation (HCC) 09/07/2011    PCP: Sigmund Hazel, MD  REFERRING PROVIDER: Hurman Horn, MD  REFERRING DIAG: [phone] (ICD-10-CM) - Right knee  pain  THERAPY DIAG:  Chronic pain of right knee  Rationale for Evaluation and Treatment: Rehabilitation  ONSET DATE: 8-10 years ago approximately   SUBJECTIVE:  Per eval - Pt reports longstanding history of BIL knee pain - remote ACL repair on L knee. Reports about 10 years ago he was playing beach volleyball and injured his R knee, believes he hyperextended it. Felt immediate pain/swelling. Eventually sought an MRI but was unable to complete given some uncertainty about the compatibility of his IVC filter. He states since this incident he has had intermittent R knee pain. Generally, pt describes himself as fairly active with recreational activities and work around [employer], travels for work. Denies overt limitations due to his knee but does have increased time/difficulty with floor transfers, pain with prolonged standing/sitting and full ROM squats. Denies any issues w/ swelling, discoloration, N/T, or buckling. Does have some crepitus and popping at times.  He endorses cardiac and DVT hx but remains active without limitations, follows with cardiology and no current concerns that he is aware of.   SUBJECTIVE STATEMENT: 10/31/2023 pt states he has been doing well since initial eval. Notes the exercises have been helpful, feels some muscular fatigue with them. Has been doing HEP diligently, feels some occasional soreness at end of day.    PERTINENT HISTORY: CAD s/p stent 2020, hx MI, DVT, thyroid disease  PAIN:  Are you having pain: none currently Location/description:  R knee, occasional crepitus/popping. Mostly anterior/lateral, sometimes posterior - aggravating factors: standing for work activities (audits), prolonged sitting, sports (dirtbikes, football), running/sprinting  - Easing factors: stretching legs out    PRECAUTIONS: cardiac hx including MI 2020, DVT hx IVC filter   WEIGHT BEARING RESTRICTIONS: No  FALLS:  Has patient fallen in last 6 months? No  LIVING  ENVIRONMENT: Lives on 3 acres, does a lot of yardwork.  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   PLOF: Independent  PATIENT GOALS: be able to self manage knees long term, maintain/preserve mobility/function   OBJECTIVE:  Note: Objective measures were completed at Evaluation unless otherwise noted.  DIAGNOSTIC FINDINGS:  No recent imaging available in EPIC - per paper referral, no acute findings on XR for R knee  PATIENT SURVEYS:  FOTO deferred given lack of reported limitation  COGNITION: Overall cognitive status: Within functional limits for tasks assessed     SENSATION: Denies sensory complaints  EDEMA:  Denies swelling complaints, nothing overt on visual inspection today   PALPATION: No TTP throughout R knee  LOWER EXTREMITY ROM:      Right eval Left eval  Hip flexion    Hip extension    Hip internal rotation    Hip external rotation    Knee extension A: 0   Knee flexion A: 136   (Blank rows = not tested) (Key: WFL = within functional limits not formally assessed, * = concordant pain, s = stiffness/stretching sensation, NT = not tested)  Comments:    LOWER EXTREMITY MMT:    MMT Right eval Left eval  Hip flexion 5 5  Hip abduction  4 4+  Hip internal rotation 4+ mild hip strain 5  Hip external rotation 5 5  Knee flexion 5 5  Knee extension 5 5  Ankle dorsiflexion 5 5   (Blank rows = not tested) (Key: WFL = within functional limits not formally assessed, * = concordant pain, s = stiffness/stretching sensation, NT = not tested)  Comments:    LOWER EXTREMITY SPECIAL TESTS:  Unremarkable varus/valgus testing BIL knees Negative lachman on RLE  Patellar mobility WNL, no crepitus Does have some crepitus at end range knee extension on R and with crossing his R leg over L Able to perform 20 SLR BIL without lag - on RLE reports increased anterior hip/thigh fatigue compared to  LLE  FUNCTIONAL TESTS:  5xSTS: 6.50sec no UE support   BW squat: mild concordant knee pain at bottom of movement, mechanics grossly WNL (mild valgus but symmetrical)  GAIT: Distance walked: within clinic Assistive device utilized: None Level of assistance: Complete Independence Comments: WNL                                                                                                                                TREATMENT DATE:  [facility] Adult PT Treatment:  DATE: 10/31/23 Therapeutic Exercise: BW squat x5, sumo x8 cues for BOS  BW hinge x10 10# KB pickup hinge x10, squat x10 Lateral lunging x12 BIL  Runner step up x12 BIL Hooklying clams x25 blue band  Hooklying blue band march x12 BIL Sidelying blue band hip IR HEP update + education/handout; increased time spent w/ education/discussion re: independent progression of exercises with respect to symptom tolerance   [facility] Adult PT Treatment:                                                DATE: 10/16/23 Therapeutic Exercise: Green band hip IR w/ ball as fulcrum x10 Squat w/ UE support; x10 past parallel with cues for appropriate ROM, positioning, BOS Side lunge x10 towards R with cues for foot positioning, appropriate ROM HEP education/handout, significant time spent w/ rationale for interventions and relevant anatomy/physiology     PATIENT EDUCATION:  Education details: rationale for interventions, HEP  Person educated: Patient Education method: Explanation, Demonstration, Tactile cues, Verbal cues Education comprehension: verbalized understanding, returned demonstration, verbal cues required, tactile cues required, and needs further education     HOME EXERCISE PROGRAM: Access Code: [email or web address] URL: [email or web address] Date: 10/16/2023 Prepared by: Fransisco Hertz  Exercises - Seated Hip Internal Rotation with [employer] P[employer] and Resistance  - 3-4 x weekly - 2-3 sets - 8-10  reps - Squat with Counter Support  - 3-4 x weekly - 2-3 sets - 8-10 reps - Side Lunge with Counter Support  - 3-4 x weekly - 2-3 sets - 8-10 reps  ASSESSMENT:  CLINICAL IMPRESSION: 10/31/2023 Pt arrived w/o overt pain, reporting good tolerance with HEP from initial eval. Today focused on expansion of program to address closed chain LE strengthening in multiple planes. Also worked on Animal nutritionist and comfortable variations to promote increased self efficacy with program going forward. Increased time spent w/ education on rationale for interventions, relevant anatomy/physiology, and strategies to appropriately monitor and self progress program. Pt tolerated activities quite well, endorsed some muscular fatigue but no pain. Stated for now he would like to trial independent HEP, plan to remain on caseload until end of currently certified POC in case pt would like to follow up re: today's program. Did discuss 30 day discharge policy and follow up with provider as needed. Pt verbalized agreement/understanding with plan, departed session in no acute distress.   Per eval - Patient is a pleasant 48 y.o. gentleman who was seen today for physical therapy evaluation and treatment for chronic R knee pain, generally non-limiting but does bother him with heavier activities. His XR was reportedly reassuring, and special tests for knee today are also reassuring for overall structural integrity. Mild hip weakness on R that may be contributing to symptoms, and concordant symptoms are provoked with full BW squat although mechanics overall WNL. Anticipate he would benefit from hip/knee strengthening and progression of closed chain sagittal/frontal plane strengthening with aim of maximizing independence with long term HEP. Pt tolerates exam/HEP well with report of some concordant fatigue but no adverse events or increased pain on departure. Recommend trial of skilled PT to address aforementioned deficits with aim of  maximizing functional tolerance and reducing pain with typical activities. Pt departs today's session in no acute distress, all voiced concerns/questions addressed appropriately from PT perspective.    OBJECTIVE IMPAIRMENTS: decreased activity tolerance, decreased mobility, decreased strength, and pain.  ACTIVITY LIMITATIONS: sitting, standing, squatting, and locomotion level  PARTICIPATION LIMITATIONS: community activity and occupation  PERSONAL FACTORS: Time since onset of injury/illness/exacerbation and 3+ comorbidities: CAD s/p stent 2020, hx MI, DVT, thyroid disease  are also affecting patient's functional outcome.   REHAB POTENTIAL: Excellent  CLINICAL DECISION MAKING: Stable/uncomplicated  EVALUATION COMPLEXITY: Low   GOALS: Goals reviewed with patient? Yes  SHORT TERM GOALS: Target date: 11/13/2023 Pt will demonstrate appropriate understanding and performance of initially prescribed HEP in order to facilitate improved independence with management of symptoms.  Baseline: HEP provided on eval Goal status: INITIAL   2. Pt will be able to perform floor transfer w/o UE support and less than 2/10 pain on NPS in order to facilitate improved safety w/ mobility.  Baseline: not formally tested on eval, pt reports UE use and increased time/effort  Goal status: INITIAL  3. Pt will be able to perform full bodyweight squat without increase in pain in order to facilitate improved functional strength.  Baseline: mild concordant pain with full squat  Goal status: INITIAL   4. Pt will demonstrate symmetrical hip MMT in order to facilitate improved functional strength.  Baseline: see MMT chart above  Goal status: INITIAL    PLAN:  PT FREQUENCY: 1-2 additional visits  PT DURATION: 4 weeks  PLANNED INTERVENTIONS: 97164- PT Re-evaluation, 97110-Therapeutic exercises, 97530- Therapeutic activity, 97112- Neuromuscular re-education, 97535- Self Care, 52841- Manual therapy, [phone]- Gait  training, Patient/Family education, Balance training, Stair training, Taping, Dry Needling, Joint mobilization, Cryotherapy, and Moist heat  PLAN FOR NEXT SESSION: Review/update HEP PRN. Work on [employer] exercises as appropriate with emphasis on rotational hip strength, hip abduction strength. In closed chain recommend appropriate progression for ROM and WB in sagittal and frontal plane. Symptom modification strategies as indicated/appropriate.    Ashley Murrain PT, DPT 10/31/2023 5:13 PM

## 2023-10-31 ENCOUNTER — Ambulatory Visit: Payer: BC Managed Care – PPO | Admitting: Physical Therapy

## 2023-10-31 ENCOUNTER — Encounter: Payer: Self-pay | Admitting: Physical Therapy

## 2023-10-31 DIAGNOSIS — G8929 Other chronic pain: Secondary | ICD-10-CM

## 2023-10-31 DIAGNOSIS — M25561 Pain in right knee: Secondary | ICD-10-CM | POA: Diagnosis not present

## 2023-11-07 ENCOUNTER — Encounter: Payer: BC Managed Care – PPO | Admitting: Physical Therapy

## 2023-11-18 DIAGNOSIS — M25561 Pain in right knee: Secondary | ICD-10-CM | POA: Diagnosis not present

## 2023-11-18 DIAGNOSIS — M25522 Pain in left elbow: Secondary | ICD-10-CM | POA: Diagnosis not present

## 2023-12-25 DIAGNOSIS — E291 Testicular hypofunction: Secondary | ICD-10-CM | POA: Diagnosis not present

## 2023-12-25 DIAGNOSIS — D6851 Activated protein C resistance: Secondary | ICD-10-CM | POA: Diagnosis not present

## 2023-12-25 DIAGNOSIS — E039 Hypothyroidism, unspecified: Secondary | ICD-10-CM | POA: Diagnosis not present

## 2023-12-25 DIAGNOSIS — Z0000 Encounter for general adult medical examination without abnormal findings: Secondary | ICD-10-CM | POA: Diagnosis not present

## 2023-12-25 DIAGNOSIS — E7800 Pure hypercholesterolemia, unspecified: Secondary | ICD-10-CM | POA: Diagnosis not present

## 2023-12-25 DIAGNOSIS — Z6834 Body mass index (BMI) 34.0-34.9, adult: Secondary | ICD-10-CM | POA: Diagnosis not present

## 2024-01-24 DIAGNOSIS — F4322 Adjustment disorder with anxiety: Secondary | ICD-10-CM | POA: Diagnosis not present

## 2024-01-31 DIAGNOSIS — F4322 Adjustment disorder with anxiety: Secondary | ICD-10-CM | POA: Diagnosis not present

## 2024-02-17 ENCOUNTER — Ambulatory Visit: Admitting: Cardiovascular Disease

## 2024-02-21 DIAGNOSIS — F4322 Adjustment disorder with anxiety: Secondary | ICD-10-CM | POA: Diagnosis not present

## 2024-03-06 DIAGNOSIS — F4322 Adjustment disorder with anxiety: Secondary | ICD-10-CM | POA: Diagnosis not present

## 2024-03-26 ENCOUNTER — Ambulatory Visit: Admitting: Cardiovascular Disease

## 2024-03-27 ENCOUNTER — Ambulatory Visit: Admitting: Physician Assistant

## 2024-03-27 DIAGNOSIS — F4322 Adjustment disorder with anxiety: Secondary | ICD-10-CM | POA: Diagnosis not present

## 2024-04-10 NOTE — Progress Notes (Unsigned)
 Cardiology Office Note:    Date:  04/14/2024   ID:  Randy Cannon, DOB December 12, 1975, MRN [id number]  PCP:  Cleotilde Planas, MD   [facility] Providers Cardiologist:  Alm Clay, MD Cardiology APP:  Madie Jon Garre, [place]     Referring MD: Cleotilde Planas, MD   Chief Complaint  Patient presents with   Follow-up    CAD    History of Present Illness:    Randy Cannon is a 48 y.o. male with a hx of CAD s/p NSTEMI with DES-RCA, DES-diagonal, PCI-RPDA in 2020. He also has a history of DVT with factor V Leiden mutation on xarelto , HLD, and hypothyroidism.  Echo at the time of NSTEMI in 2020 with low normal LVEF and moderately reduced RV systolic function.   He presents for routine cardiology follow up. He is active with pushmowing 30 min and weed eats for 2 hrs. No cardiac complaints. Long discussion regarding CAD and HLD - needs to stay on statin and repatha .    Past Medical History:  Diagnosis Date   Coronary artery disease    DVT (deep venous thrombosis) (HCC)    DVT of proximal leg (deep vein thrombosis) (HCC) 09/07/2011   Factor V Leiden mutation (HCC) 09/07/2011   Hyperlipidemia    Thyroid  disease     Past Surgical History:  Procedure Laterality Date   CARDIAC CATHETERIZATION     CORONARY STENT INTERVENTION N/A 03/27/2019   Procedure: CORONARY STENT INTERVENTION;  Surgeon: Burnard Debby LABOR, MD;  Location: [facility]IVE [facility];  Service: Cardiovascular;  Laterality: N/A;   CORONARY/GRAFT ACUTE MI REVASCULARIZATION N/A 03/25/2019   Procedure: Coronary/Graft Acute MI Revascularization;  Surgeon: Burnard Debby LABOR, MD;  Location: [facility] INVASIVE CV LAB;  Service: Cardiovascular;  Laterality: N/A;   LEFT HEART CATH AND CORONARY ANGIOGRAPHY N/A 03/25/2019   Procedure: LEFT HEART CATH AND CORONARY ANGIOGRAPHY;  Surgeon: Burnard Debby LABOR, MD;  Location: [facility] INV[facility] CV [facility];  Service: Cardiovascular;  Laterality: N/A;    Current Medications: Current Meds  Medication Sig    acetaminophen  (TYLENOL ) 325 MG tablet Take 325-650 mg by mouth every 8 (eight) hours as needed for mild pain, moderate pain or headache.    aspirin  EC 81 MG tablet Take 81 mg by mouth daily. Swallow whole.   cetirizine (ZYRTEC) 10 MG tablet Take 10 mg by mouth daily as needed (for seasonal allergies).    levothyroxine  (SYNTHROID ) 112 MCG tablet Take 112 mcg by mouth every morning.   [DISCONTINUED] atorvastatin  (LIPITOR ) 80 MG tablet TAKE 1 TABLET DAILY AT 6 P.M. (PLEASE SCHEDULE AN APPOINTMENT FOR FUTURE REFILLS)   [DISCONTINUED] Evolocumab  (REPATHA  SURECLICK) 140 MG/ML SOAJ ADMINISTER 1 ML UNDER THE SKIN EVERY 14 DAYS   [DISCONTINUED] nitroGLYCERIN  (NITROSTAT ) 0.4 MG SL tablet Place 1 tablet (0.4 mg total) under the tongue every 5 (five) minutes as needed for chest pain.   [DISCONTINUED] rivaroxaban  (XARELTO ) 20 MG TABS tablet TAKE 1 TABLET DAILY WITH SUPPER     Allergies:   Patient has no known allergies.   Social History   Socioeconomic History   Marital status: Married    Spouse name: Not on file   Number of children: 1   Years of education: 16   Highest education level: Not on file  Occupational History   Not on file  Tobacco Use   Smoking status: Former    Current packs/day: 0.00    Types: Cigarettes    Quit date: 2014    Years since  quitting: 11.5   Smokeless tobacco: Former    Quit date: 2023  Substance and Sexual Activity   Alcohol use: Yes   Drug use: Not Currently   Sexual activity: Not on file  Other Topics Concern   Not on file  Social History Narrative   Not on file   Social Drivers of Health   Financial Resource Strain: Low Risk  (03/27/2019)   Overall Financial Resource Strain (CARDIA)    Difficulty of Paying Living Expenses: Not hard at all  Food Insecurity: No Food Insecurity (03/27/2019)   Hunger Vital Sign    Worried About Running Out of Food in the Last Year: Never true    Ran Out of Food in the Last Year: Never true  Transportation Needs: No  Transportation Needs (03/27/2019)   PRAPARE - Administrator, Civil Service (Medical): No    Lack of Transportation (Non-Medical): No  Physical Activity: Sufficiently Active (04/30/2019)   Exercise Vital Sign    Days of Exercise per Week: 7 days    Minutes of Exercise per Session: 30 min  Stress: Stress Concern Present (04/30/2019)   [employer] of Occupational Health - Occupational Stress Questionnaire    Feeling of Stress : To some extent  Social Connections: Unknown (02/11/2022)   Received from [facility]   Social Network    Social Network: Not on file     Family History: The patient's family history includes Factor V Leiden deficiency in his father; Heart attack (age of onset: 57) in his paternal grandfather; Hyperlipidemia in his father.  ROS:   Please see the history of present illness.     All other systems reviewed and are negative.  EKGs/Labs/Other Studies Reviewed:    The following studies were reviewed today:  EKG Interpretation Date/Time:  Tuesday April 14 2024 14:40:26 EDT Ventricular Rate:  53 PR Interval:  182 QRS Duration:  120 QT Interval:  432 QTC Calculation: 405 R Axis:   -4  Text Interpretation: Sinus bradycardia Inferior infarct (cited on or before 26-Mar-2019) When compared with ECG of 16-Apr-2023 14:33, No significant change was found Confirmed by Madie Slough ([id number]) on 04/14/2024 2:52:06 PM    Recent Labs: 04/23/2023: ALT 48  Recent Lipid Panel    Component Value Date/Time   CHOL 89 (L) 05/21/2023 1102   TRIG 160 (H) 05/21/2023 1102   HDL 37 (L) 05/21/2023 1102   CHOLHDL 2.4 05/21/2023 1102   CHOLHDL 4.2 03/25/2019 2217   VLDL 20 03/25/2019 2217   LDLCALC 25 05/21/2023 1102     Risk Assessment/Calculations:                Physical Exam:    VS:  BP 116/76   Pulse (!) 53   Ht 5' 11 (1.803 m)   Wt 246 lb 12.8 oz (111.9 kg)   SpO2 95%   BMI 34.42 kg/m     Wt Readings from Last 3 Encounters:  04/14/24 246 lb  12.8 oz (111.9 kg)  04/16/23 240 lb (108.9 kg)  10/25/21 240 lb (108.9 kg)     GEN:  Well nourished, well developed in no acute distress HEENT: Normal NECK: No JVD; No carotid bruits LYMPHATICS: No lymphadenopathy CARDIAC: RRR, no murmurs, rubs, gallops RESPIRATORY:  Clear to auscultation without rales, wheezing or rhonchi  ABDOMEN: Soft, non-tender, non-distended MUSCULOSKELETAL:  No edema; No deformity  SKIN: Warm and dry NEUROLOGIC:  Alert and oriented x 3 PSYCHIATRIC:  Normal affect   ASSESSMENT:  1. Coronary artery disease involving native coronary artery of native heart with unstable angina pectoris (HCC)   2. Deep vein thrombosis (DVT) of proximal vein of right lower extremity, unspecified chronicity (HCC)   3. Hyperlipidemia LDL goal <70   4. Deep vein thrombosis (DVT) of proximal lower extremity, unspecified chronicity, unspecified laterality (HCC)   5. STEMI involving right coronary artery (HCC)   6. Hyperlipidemia with target LDL less than 70   7. History of DVT (deep vein thrombosis)   8. Screening for diabetes mellitus    PLAN:    In order of problems listed above:  CAD  DES-RCA, DES-diagonal, PCI-RPDA - 2020 - on ASA monotherapy in the setting of xarelto  - no chest pain   Low-normal LVEF Moderate RV reduction - no CHF symptoms, good activity tolerance   Hyperlipidemia with LDL goal < 70 05/21/2023: Cholesterol, Total 89; HDL 37; LDL Chol Calc (NIH) 25; Triglycerides 160 Remains on lipitor  and repatha  Repeat lipid panel 11/2024 : Total chol 112 LDL 60 HDL 37 Trig 71 Will check LPA today - given disease, would be a good candidate for new LPA medications when available - he reports compliance   Hypertension - monitoring at home, controlled here   Hx of DVT Factor V Leiden mutation - on xarelto  - managed by PCP   Prior Dr. Burnard patient. Will re-assign to Dr. Anner today ad DOD.    Medication Adjustments/Labs and Tests Ordered: Current  medicines are reviewed at length with the patient today.  Concerns regarding medicines are outlined above.  Orders Placed This Encounter  Procedures   Lipoprotein A (LPA)   HgB A1c   EKG 12-Lead   Meds ordered this encounter  Medications   DISCONTD: atorvastatin  (LIPITOR ) 80 MG tablet    Sig: TAKE 1 TABLET DAILY AT 6 P.M. (PLEASE SCHEDULE AN APPOINTMENT FOR FUTURE REFILLS)    Dispense:  90 tablet    Refill:  3   rivaroxaban  (XARELTO ) 20 MG TABS tablet    Sig: Take 1 tablet (20 mg total) by mouth daily.    Dispense:  90 tablet    Refill:  3   DISCONTD: Evolocumab  (REPATHA  SURECLICK) 140 MG/ML SOAJ    Sig: Inject 140 mg as directed every 14 (fourteen) days.    Dispense:  6 mL    Refill:  3   DISCONTD: nitroGLYCERIN  (NITROSTAT ) 0.4 MG SL tablet    Sig: Place 1 tablet (0.4 mg total) under the tongue every 5 (five) minutes as needed for chest pain.    Dispense:  25 tablet    Refill:  3   Evolocumab  (REPATHA  SURECLICK) 140 MG/ML SOAJ    Sig: Inject 140 mg as directed every 14 (fourteen) days.    Dispense:  6 mL    Refill:  3   atorvastatin  (LIPITOR ) 80 MG tablet    Sig: Take one tablet daily at 6 PM    Dispense:  90 tablet    Refill:  3   nitroGLYCERIN  (NITROSTAT ) 0.4 MG SL tablet    Sig: Place 1 tablet (0.4 mg total) under the tongue every 5 (five) minutes as needed for chest pain.    Dispense:  25 tablet    Refill:  3    Patient Instructions  Medication Instructions:  The current medical regimen is effective;  continue present plan and medications.  *If you need a refill on your cardiac medications before your next appointment, please call your pharmacy*  Lab Work: LPA A1C If you  have labs (blood work) drawn today and your tests are completely normal, you will receive your results only by: MyChart Message (if you have MyChart) OR A paper copy in the mail If you have any lab test that is abnormal or we need to change your treatment, we will call you to review the  results.  Testing/Procedures: NONE  Follow-Up: At [facility], you and your health needs are our priority.  As part of our continuing mission to provide you with exceptional heart care, our providers are all part of one team.  This team includes your primary Cardiologist (physician) and Advanced Practice Providers or APPs (Physician Assistants and Nurse Practitioners) who all work together to provide you with the care you need, when you need it.  Your next appointment:   1 Year   Provider:   Alm Clay, MD          Signed, Jon Garre Avella, [place]  04/14/2024 3:44 PM     [facility]

## 2024-04-14 ENCOUNTER — Ambulatory Visit: Attending: Cardiology | Admitting: Physician Assistant

## 2024-04-14 ENCOUNTER — Ambulatory Visit: Admitting: Physician Assistant

## 2024-04-14 ENCOUNTER — Encounter: Payer: Self-pay | Admitting: Physician Assistant

## 2024-04-14 VITALS — BP 116/76 | HR 53 | Ht 71.0 in | Wt 246.8 lb

## 2024-04-14 DIAGNOSIS — I25110 Atherosclerotic heart disease of native coronary artery with unstable angina pectoris: Secondary | ICD-10-CM | POA: Diagnosis not present

## 2024-04-14 DIAGNOSIS — I824Y1 Acute embolism and thrombosis of unspecified deep veins of right proximal lower extremity: Secondary | ICD-10-CM | POA: Diagnosis not present

## 2024-04-14 DIAGNOSIS — Z131 Encounter for screening for diabetes mellitus: Secondary | ICD-10-CM

## 2024-04-14 DIAGNOSIS — I824Y9 Acute embolism and thrombosis of unspecified deep veins of unspecified proximal lower extremity: Secondary | ICD-10-CM

## 2024-04-14 DIAGNOSIS — Z7901 Long term (current) use of anticoagulants: Secondary | ICD-10-CM | POA: Diagnosis not present

## 2024-04-14 DIAGNOSIS — I2111 ST elevation (STEMI) myocardial infarction involving right coronary artery: Secondary | ICD-10-CM

## 2024-04-14 DIAGNOSIS — Z86718 Personal history of other venous thrombosis and embolism: Secondary | ICD-10-CM

## 2024-04-14 DIAGNOSIS — E785 Hyperlipidemia, unspecified: Secondary | ICD-10-CM | POA: Diagnosis not present

## 2024-04-14 MED ORDER — RIVAROXABAN 20 MG PO TABS: 20.0000 mg | ORAL_TABLET | Freq: Every day | ORAL | 3 refills | Status: DC

## 2024-04-14 MED ORDER — NITROGLYCERIN 0.4 MG SL SUBL: 0.4000 mg | SUBLINGUAL_TABLET | SUBLINGUAL | 3 refills | Status: AC | PRN

## 2024-04-14 MED ORDER — ATORVASTATIN CALCIUM 80 MG PO TABS: ORAL_TABLET | ORAL | 3 refills | Status: AC

## 2024-04-14 MED ORDER — REPATHA SURECLICK 140 MG/ML ~~LOC~~ SOAJ: 140.0000 mg | SUBCUTANEOUS | 3 refills | Status: DC

## 2024-04-14 MED ORDER — NITROGLYCERIN 0.4 MG SL SUBL: 0.4000 mg | SUBLINGUAL_TABLET | SUBLINGUAL | 3 refills | Status: DC | PRN

## 2024-04-14 MED ORDER — ATORVASTATIN CALCIUM 80 MG PO TABS: ORAL_TABLET | ORAL | 3 refills | Status: DC

## 2024-04-14 NOTE — Patient Instructions (Signed)
 Medication Instructions:  The current medical regimen is effective;  continue present plan and medications.  *If you need a refill on your cardiac medications before your next appointment, please call your pharmacy*  Lab Work: LPA A1C If you have labs (blood work) drawn today and your tests are completely normal, you will receive your results only by: MyChart Message (if you have MyChart) OR A paper copy in the mail If you have any lab test that is abnormal or we need to change your treatment, we will call you to review the results.  Testing/Procedures: NONE  Follow-Up: At [facility], you and your health needs are our priority.  As part of our continuing mission to provide you with exceptional heart care, our providers are all part of one team.  This team includes your primary Cardiologist (physician) and Advanced Practice Providers or APPs (Physician Assistants and Nurse Practitioners) who all work together to provide you with the care you need, when you need it.  Your next appointment:   1 Year   Provider:   Alm Clay, MD

## 2024-04-14 NOTE — Addendum Note (Signed)
 Addended by: MANDA BOTTCHER B on: 04/14/2024 04:00 PM   Modules accepted: Orders

## 2024-04-17 DIAGNOSIS — E785 Hyperlipidemia, unspecified: Secondary | ICD-10-CM | POA: Diagnosis not present

## 2024-04-17 DIAGNOSIS — Z131 Encounter for screening for diabetes mellitus: Secondary | ICD-10-CM | POA: Diagnosis not present

## 2024-04-17 DIAGNOSIS — I25110 Atherosclerotic heart disease of native coronary artery with unstable angina pectoris: Secondary | ICD-10-CM | POA: Diagnosis not present

## 2024-04-19 LAB — HEMOGLOBIN A1C
Est. average glucose Bld gHb Est-mCnc: 114 mg/dL
Hgb A1c MFr Bld: 5.6 % (ref 4.8–5.6)

## 2024-04-19 LAB — LIPOPROTEIN A (LPA): Lipoprotein (a): 11.8 nmol/L (ref <75.0)

## 2024-04-23 ENCOUNTER — Ambulatory Visit: Payer: Self-pay | Admitting: Physician Assistant

## 2024-05-01 DIAGNOSIS — F4322 Adjustment disorder with anxiety: Secondary | ICD-10-CM | POA: Diagnosis not present

## 2024-05-04 MED ORDER — RIVAROXABAN 20 MG PO TABS: 20.0000 mg | ORAL_TABLET | Freq: Every day | ORAL | 1 refills | Status: DC

## 2024-05-22 DIAGNOSIS — F4322 Adjustment disorder with anxiety: Secondary | ICD-10-CM | POA: Diagnosis not present

## 2024-06-12 DIAGNOSIS — F4322 Adjustment disorder with anxiety: Secondary | ICD-10-CM | POA: Diagnosis not present

## 2024-06-24 DIAGNOSIS — L821 Other seborrheic keratosis: Secondary | ICD-10-CM | POA: Diagnosis not present

## 2024-06-24 DIAGNOSIS — D225 Melanocytic nevi of trunk: Secondary | ICD-10-CM | POA: Diagnosis not present

## 2024-06-24 DIAGNOSIS — L570 Actinic keratosis: Secondary | ICD-10-CM | POA: Diagnosis not present

## 2024-06-24 DIAGNOSIS — L918 Other hypertrophic disorders of the skin: Secondary | ICD-10-CM | POA: Diagnosis not present

## 2024-06-26 DIAGNOSIS — F4322 Adjustment disorder with anxiety: Secondary | ICD-10-CM | POA: Diagnosis not present

## 2024-07-10 DIAGNOSIS — F4322 Adjustment disorder with anxiety: Secondary | ICD-10-CM | POA: Diagnosis not present

## 2024-07-24 DIAGNOSIS — F4322 Adjustment disorder with anxiety: Secondary | ICD-10-CM | POA: Diagnosis not present

## 2024-08-31 DIAGNOSIS — L918 Other hypertrophic disorders of the skin: Secondary | ICD-10-CM | POA: Diagnosis not present

## 2024-08-31 DIAGNOSIS — L821 Other seborrheic keratosis: Secondary | ICD-10-CM | POA: Diagnosis not present

## 2024-10-27 ENCOUNTER — Other Ambulatory Visit: Payer: Self-pay | Admitting: Cardiology

## 2024-10-27 DIAGNOSIS — I824Y9 Acute embolism and thrombosis of unspecified deep veins of unspecified proximal lower extremity: Secondary | ICD-10-CM

## 2024-10-27 DIAGNOSIS — D6851 Activated protein C resistance: Secondary | ICD-10-CM

## 2024-10-28 NOTE — Telephone Encounter (Signed)
 Xarelto  20mg  refill request received. Pt is 49 years old, weight-111.9kg, Crea-0.86 on 12/27/23 via [facility] from [place], last seen by Jon Hails on 04/14/24, Diagnosis-DVT & Factor V, CrCl-162.65 mL/min; Dose is appropriate based on dosing criteria. Will send in refill to requested pharmacy.

## 2024-11-02 ENCOUNTER — Other Ambulatory Visit: Payer: Self-pay

## 2024-11-02 DIAGNOSIS — I2111 ST elevation (STEMI) myocardial infarction involving right coronary artery: Secondary | ICD-10-CM

## 2024-11-02 DIAGNOSIS — E785 Hyperlipidemia, unspecified: Secondary | ICD-10-CM

## 2024-11-03 ENCOUNTER — Encounter: Payer: Self-pay | Admitting: Cardiology

## 2024-11-03 DIAGNOSIS — I2111 ST elevation (STEMI) myocardial infarction involving right coronary artery: Secondary | ICD-10-CM

## 2024-11-03 DIAGNOSIS — E785 Hyperlipidemia, unspecified: Secondary | ICD-10-CM

## 2024-11-03 MED ORDER — REPATHA SURECLICK 140 MG/ML ~~LOC~~ SOAJ: 140.0000 mg | SUBCUTANEOUS | 3 refills | Status: AC
